# Patient Record
Sex: Male | Born: 1980 | Race: White | Hispanic: No | Marital: Married | Smoking: Never smoker
Health system: Southern US, Community
[De-identification: ages and names within clinical notes are randomized; demographics above are authoritative.]

## PROBLEM LIST (undated history)

## (undated) DIAGNOSIS — B019 Varicella without complication: Secondary | ICD-10-CM

## (undated) DIAGNOSIS — F419 Anxiety disorder, unspecified: Secondary | ICD-10-CM

## (undated) DIAGNOSIS — K760 Fatty (change of) liver, not elsewhere classified: Secondary | ICD-10-CM

## (undated) DIAGNOSIS — Z87442 Personal history of urinary calculi: Secondary | ICD-10-CM

## (undated) DIAGNOSIS — K5790 Diverticulosis of intestine, part unspecified, without perforation or abscess without bleeding: Secondary | ICD-10-CM

## (undated) HISTORY — PX: KNEE ARTHROSCOPY: SHX127

## (undated) HISTORY — DX: Varicella without complication: B01.9

## (undated) HISTORY — PX: CHOLECYSTECTOMY: SHX55

---
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
## 2007-06-21 ENCOUNTER — Emergency Department: Payer: Self-pay | Admitting: Emergency Medicine

## 2007-06-24 ENCOUNTER — Ambulatory Visit: Payer: Self-pay | Admitting: Unknown Physician Specialty

## 2009-02-06 ENCOUNTER — Ambulatory Visit: Payer: Self-pay | Admitting: Pain Medicine

## 2009-03-29 ENCOUNTER — Ambulatory Visit: Payer: Self-pay | Admitting: Unknown Physician Specialty

## 2011-10-09 ENCOUNTER — Emergency Department: Payer: Self-pay | Admitting: Emergency Medicine

## 2014-12-19 ENCOUNTER — Encounter (INDEPENDENT_AMBULATORY_CARE_PROVIDER_SITE_OTHER): Payer: Self-pay

## 2014-12-19 ENCOUNTER — Encounter: Payer: Self-pay | Admitting: Primary Care

## 2014-12-19 ENCOUNTER — Ambulatory Visit (INDEPENDENT_AMBULATORY_CARE_PROVIDER_SITE_OTHER): Payer: 59 | Admitting: Primary Care

## 2014-12-19 VITALS — BP 142/102 | HR 78 | Temp 98.2°F | Ht 67.5 in | Wt 167.1 lb

## 2014-12-19 DIAGNOSIS — J302 Other seasonal allergic rhinitis: Secondary | ICD-10-CM

## 2014-12-19 NOTE — Progress Notes (Signed)
Subjective:    Patient ID: Carl Moore, male    DOB: [birth year], 34 y.o.   MRN: [id number]  HPI  Carl Moore is a 34 year old male who presents today to establish care and discuss the problems mentioned below. Last physical was in August 2015 at a health fair. He has not received care from a PCP in several years and does not recall where current records are located.  1) Elevated blood pressure reading: Elevated blood pressure today in office. Denies prior history. He checks his blood pressure at [facility] sporadically and will get 120's-130's/80. He admits to feeling nervous in clinics and medical institutes.   2) Seasonal Allergies: History of seasonal allergies and works outdoors as a Development worker, international aid. He reports chest congestion that has been present for the past 2 weeks. He's been taking Zyrtec-D with some relief but without resolve of symptoms. Reports productive cough in the morning with green sputum which turns to clear sputum throughout the day. He is a non smoker.  He's tried Claritin and Allegra in the past without help. He's not tried taking Mucinex.  3) Depression: Has felt depressed since the passing of his father in March 2015. He's able to talk with his mom and wife about his feelings. Denies SI/HI. PHQ 2 score of 0. He declines therapy.   Review of Systems  Constitutional: Negative for fever and chills.  HENT: Positive for congestion and postnasal drip. Negative for ear pain, rhinorrhea, sinus pressure, sneezing and sore throat.   Eyes: Positive for itching.  Respiratory: Negative for shortness of breath.   Cardiovascular: Negative for chest pain.  Gastrointestinal: Negative for nausea and vomiting.  Allergic/Immunologic: Positive for environmental allergies.       Past Medical History  Diagnosis Date  . Chicken pox     History   Social History  . Marital Status: Married    Spouse Name: N/A  . Number of Children: N/A  . Years of Education: N/A    Occupational History  . Not on file.   Social History Main Topics  . Smoking status: Never Smoker   . Smokeless tobacco: Not on file  . Alcohol Use: No  . Drug Use: No  . Sexual Activity: Not on file   Other Topics Concern  . 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   No children.   Enjoys playing golf.       Past Surgical History  Procedure Laterality Date  . Knee arthroscopy Left   . Ulnar nerve repair Left     Family History  Problem Relation Age of Onset  . Hypertension Mother   . Stroke Father     Deceased  . Hypertension Father     Allergies not on file  No current outpatient prescriptions on file prior to visit.   No current facility-administered medications on file prior to visit.    BP 142/102 mmHg  Pulse 78  Temp(Src) 98.2 F (36.8 C) (Oral)  Ht 5' 7.5" (1.715 m)  Wt 167 lb 1.9 oz (75.805 kg)  BMI 25.77 kg/m2  SpO2 99%    Objective:   Physical Exam  Constitutional: He is oriented to person, place, and time. He appears well-nourished.  HENT:  Right Ear: Tympanic membrane and ear canal normal.  Left Ear: Tympanic membrane and ear canal normal.  Nose: Nose normal.  Mouth/Throat: Oropharynx is clear and moist.  Eyes: Conjunctivae and EOM are normal.  Pupils are equal, round, and reactive to light.  Neck: Neck supple.  Cardiovascular: Normal rate and regular rhythm.   Pulmonary/Chest: Effort normal and breath sounds normal.  Abdominal: Bowel sounds are normal.  Lymphadenopathy:    He has no cervical adenopathy.  Neurological: He is alert and oriented to person, place, and time. He has normal reflexes.  Skin: Skin is warm and dry.  Psychiatric: He has a normal mood and affect.          Assessment & Plan:  Chest congestion:  Suspect allergy related due to lack of other systemic symptoms. Stop Zyrtec D, switch to allergra, zyrtec, or claritin. Start Mucinex DM with water. Call if no  improvement or development of fevers, chills, cough.

## 2014-12-19 NOTE — Patient Instructions (Signed)
Your blood pressure was elevated in our clinic today at 142/90. Continue checking your blood pressure at [facility] and call me if you consistently (more than 2 times) get anything over 140/90. Try switching from Zyrtec-D to Zyrtec. Take Mucinex DM over the counter for chest congestion. Take with a full glass of water. Please schedule a physical with me in August this year. You will also schedule a lab only appointment one week prior. We will discuss your lab results during your physical. It was a pleasure to meet you today! Please don't hesitate to call me with any questions. Welcome to [facility]!

## 2014-12-19 NOTE — Assessment & Plan Note (Signed)
Present every spring/summer. 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 Chest congestion today. Start Mucinex DM with water. Stop Zyrtec D, start plain Zyrtec/Allegra/Claritin daily during seasonal changes.

## 2014-12-19 NOTE — Progress Notes (Signed)
Pre visit review using our clinic review tool, if applicable. No additional management support is needed unless otherwise documented below in the visit note. 

## 2014-12-28 ENCOUNTER — Ambulatory Visit (INDEPENDENT_AMBULATORY_CARE_PROVIDER_SITE_OTHER): Payer: 59 | Admitting: Primary Care

## 2014-12-28 ENCOUNTER — Encounter: Payer: Self-pay | Admitting: Primary Care

## 2014-12-28 VITALS — BP 152/72 | HR 78 | Temp 98.5°F | Ht 67.5 in | Wt 162.4 lb

## 2014-12-28 DIAGNOSIS — R5383 Other fatigue: Secondary | ICD-10-CM

## 2014-12-28 LAB — COMPREHENSIVE METABOLIC PANEL
ALBUMIN: 4.7 g/dL (ref 3.5–5.2)
ALT: 71 U/L — AB (ref 0–53)
AST: 43 U/L — AB (ref 0–37)
Alkaline Phosphatase: 98 U/L (ref 39–117)
BUN: 13 mg/dL (ref 6–23)
CALCIUM: 9.9 mg/dL (ref 8.4–10.5)
CO2: 32 mEq/L (ref 19–32)
CREATININE: 1.14 mg/dL (ref 0.40–1.50)
Chloride: 101 mEq/L (ref 96–112)
GFR: 77.44 mL/min (ref >60.00)
Glucose, Bld: 102 mg/dL — ABNORMAL HIGH (ref 70–99)
POTASSIUM: 4.1 meq/L (ref 3.5–5.1)
SODIUM: 138 meq/L (ref 135–145)
Total Bilirubin: 0.6 mg/dL (ref 0.2–1.2)
Total Protein: 7.7 g/dL (ref 6.0–8.3)

## 2014-12-28 LAB — CBC WITH DIFFERENTIAL/PLATELET
Basophils Absolute: 0 10*3/uL (ref 0.0–0.1)
Basophils Relative: 0.4 % (ref 0.0–3.0)
Eosinophils Absolute: 0.1 10*3/uL (ref 0.0–0.7)
Eosinophils Relative: 2 % (ref 0.0–5.0)
HCT: 48.8 % (ref 39.0–52.0)
Hemoglobin: 16.5 g/dL (ref 13.0–17.0)
LYMPHS ABS: 1.2 10*3/uL (ref 0.7–4.0)
Lymphocytes Relative: 18.9 % (ref 12.0–46.0)
MCHC: 33.8 g/dL (ref 30.0–36.0)
MCV: 86.9 fl (ref 78.0–100.0)
Monocytes Absolute: 0.4 10*3/uL (ref 0.1–1.0)
Monocytes Relative: 6.3 % (ref 3.0–12.0)
NEUTROS PCT: 72.4 % (ref 43.0–77.0)
Neutro Abs: 4.8 10*3/uL (ref 1.4–7.7)
Platelets: 368 10*3/uL (ref 150.0–400.0)
RBC: 5.62 Mil/uL (ref 4.22–5.81)
RDW: 12.6 % (ref 11.5–15.5)
WBC: 6.6 10*3/uL (ref 4.0–10.5)

## 2014-12-28 NOTE — Patient Instructions (Addendum)
Your influenza test was negative. You need to increase your daily consumption of water. You should be getting 3 liters daily.  You may take tylenol for fevers and body aches. Get some rest. Complete lab work prior to leaving today. I will notify you of your results.

## 2014-12-28 NOTE — Progress Notes (Signed)
   Subjective:    Patient ID: Carl Moore, male    DOB: 02-19-1981, 34 y.o.   MRN: [id number]  HPI  Carl Moore is a 34 year old male who presents today with a chief complaint of sudden onset of fatigue and body aches that occurred around 11:00 am at work yesterday. He reports body aches and chills have continued but has not checked his temerature. He received the influenza vaccine last season. 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 He's taken tylenol yesterday which improved his body aches. He denies cough, sore throat, ear pain, sinus pressure.   Review of Systems  Constitutional: Positive for chills and diaphoresis. Negative for fever.  HENT: Negative for congestion, ear pain, postnasal drip and sore throat.   Respiratory: Negative for cough and shortness of breath.   Cardiovascular: Negative for chest pain.  Musculoskeletal: Positive for myalgias.  Neurological: Positive for light-headedness. Negative for headaches.       Past Medical History  Diagnosis Date  . Chicken pox     History   Social History  . Marital Status: Married    Spouse Name: N/A  . Number of Children: N/A  . Years of Education: N/A   Occupational History  . Not on file.   Social History Main Topics  . Smoking status: Never Smoker   . Smokeless tobacco: Not on file  . Alcohol Use: No  . Drug Use: No  . Sexual Activity: Not on file   Other Topics Concern  . 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   No children.   Enjoys playing golf.       Past Surgical History  Procedure Laterality Date  . Knee arthroscopy Left   . Ulnar nerve repair Left     Family History  Problem Relation Age of Onset  . Hypertension Mother   . Stroke Father     Deceased  . Hypertension Father     No Known Allergies  No current outpatient prescriptions on file prior to visit.   No current facility-administered medications on file  prior to visit.    BP 152/72 mmHg  Pulse 78  Temp(Src) 98.5 F (36.9 C) (Oral)  Ht 5' 7.5" (1.715 m)  Wt 162 lb 6.4 oz (73.664 kg)  BMI 25.05 kg/m2  SpO2 98%    Objective:   Physical Exam  Constitutional: He appears well-nourished. He appears ill.  HENT:  Right Ear: Tympanic membrane and ear canal normal.  Left Ear: Tympanic membrane and ear canal normal.  Nose: Right sinus exhibits no maxillary sinus tenderness and no frontal sinus tenderness. Left sinus exhibits no maxillary sinus tenderness and no frontal sinus tenderness.  Mouth/Throat: Oropharynx is clear and moist.  Neck: Neck supple.  Cardiovascular: Normal rate and regular rhythm.   Pulmonary/Chest: Effort normal and breath sounds normal.  Lymphadenopathy:    He has no cervical adenopathy.  Skin: Skin is warm and dry.          Assessment & Plan:  Fatigue:  Suspect viral involvement. Influenza test: Negative Increase consumption of water  Tylenol for body aches and fevers. Rest. CBC and CMP today to rule out other disorders.

## 2014-12-28 NOTE — Progress Notes (Signed)
Pre visit review using our clinic review tool, if applicable. No additional management support is needed unless otherwise documented below in the visit note. 

## 2015-01-05 ENCOUNTER — Other Ambulatory Visit: Payer: Self-pay | Admitting: Occupational Medicine

## 2015-01-05 ENCOUNTER — Ambulatory Visit: Payer: Self-pay

## 2015-01-05 DIAGNOSIS — R208 Other disturbances of skin sensation: Secondary | ICD-10-CM

## 2015-03-10 ENCOUNTER — Other Ambulatory Visit: Payer: Self-pay | Admitting: Primary Care

## 2015-03-10 DIAGNOSIS — Z0000 Encounter for general adult medical examination without abnormal findings: Secondary | ICD-10-CM

## 2015-03-16 ENCOUNTER — Other Ambulatory Visit (INDEPENDENT_AMBULATORY_CARE_PROVIDER_SITE_OTHER): Payer: 59

## 2015-03-16 DIAGNOSIS — Z0000 Encounter for general adult medical examination without abnormal findings: Secondary | ICD-10-CM | POA: Diagnosis not present

## 2015-03-16 LAB — LIPID PANEL
CHOL/HDL RATIO: 4
CHOLESTEROL: 191 mg/dL (ref 0–200)
HDL: 43.1 mg/dL (ref >39.00)
LDL Cholesterol: 124 mg/dL — ABNORMAL HIGH (ref 0–99)
NonHDL: 148.1
TRIGLYCERIDES: 120 mg/dL (ref 0.0–149.0)
VLDL: 24 mg/dL (ref 0.0–40.0)

## 2015-03-16 LAB — COMPREHENSIVE METABOLIC PANEL
ALBUMIN: 4.5 g/dL (ref 3.5–5.2)
ALT: 38 U/L (ref 0–53)
AST: 23 U/L (ref 0–37)
Alkaline Phosphatase: 91 U/L (ref 39–117)
BUN: 16 mg/dL (ref 6–23)
CO2: 30 meq/L (ref 19–32)
Calcium: 9.5 mg/dL (ref 8.4–10.5)
Chloride: 104 mEq/L (ref 96–112)
Creatinine, Ser: 1.05 mg/dL (ref 0.40–1.50)
GFR: 85.04 mL/min (ref >60.00)
Glucose, Bld: 98 mg/dL (ref 70–99)
Potassium: 4.6 mEq/L (ref 3.5–5.1)
Sodium: 139 mEq/L (ref 135–145)
Total Bilirubin: 0.6 mg/dL (ref 0.2–1.2)
Total Protein: 7.3 g/dL (ref 6.0–8.3)

## 2015-03-16 LAB — CBC
HEMATOCRIT: 46.3 % (ref 39.0–52.0)
HEMOGLOBIN: 15.8 g/dL (ref 13.0–17.0)
MCHC: 34 g/dL (ref 30.0–36.0)
MCV: 87.4 fl (ref 78.0–100.0)
Platelets: 349 10*3/uL (ref 150.0–400.0)
RBC: 5.3 Mil/uL (ref 4.22–5.81)
RDW: 13 % (ref 11.5–15.5)
WBC: 6 10*3/uL (ref 4.0–10.5)

## 2015-03-16 LAB — HEMOGLOBIN A1C: Hgb A1c MFr Bld: 5.2 % (ref 4.6–6.5)

## 2015-03-23 ENCOUNTER — Encounter: Payer: 59 | Admitting: Primary Care

## 2015-03-24 ENCOUNTER — Ambulatory Visit (INDEPENDENT_AMBULATORY_CARE_PROVIDER_SITE_OTHER): Payer: 59 | Admitting: Primary Care

## 2015-03-24 ENCOUNTER — Encounter: Payer: Self-pay | Admitting: Primary Care

## 2015-03-24 VITALS — BP 126/76 | HR 61 | Temp 98.0°F | Ht 68.0 in | Wt 159.8 lb

## 2015-03-24 DIAGNOSIS — Z0000 Encounter for general adult medical examination without abnormal findings: Secondary | ICD-10-CM | POA: Diagnosis not present

## 2015-03-24 DIAGNOSIS — Z23 Encounter for immunization: Secondary | ICD-10-CM | POA: Diagnosis not present

## 2015-03-24 NOTE — Addendum Note (Signed)
Addended by: Jacqualin Combes on: 03/24/2015 08:06 AM   Modules accepted: Orders

## 2015-03-24 NOTE — Progress Notes (Signed)
Subjective:    Patient ID: Carl Moore, male    DOB: 15-Jan-1981, 34 y.o.   MRN: [id number]  HPI  Carl Moore is a 34 year old male who presents today for complete physical.  Immunizations: -Tetanus: Completed last year (2015) from the [place] -Influenza: Completed last season. Due today.   Diet: Endorses poor diet. Breakfast: Biscuit (bacon), fast food. Lunch: Grilled chicken, fast food (hamburger, chicken nuggets, fries) Dinner: Pizza, chicken (baked), steaks, limited vegetables. Snack: Ice cream, gummy bears, chips. Beverages: Drinks sweet tea and water. Limited soda  Exercise: He exercises in the gym daily and walks daily for his job. Eye exam: 2 years ago. Denies changes in vision. Dental exam: Completed 2 months ago.    Review of Systems  Constitutional: Negative for unexpected weight change.  HENT: Negative for rhinorrhea.   Respiratory: Negative for cough and shortness of breath.   Cardiovascular: Negative for chest pain.  Gastrointestinal: Negative for diarrhea and constipation.  Genitourinary: Negative for difficulty urinating.  Musculoskeletal: Negative for myalgias and arthralgias.  Skin: Negative for rash.  Neurological: Negative for dizziness, numbness and headaches.  Psychiatric/Behavioral:       Denies concerns for anxiety and depression       Past Medical History  Diagnosis Date  . Chicken pox     Social History   Social History  . Marital Status: Married    Spouse Name: N/A  . Number of Children: N/A  . Years of Education: N/A   Occupational History  . Not on file.   Social History Main Topics  . Smoking status: Never Smoker   . Smokeless tobacco: Not on file  . Alcohol Use: No  . Drug Use: No  . Sexual Activity: Not on file   Other Topics Concern  . 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   No children.   Enjoys playing golf.       Past Surgical  History  Procedure Laterality Date  . Knee arthroscopy Left   . Ulnar nerve repair Left     Family History  Problem Relation Age of Onset  . Hypertension Mother   . Stroke Father     Deceased  . Hypertension Father     No Known Allergies  No current outpatient prescriptions on file prior to visit.   No current facility-administered medications on file prior to visit.    BP 126/76 mmHg  Pulse 61  Temp(Src) 98 F (36.7 C) (Oral)  Ht 5\' 8"  (1.727 m)  Wt 159 lb 12.8 oz (72.485 kg)  BMI 24.30 kg/m2  SpO2 98%    Objective:   Physical Exam  Constitutional: He is oriented to person, place, and time. He appears well-nourished.  HENT:  Nose: Nose normal.  Mouth/Throat: Oropharynx is clear and moist.  Bilateral ears compacted with cerumen. Irrigation used to right canal, plastic curette used to left canal. TM's clear with visualization of BLM, positive light reflex post cerumen removal.  Eyes: Conjunctivae and EOM are normal. Pupils are equal, round, and reactive to light.  Neck: Neck supple.  Cardiovascular: Normal rate and regular rhythm.   Pulmonary/Chest: Effort normal and breath sounds normal.  Abdominal: Soft. Bowel sounds are normal. There is no tenderness.  Musculoskeletal: Normal range of motion.  Lymphadenopathy:    He has no cervical adenopathy.  Neurological: He is alert and oriented to person, place, and time. He has  normal reflexes. No cranial nerve deficit.  Skin: Skin is warm and dry.  Psychiatric: He has a normal mood and affect.          Assessment & Plan:

## 2015-03-24 NOTE — Patient Instructions (Signed)
You've been provided with a flu shot for this season.  Work to decrease fast food, fried foods, red meats.  Increase water consumption to 2 liters daily.  Follow up in 1 year or sooner if needed.  It was a pleasure to see you today!

## 2015-03-24 NOTE — Progress Notes (Signed)
Pre visit review using our clinic review tool, if applicable. No additional management support is needed unless otherwise documented below in the visit note. 

## 2015-03-24 NOTE — Assessment & Plan Note (Signed)
Tetanus UTD, flu provided today. Diet poor overall, discussed to reduce fast food/fried foods. Continue exercising. Labs mostly unremarkable, LDL borderline elevated. Exam unremarkable. Follow up in 1 year.

## 2015-04-13 ENCOUNTER — Encounter: Payer: Self-pay | Admitting: Primary Care

## 2015-04-13 ENCOUNTER — Ambulatory Visit (INDEPENDENT_AMBULATORY_CARE_PROVIDER_SITE_OTHER): Payer: 59 | Admitting: Primary Care

## 2015-04-13 ENCOUNTER — Telehealth: Payer: Self-pay | Admitting: Primary Care

## 2015-04-13 VITALS — BP 132/80 | HR 61 | Temp 97.8°F | Ht 68.0 in | Wt 164.0 lb

## 2015-04-13 DIAGNOSIS — R5383 Other fatigue: Secondary | ICD-10-CM

## 2015-04-13 LAB — CBC WITH DIFFERENTIAL/PLATELET
BASOS ABS: 0 10*3/uL (ref 0.0–0.1)
Basophils Relative: 0.5 % (ref 0.0–3.0)
EOS ABS: 0.2 10*3/uL (ref 0.0–0.7)
Eosinophils Relative: 2.8 % (ref 0.0–5.0)
HCT: 47.8 % (ref 39.0–52.0)
Hemoglobin: 16.3 g/dL (ref 13.0–17.0)
LYMPHS ABS: 1.3 10*3/uL (ref 0.7–4.0)
Lymphocytes Relative: 19.3 % (ref 12.0–46.0)
MCHC: 34.2 g/dL (ref 30.0–36.0)
MCV: 86.8 fl (ref 78.0–100.0)
Monocytes Absolute: 0.3 10*3/uL (ref 0.1–1.0)
Monocytes Relative: 4.8 % (ref 3.0–12.0)
NEUTROS ABS: 4.9 10*3/uL (ref 1.4–7.7)
NEUTROS PCT: 72.6 % (ref 43.0–77.0)
PLATELETS: 370 10*3/uL (ref 150.0–400.0)
RBC: 5.51 Mil/uL (ref 4.22–5.81)
RDW: 12.6 % (ref 11.5–15.5)
WBC: 6.8 10*3/uL (ref 4.0–10.5)

## 2015-04-13 LAB — MONONUCLEOSIS SCREEN: Mono Screen: NEGATIVE

## 2015-04-13 NOTE — Progress Notes (Signed)
Subjective:    Patient ID: Carl Moore, male    DOB: 23-Jan-1981, 34 y.o.   MRN: [id number]  HPI  Carl Moore is a 34 year old male who presents today with a chief complaint of fatigue. He's been feeling tired, sleeping, and has not felt physically able to work. His fatigue began on Tuesday and has remained throughout the week. He also reports a decrease in appetite. Denies cough, fevers, sore throat, weight loss. Overall his symptoms are about the same.   Review of Systems  Constitutional: Positive for appetite change. Negative for fever and chills.  HENT: Positive for postnasal drip. Negative for congestion, ear pain, sinus pressure and sore throat.   Respiratory: Negative for cough and shortness of breath.   Cardiovascular: Negative for chest pain.  Gastrointestinal: Negative for nausea, vomiting and abdominal pain.  Musculoskeletal: Negative for myalgias.  Neurological: Negative for dizziness and headaches.       Past Medical History  Diagnosis Date  . Chicken pox     Social History   Social History  . Marital Status: Married    Spouse Name: N/A  . Number of Children: N/A  . Years of Education: N/A   Occupational History  . Not on file.   Social History Main Topics  . Smoking status: Never Smoker   . Smokeless tobacco: Not on file  . Alcohol Use: No  . Drug Use: No  . Sexual Activity: Not on file   Other Topics Concern  . 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   No children.   Enjoys playing golf.       Past Surgical History  Procedure Laterality Date  . Knee arthroscopy Left   . Ulnar nerve repair Left     Family History  Problem Relation Age of Onset  . Hypertension Mother   . Stroke Father     Deceased  . Hypertension Father     No Known Allergies  No current outpatient prescriptions on file prior to visit.   No current facility-administered medications on file prior to visit.    BP  132/80 mmHg  Pulse 61  Temp(Src) 97.8 F (36.6 C) (Oral)  Ht 5\' 8"  (1.727 m)  Wt 164 lb (74.39 kg)  BMI 24.94 kg/m2  SpO2 98%    Objective:   Physical Exam  Constitutional: He is oriented to person, place, and time. He appears well-nourished. He does not appear ill.  HENT:  Right Ear: Tympanic membrane and ear canal normal.  Left Ear: Tympanic membrane and ear canal normal.  Nose: Right sinus exhibits no maxillary sinus tenderness and no frontal sinus tenderness. Left sinus exhibits no maxillary sinus tenderness and no frontal sinus tenderness.  Mouth/Throat: Oropharynx is clear and moist.  Eyes: Conjunctivae are normal. Pupils are equal, round, and reactive to light.  Neck: Neck supple.  Cardiovascular: Normal rate and regular rhythm.   Pulmonary/Chest: Effort normal and breath sounds normal.  Lymphadenopathy:    He has no cervical adenopathy.  Neurological: He is alert and oriented to person, place, and time.  Skin: Skin is warm and dry.          Assessment & Plan:  Fatigue:  Present since Tuesday this week, continues. No cough, fevers, body aches, sore throat, abdominal pain, rash. Exam unremarkable.  Will check CBC with diff and monospot to rule out other causes. Rest, fluids, tylenol if develop fevers or body  aches. Follow up if no improvement in 1 week.

## 2015-04-13 NOTE — Progress Notes (Signed)
Pre visit review using our clinic review tool, if applicable. No additional management support is needed unless otherwise documented below in the visit note. 

## 2015-04-13 NOTE — Patient Instructions (Signed)
Complete lab work prior to leaving today. I will notify you of your results.  Ensure that you are drinking plenty of water, about 2 liters daily. Rest.   You may take tylenol if you develop any body aches or fevers. Call me if no improvement in 1 week.   It was a pleasure to see you today!

## 2015-04-13 NOTE — Telephone Encounter (Signed)
Patient had lab work done this morning and he'd like to be called back with the results.

## 2015-04-13 NOTE — Telephone Encounter (Signed)
Please notify Carl Moore that he does not have Mono or signs of bacterial or viral infections. He is to rest and stay hydrated over the next several days. If he's had no improvement in one week, please have him call us. Thanks.

## 2015-04-14 NOTE — Telephone Encounter (Signed)
Called and notified patient of Kate's comments. Patient verbalized understanding.  

## 2015-05-14 ENCOUNTER — Encounter (HOSPITAL_COMMUNITY): Payer: Self-pay

## 2015-05-14 ENCOUNTER — Emergency Department (HOSPITAL_COMMUNITY)
Admission: EM | Admit: 2015-05-14 | Discharge: 2015-05-15 | Disposition: A | Discharge status: Home / Self Care | Payer: 59 | Attending: Emergency Medicine | Admitting: Emergency Medicine

## 2015-05-14 DIAGNOSIS — Z8619 Personal history of other infectious and parasitic diseases: Secondary | ICD-10-CM | POA: Diagnosis not present

## 2015-05-14 DIAGNOSIS — N201 Calculus of ureter: Secondary | ICD-10-CM | POA: Diagnosis not present

## 2015-05-14 DIAGNOSIS — R1031 Right lower quadrant pain: Secondary | ICD-10-CM | POA: Diagnosis present

## 2015-05-14 DIAGNOSIS — R109 Unspecified abdominal pain: Secondary | ICD-10-CM

## 2015-05-14 LAB — COMPREHENSIVE METABOLIC PANEL
ALBUMIN: 4.3 g/dL (ref 3.5–5.0)
ALT: 34 U/L (ref 17–63)
ANION GAP: 8 (ref 5–15)
AST: 25 U/L (ref 15–41)
Alkaline Phosphatase: 88 U/L (ref 38–126)
BUN: 12 mg/dL (ref 6–20)
CHLORIDE: 97 mmol/L — AB (ref 101–111)
CO2: 30 mmol/L (ref 22–32)
Calcium: 9.2 mg/dL (ref 8.9–10.3)
Creatinine, Ser: 0.94 mg/dL (ref 0.61–1.24)
GFR calc Af Amer: >60 mL/min (ref >60)
GLUCOSE: 112 mg/dL — AB (ref 65–99)
POTASSIUM: 3.7 mmol/L (ref 3.5–5.1)
Sodium: 135 mmol/L (ref 135–145)
TOTAL PROTEIN: 7.1 g/dL (ref 6.5–8.1)
Total Bilirubin: 0.4 mg/dL (ref 0.3–1.2)

## 2015-05-14 LAB — URINALYSIS, ROUTINE W REFLEX MICROSCOPIC
BILIRUBIN URINE: NEGATIVE
Glucose, UA: NEGATIVE mg/dL
Ketones, ur: NEGATIVE mg/dL
LEUKOCYTES UA: NEGATIVE
NITRITE: NEGATIVE
PH: 6 (ref 5.0–8.0)
Protein, ur: NEGATIVE mg/dL
SPECIFIC GRAVITY, URINE: 1.013 (ref 1.005–1.030)
UROBILINOGEN UA: 0.2 mg/dL (ref 0.0–1.0)

## 2015-05-14 LAB — CBC
HEMATOCRIT: 43.4 % (ref 39.0–52.0)
HEMOGLOBIN: 15.2 g/dL (ref 13.0–17.0)
MCH: 30.3 pg (ref 26.0–34.0)
MCHC: 35 g/dL (ref 30.0–36.0)
MCV: 86.6 fL (ref 78.0–100.0)
Platelets: 348 10*3/uL (ref 150–400)
RBC: 5.01 MIL/uL (ref 4.22–5.81)
RDW: 12.3 % (ref 11.5–15.5)
WBC: 7.9 10*3/uL (ref 4.0–10.5)

## 2015-05-14 LAB — URINE MICROSCOPIC-ADD ON

## 2015-05-14 LAB — LIPASE, BLOOD: LIPASE: 28 U/L (ref 22–51)

## 2015-05-14 MED ORDER — ONDANSETRON HCL 4 MG/2ML IJ SOLN
4.0000 mg | Freq: Once | INTRAMUSCULAR | Status: AC
  Administered 2015-05-15: 4 mg via INTRAVENOUS
  Filled 2015-05-14: qty 2

## 2015-05-14 MED ORDER — HYDROMORPHONE HCL 1 MG/ML IJ SOLN
1.0000 mg | Freq: Once | INTRAMUSCULAR | Status: AC
  Administered 2015-05-15: 1 mg via INTRAVENOUS
  Filled 2015-05-14: qty 1

## 2015-05-14 NOTE — ED Notes (Signed)
Onset 8:30p sudden RLQ pain.  No other s/s noted.

## 2015-05-14 NOTE — ED Provider Notes (Signed)
CSN: [id number]   Arrival date & time 05/14/15 2146  History  By signing my name below, I, Altamease Oiler, attest that this documentation has been prepared under the direction and in the presence of Ripley Fraise, MD. Electronically Signed: Altamease Oiler, ED Scribe. 05/15/2015. 12:12 AM.  Chief Complaint  Patient presents with  . Abdominal Pain    HPI Patient is a 34 y.o. male presenting with groin pain. The history is provided by the patient. No language interpreter was used.  Groin Pain This is a new problem. The current episode started 3 to 5 hours ago. The problem occurs constantly. The problem has not changed since onset.Associated symptoms include abdominal pain. Nothing aggravates the symptoms. Nothing relieves the symptoms. He has tried nothing for the symptoms.   BOE DEANS is a 34 y.o. male who presents to the Emergency Department complaining of constant right groin pain with sudden onset around 8:30 PM tonight. He notes that the pain starts in the right side of the groin and radiates to the RLQ. The pain is described as sharp and rated 8/10 in severity. Pt took nothing for pain PTA. He had similar pain in the past with kidney stones.  Associated symptoms include chills. Pt denies fever, nausea, vomiting, and difficulty with urination.   Past Medical History  Diagnosis Date  . Chicken pox     Past Surgical History  Procedure Laterality Date  . Knee arthroscopy Left   . Ulnar nerve repair Left     Family History  Problem Relation Age of Onset  . Hypertension Mother   . Stroke Father     Deceased  . Hypertension Father     Social History  Substance Use Topics  . Smoking status: Never Smoker   . Smokeless tobacco: None  . Alcohol Use: No     Review of Systems  Constitutional: Positive for chills. Negative for fever.  Gastrointestinal: Positive for abdominal pain. Negative for nausea and vomiting.  Genitourinary:       Right-sided groin pain  All other  systems reviewed and are negative.  Home Medications   Prior to Admission medications   Not on File    Allergies  Review of patient's allergies indicates no known allergies.  Triage Vitals: BP 143/84 mmHg  Pulse 82  Temp(Src) 97.9 F (36.6 C) (Oral)  Resp 16  Ht 5\' 6"  (1.676 m)  Wt 164 lb (74.39 kg)  BMI 26.48 kg/m2  SpO2 100%  Physical Exam CONSTITUTIONAL: Well developed/well nourished HEAD: Normocephalic/atraumatic EYES: EOMI/PERRL ENMT: Mucous membranes moist NECK: supple no meningeal signs SPINE/BACK:entire spine nontender CV: S1/S2 noted, no murmurs/rubs/gallops noted LUNGS: Lungs are clear to auscultation bilaterally, no apparent distress ABDOMEN: soft, nontender, no rebound or guarding, bowel sounds noted throughout abdomen GU:no cva tenderness, mild TTP to RLQ, no scrotal tenderness or edema, no Testicular tenderness, no hernia noted, chaperone present NEURO: Pt is awake/alert/appropriate, moves all extremitiesx4.  No facial droop.   EXTREMITIES: pulses normal/equal, full ROM SKIN: warm, color normal PSYCH: no abnormalities of mood noted, alert and oriented to situation  ED Course  Procedures Medications  ondansetron (ZOFRAN-ODT) disintegrating tablet 8 mg (8 mg Oral Not Given 05/15/15 0135)  HYDROmorphone (DILAUDID) injection 1 mg (not administered)  HYDROmorphone (DILAUDID) injection 1 mg (1 mg Intravenous Given 05/15/15 0057)  ondansetron (ZOFRAN) injection 4 mg (4 mg Intravenous Given 05/15/15 0057)  oxyCODONE-acetaminophen (PERCOCET/ROXICET) 5-325 MG per tablet 2 tablet (2 tablets Oral Given 05/15/15 0134)  ketorolac (TORADOL) 30 MG/ML injection  30 mg (30 mg Intravenous Given 05/15/15 0134)     DIAGNOSTIC STUDIES: Oxygen Saturation is 100% on RA, normal by my interpretation.    COORDINATION OF CARE: 11:48 PM Discussed treatment plan which includes lab work and pain management with pt at bedside and pt agreed to plan. 2:01 AM Pt with sharp low abd  pain that radiates to scrotum, reports similar to prior kidney stones After multiple meds, he is still in pain CT imaging ordered  Pt improved He would like to go home Will give pain meds and flomax Given urology info for f/u We discussed strict return precautions  Labs Reviewed  COMPREHENSIVE METABOLIC PANEL - Abnormal; Notable for the following:    Chloride 97 (*)    Glucose, Bld 112 (*)    All other components within normal limits  URINALYSIS, ROUTINE W REFLEX MICROSCOPIC (NOT AT [facility]) - Abnormal; Notable for the following:    Hgb urine dipstick LARGE (*)    All other components within normal limits  URINE MICROSCOPIC-ADD ON - Abnormal; Notable for the following:    Crystals CA OXALATE CRYSTALS (*)    All other components within normal limits  LIPASE, BLOOD  CBC    Imaging Review Ct Renal Stone Study  05/15/2015  CLINICAL DATA:  Abdominal and right groin pain for 7 hr. Hematuria. History of kidney stones. EXAM: CT ABDOMEN AND PELVIS WITHOUT CONTRAST TECHNIQUE: Multidetector CT imaging of the abdomen and pelvis was performed following the standard protocol without IV contrast. COMPARISON:  None. FINDINGS: Lower chest:  The included lung bases are clear. Liver: No evidence of focal lesion allowing for lack contrast. Hepatobiliary: Gallbladder is physiologically distended. No biliary dilatation. Pancreas: Normal. Spleen: Normal. Adrenal glands: No nodule. Kidneys: There is a 3 x 7 mm stone in the distal right ureter just proximal to the ureterovesicular junction. No associated hydroureteronephrosis of the right kidney. No additional nonobstructing stones in either kidney. The unenhanced left kidney appears normal. Stomach/Bowel: Stomach is distended with ingested contents. There are no dilated or thickened small bowel loops. Small volume of stool throughout the colon without colonic wall thickening. Diverticulosis of the sigmoid colon without diverticulitis. The appendix is normal.  Vascular/Lymphatic: No retroperitoneal adenopathy. Abdominal aorta is normal in caliber. Reproductive: Prostate gland is normal in size. Bladder: Physiologically distended without wall thickening or stone. Other: No free air, free fluid, or intra-abdominal fluid collection. No inguinal hernia. Musculoskeletal: Bilateral L5 pars interarticularis defects without listhesis. Vacuum phenomenon involving both sacroiliac joints. There are no acute or suspicious osseous abnormalities. IMPRESSION: 1. Right distal ureter 3 x 7 mm stone, however no back pressure changes or hydronephrosis. This may be partially or intermittently obstructing stone. 2. 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 Electronically Signed   By: Jeb Levering M.D.   On: 05/15/2015 02:37    I personally reviewed and evaluated these lab results as a part of my medical decision-making.    MDM   Final diagnoses:  Abdominal pain  Right ureteral stone     Nursing notes including past medical history and social history reviewed and considered in documentation Labs/vital reviewed myself and considered during evaluation   I, Sharyon Cable, personally performed the services described in this documentation. All medical record entries made by the scribe were at my direction and in my presence.  I have reviewed the chart and discharge instructions and agree that the record reflects my personal performance and is accurate and complete. Sharyon Cable.  05/15/2015.  4:54 AM.      Ripley Fraise, MD 05/15/15 [phone]

## 2015-05-15 ENCOUNTER — Emergency Department (HOSPITAL_COMMUNITY): Payer: 59

## 2015-05-15 MED ORDER — ONDANSETRON 4 MG PO TBDP: 8.0000 mg | ORAL_TABLET | Freq: Once | ORAL | Status: DC

## 2015-05-15 MED ORDER — TAMSULOSIN HCL 0.4 MG PO CAPS: 0.4000 mg | ORAL_CAPSULE | Freq: Every day | ORAL | Status: DC

## 2015-05-15 MED ORDER — ONDANSETRON 8 MG PO TBDP: ORAL_TABLET | ORAL | Status: DC

## 2015-05-15 MED ORDER — OXYCODONE-ACETAMINOPHEN 5-325 MG PO TABS
2.0000 | ORAL_TABLET | Freq: Once | ORAL | Status: AC
  Administered 2015-05-15: 2 via ORAL
  Filled 2015-05-15: qty 2

## 2015-05-15 MED ORDER — OXYCODONE-ACETAMINOPHEN 5-325 MG PO TABS: 2.0000 | ORAL_TABLET | ORAL | Status: DC | PRN

## 2015-05-15 MED ORDER — ONDANSETRON HCL 4 MG/2ML IJ SOLN
4.0000 mg | Freq: Once | INTRAMUSCULAR | Status: AC
  Administered 2015-05-15: 4 mg via INTRAVENOUS
  Filled 2015-05-15: qty 2

## 2015-05-15 MED ORDER — KETOROLAC TROMETHAMINE 30 MG/ML IJ SOLN
30.0000 mg | Freq: Once | INTRAMUSCULAR | Status: AC
  Administered 2015-05-15: 30 mg via INTRAVENOUS
  Filled 2015-05-15: qty 1

## 2015-05-15 MED ORDER — OXYCODONE-ACETAMINOPHEN 5-325 MG PO TABS: 1.0000 | ORAL_TABLET | ORAL | Status: DC | PRN

## 2015-05-15 MED ORDER — HYDROMORPHONE HCL 1 MG/ML IJ SOLN
1.0000 mg | Freq: Once | INTRAMUSCULAR | Status: AC
  Administered 2015-05-15: 1 mg via INTRAVENOUS
  Filled 2015-05-15: qty 1

## 2015-05-15 NOTE — ED Notes (Signed)
Pt. Left with all belongings and refused wheelchair. Discharge instructions were reviewed and all questions were answered.  

## 2015-05-15 NOTE — ED Notes (Signed)
Patient transported to CT 

## 2015-09-27 ENCOUNTER — Encounter: Payer: Self-pay | Admitting: Primary Care

## 2015-09-27 ENCOUNTER — Ambulatory Visit (INDEPENDENT_AMBULATORY_CARE_PROVIDER_SITE_OTHER): Payer: 59 | Admitting: Primary Care

## 2015-09-27 VITALS — BP 146/80 | HR 98 | Temp 97.9°F | Ht 68.0 in | Wt 170.8 lb

## 2015-09-27 DIAGNOSIS — R5383 Other fatigue: Secondary | ICD-10-CM

## 2015-09-27 LAB — POCT INFLUENZA A/B
INFLUENZA A, POC: NEGATIVE
Influenza B, POC: NEGATIVE

## 2015-09-27 NOTE — Patient Instructions (Signed)
Your flu test was negative, however, your symptoms do represent viral activity.  Nasal Congestion: Try using Flonase (fluticasone) nasal spray. Instill 2 sprays in each nostril once daily.   Cough/Congestion: Try taking Mucinex DM. This will help loosen up the mucous in your chest. Ensure you take this medication with a full glass of water.  Continue tylenol for body aches and headaches.  Ensure you are staying hydrated with water.  Please notify me if you develop persistent fevers of 101 or feel worse after 1 week of onset of symptoms.   It was a pleasure to see you today!  Upper Respiratory Infection, Adult Most upper respiratory infections (URIs) are a viral infection of the air passages leading to the lungs. A URI affects the nose, throat, and upper air passages. The most common type of URI is nasopharyngitis and is typically referred to as "the common cold." URIs run their course and usually go away on their own. Most of the time, a URI does not require medical attention, but sometimes a bacterial infection in the upper airways can follow a viral infection. This is called a secondary infection. Sinus and middle ear infections are common types of secondary upper respiratory infections. Bacterial pneumonia can also complicate a URI. A URI can worsen asthma and chronic obstructive pulmonary disease (COPD). Sometimes, these complications can require emergency medical care and may be life threatening.  CAUSES Almost all URIs are caused by viruses. A virus is a type of germ and can spread from one person to another.  RISKS FACTORS You may be at risk for a URI if:   You smoke.   You have chronic heart or lung disease.  You have a weakened defense (immune) system.   You are very young or very old.   You have nasal allergies or asthma.  You work in crowded or poorly ventilated areas.  You work in health care facilities or schools. SIGNS AND SYMPTOMS  Symptoms typically develop 2-3  days after you come in contact with a cold virus. Most viral URIs last 7-10 days. However, viral URIs from the influenza virus (flu virus) can last 14-18 days and are typically more severe. Symptoms may include:   Runny or stuffy (congested) nose.   Sneezing.   Cough.   Sore throat.   Headache.   Fatigue.   Fever.   Loss of appetite.   Pain in your forehead, behind your eyes, and over your cheekbones (sinus pain).  Muscle aches.  DIAGNOSIS  Your health care provider may diagnose a URI by:  Physical exam.  Tests to check that your symptoms are not due to another condition such as:  Strep throat.  Sinusitis.  Pneumonia.  Asthma. TREATMENT  A URI goes away on its own with time. It cannot be cured with medicines, but medicines may be prescribed or recommended to relieve symptoms. Medicines may help:  Reduce your fever.  Reduce your cough.  Relieve nasal congestion. HOME CARE INSTRUCTIONS   Take medicines only as directed by your health care provider.   Gargle warm saltwater or take cough drops to comfort your throat as directed by your health care provider.  Use a warm mist humidifier or inhale steam from a shower to increase air moisture. This may make it easier to breathe.  Drink enough fluid to keep your urine clear or pale yellow.   Eat soups and other clear broths and maintain good nutrition.   Rest as needed.   Return to work when  your temperature has returned to normal or as your health care provider advises. You may need to stay home longer to avoid infecting others. You can also use a face mask and careful hand washing to prevent spread of the virus.  Increase the usage of your inhaler if you have asthma.   Do not use any tobacco products, including cigarettes, chewing tobacco, or electronic cigarettes. If you need help quitting, ask your health care provider. PREVENTION  The best way to protect yourself from getting a cold is to  practice good hygiene.   Avoid oral or hand contact with people with cold symptoms.   Wash your hands often if contact occurs.  There is no clear evidence that vitamin C, vitamin E, echinacea, or exercise reduces the chance of developing a cold. However, it is always recommended to get plenty of rest, exercise, and practice good nutrition.  SEEK MEDICAL CARE IF:   You are getting worse rather than better.   Your symptoms are not controlled by medicine.   You have chills.  You have worsening shortness of breath.  You have brown or red mucus.  You have yellow or brown nasal discharge.  You have pain in your face, especially when you bend forward.  You have a fever.  You have swollen neck glands.  You have pain while swallowing.  You have white areas in the back of your throat. SEEK IMMEDIATE MEDICAL CARE IF:   You have severe or persistent:  Headache.  Ear pain.  Sinus pain.  Chest pain.  You have chronic lung disease and any of the following:  Wheezing.  Prolonged cough.  Coughing up blood.  A change in your usual mucus.  You have a stiff neck.  You have changes in your:  Vision.  Hearing.  Thinking.  Mood. MAKE SURE YOU:   Understand these instructions.  Will watch your condition.  Will get help right away if you are not doing well or get worse.   This information is not intended to replace advice given to you by your health care provider. Make sure you discuss any questions you have with your health care provider.   Document Released: 01/08/2001 Document Revised: 11/29/2014 Document Reviewed: 10/20/2013 Elsevier Interactive Patient Education [employer].

## 2015-09-27 NOTE — Progress Notes (Signed)
Subjective:    Patient ID: Carl Moore, male    DOB: 1981/01/20, 35 y.o.   MRN: [id number]  HPI  Mr. Khan is a 35 year old male who presents today with a chief complaint of fatigue. He also reports cough, chills, body aches, nasal congestion. His symptoms began yesterday. He was with a co-worker Monday who returned from being out of work for 1 week due to diagnosis of the flu. His co-worker was coughing in his truck as they were riding together.  Denies fevers, but states "i feel like I'm burning up". He's taken tylenol at home for his headache with improvement. His cough is non productive. His most bothersome symptom is fatigue.   Review of Systems  Constitutional: Positive for chills and fatigue. Negative for fever.  HENT: Positive for congestion, sinus pressure and sore throat.   Respiratory: Positive for cough. Negative for shortness of breath.   Cardiovascular: Negative for chest pain.  Musculoskeletal: Positive for myalgias.       Past Medical History  Diagnosis Date  . Chicken pox     Social History   Social History  . Marital Status: Married    Spouse Name: N/A  . Number of Children: N/A  . Years of Education: N/A   Occupational History  . Not on file.   Social History Main Topics  . Smoking status: Never Smoker   . Smokeless tobacco: Not on file  . Alcohol Use: No  . Drug Use: No  . Sexual Activity: Not on file   Other Topics Concern  . 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   No children.   Enjoys playing golf.       Past Surgical History  Procedure Laterality Date  . Knee arthroscopy Left   . Ulnar nerve repair Left     Family History  Problem Relation Age of Onset  . Hypertension Mother   . Stroke Father     Deceased  . Hypertension Father     No Known Allergies  Current Outpatient Prescriptions on File Prior to Visit  Medication Sig Dispense Refill  . cetirizine-pseudoephedrine  (ZYRTEC-D) 5-120 MG tablet Take 1 tablet by mouth 2 (two) times daily as needed for allergies.    Marland Kitchen ibuprofen (ADVIL,MOTRIN) 200 MG tablet Take 400 mg by mouth every 6 (six) hours as needed for moderate pain.     No current facility-administered medications on file prior to visit.    BP 146/80 mmHg  Pulse 98  Temp(Src) 97.9 F (36.6 C) (Oral)  Ht 5\' 8"  (1.727 m)  Wt 170 lb 12.8 oz (77.474 kg)  BMI 25.98 kg/m2  SpO2 99%    Objective:   Physical Exam  Constitutional: He appears well-nourished.  HENT:  Right Ear: Tympanic membrane and ear canal normal.  Left Ear: Tympanic membrane and ear canal normal.  Nose: Right sinus exhibits no maxillary sinus tenderness and no frontal sinus tenderness. Left sinus exhibits no maxillary sinus tenderness and no frontal sinus tenderness.  Mouth/Throat: Oropharynx is clear and moist.  Eyes: Conjunctivae are normal.  Neck: Neck supple.  Cardiovascular: Normal rate and regular rhythm.   Pulmonary/Chest: Effort normal and breath sounds normal. He has no wheezes. He has no rales.  Lymphadenopathy:    He has no cervical adenopathy.  Skin: Skin is warm and dry.          Assessment & Plan:  Fatigue:  Fatigue,  cough, nasal congestion, chills x 24 hours. Exposed to the flu from a co-worker he was in close contact with all day Monday who had symptoms. The co-worker was diagnosed 1 week ago. Exam mostly unremarkable. Lungs clear, HENT exam mostly normal. Rapid Flu: Negative Continue to suspect viral involvement and will treat with supportive measures.  Tylenol, flonase, mucinex, rest. Work note provided. Return precautions provided.

## 2015-09-27 NOTE — Progress Notes (Signed)
Pre visit review using our clinic review tool, if applicable. No additional management support is needed unless otherwise documented below in the visit note. 

## 2015-09-27 NOTE — Addendum Note (Signed)
Addended by: Jacqualin Combes on: 09/27/2015 10:28 AM   Modules accepted: Orders

## 2015-09-28 ENCOUNTER — Telehealth: Payer: Self-pay

## 2015-09-28 DIAGNOSIS — Z20828 Contact with and (suspected) exposure to other viral communicable diseases: Secondary | ICD-10-CM

## 2015-09-28 MED ORDER — OSELTAMIVIR PHOSPHATE 75 MG PO CAPS: 75.0000 mg | ORAL_CAPSULE | Freq: Every day | ORAL | Status: DC

## 2015-09-28 NOTE — Telephone Encounter (Signed)
Okay to send Tamiflu since he's in the 48 hour window. RX sent to [facility] as requested. Vallarie Mare, will you notify patient RX sent and update his work note? Thanks.

## 2015-09-28 NOTE — Telephone Encounter (Signed)
Called and notified patient of Kate's comments. Patient verbalized understanding. Left letter in front office for patient to pick up.

## 2015-09-28 NOTE — Telephone Encounter (Signed)
Pt left v/m; pt was seen 09/27/15; pt does not feel any better than he did on 09/27/15 and pt request another work note to be out of work 09/28/15. Pt also wants to know if [name] NP would prescribe tamiflu to [facility] [address]. Pt request cb.

## 2015-10-23 ENCOUNTER — Encounter: Payer: Self-pay | Admitting: Internal Medicine

## 2015-10-23 ENCOUNTER — Ambulatory Visit (INDEPENDENT_AMBULATORY_CARE_PROVIDER_SITE_OTHER): Payer: 59 | Admitting: Internal Medicine

## 2015-10-23 VITALS — BP 140/90 | HR 93 | Temp 98.6°F | Resp 12 | Wt 165.0 lb

## 2015-10-23 DIAGNOSIS — J069 Acute upper respiratory infection, unspecified: Secondary | ICD-10-CM | POA: Insufficient documentation

## 2015-10-23 MED ORDER — HYDROCODONE-HOMATROPINE 5-1.5 MG/5ML PO SYRP: 5.0000 mL | ORAL_SOLUTION | Freq: Every evening | ORAL | Status: DC | PRN

## 2015-10-23 NOTE — Progress Notes (Signed)
   Subjective:    Patient ID: Carl Moore, male    DOB: 1980-10-24, 35 y.o.   MRN: [id number]  HPI Here due to respiratory illness  Coughing for past week--but worse in past 2-3 days Fever to 100.7 two days ago. Better by last night Cough is in chest --- "unbearable". Persistent coughing at night---keeping him from sleeping Cough in day--but not as bad in day  Clear or light green mucus Chills and sweats 2 days ago No SOB No headache No body aches Some sore throat No ear pain  Tried robitussin cold and congestion-- no help Tried left over amoxil from wife yesterday  Current Outpatient Prescriptions on File Prior to Visit  Medication Sig Dispense Refill  . cetirizine-pseudoephedrine (ZYRTEC-D) 5-120 MG tablet Take 1 tablet by mouth 2 (two) times daily as needed for allergies.    Marland Kitchen ibuprofen (ADVIL,MOTRIN) 200 MG tablet Take 400 mg by mouth every 6 (six) hours as needed for moderate pain.     No current facility-administered medications on file prior to visit.    No Known Allergies  Past Medical History  Diagnosis Date  . Chicken pox     Past Surgical History  Procedure Laterality Date  . Knee arthroscopy Left   . Ulnar nerve repair Left     Family History  Problem Relation Age of Onset  . Hypertension Mother   . Stroke Father     Deceased  . Hypertension Father     Social History   Social History  . Marital Status: Married    Spouse Name: N/A  . Number of Children: N/A  . Years of Education: N/A   Occupational History  . Not on file.   Social History Main Topics  . Smoking status: Never Smoker   . Smokeless tobacco: Not on file  . Alcohol Use: No  . Drug Use: No  . Sexual Activity: Not on file   Other Topics Concern  . 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   No children.   Enjoys playing golf.      Review of Systems Did get over illness from 3 weeks ago--but never got over the cough No  rash No vomiting or diarrhea Appetite is off in past 2 days    Objective:   Physical Exam  Constitutional: He appears well-developed and well-nourished. No distress.  HENT:  Mouth/Throat: Oropharynx is clear and moist. No oropharyngeal exudate.  No sinus tenderness TMs okay--lots of cerumen Mild nasal inflammation   Neck: Normal range of motion. Neck supple.  Non tender bilateral anterior cervical nodes  Pulmonary/Chest: Effort normal and breath sounds normal. No respiratory distress. He has no wheezes. He has no rales.          Assessment & Plan:

## 2015-10-23 NOTE — Progress Notes (Signed)
Pre visit review using our clinic review tool, if applicable. No additional management support is needed unless otherwise documented below in the visit note. 

## 2015-10-23 NOTE — Assessment & Plan Note (Signed)
Still seems viral Had residual cough from earlier infection--now sick for a week No signs of pneumonia ?sinusitis  If not improving with supportive Rx--would send Rx for antibiotic later this week

## 2015-10-23 NOTE — Patient Instructions (Signed)
Send me a message if you aren't improving later in the week, I will send an antibiotic prescription for you.

## 2015-10-26 ENCOUNTER — Telehealth: Payer: Self-pay | Admitting: *Deleted

## 2015-10-26 MED ORDER — AMOXICILLIN 500 MG PO TABS: 1000.0000 mg | ORAL_TABLET | Freq: Two times a day (BID) | ORAL | Status: DC

## 2015-10-26 NOTE — Telephone Encounter (Signed)
Spoke to patient

## 2015-10-26 NOTE — Telephone Encounter (Signed)
Pt left vm and states that he recently saw Dr Silvio Pate. He was advised to contact office back should his cough not improve, and an abx would be sent. Pt states that he has had no improvement and is wanting abx sent to [facility]

## 2015-10-26 NOTE — Telephone Encounter (Signed)
Let him know I sent the prescription

## 2015-10-30 NOTE — Telephone Encounter (Signed)
It sounds like he should be seen again to reevaluate

## 2015-10-30 NOTE — Telephone Encounter (Signed)
Pt called back and pt is taking abx but cannot see any improvement with cough; prod cough with green phlegm; cough med at night is helping but cough during the day is rough;pt cannot take nighttime cough med during the day because makes pt sleepy. No fever, wheezing or SOB. pt is taking zyrtec during the day; no OTC med for cough being taken. [facility] [address] Please advise.

## 2015-10-30 NOTE — Telephone Encounter (Addendum)
Pt left v/m; pt is no better than when seen on 10/23/15 and pt wants to know what else can be done. Unable to reach pt or his wife for more info. [facility] [address].

## 2015-10-30 NOTE — Telephone Encounter (Signed)
Spoke to patient. Made appt for tomorrow at 1215.

## 2015-10-31 ENCOUNTER — Encounter: Payer: Self-pay | Admitting: Internal Medicine

## 2015-10-31 ENCOUNTER — Ambulatory Visit (INDEPENDENT_AMBULATORY_CARE_PROVIDER_SITE_OTHER)
Admission: RE | Admit: 2015-10-31 | Discharge: 2015-10-31 | Disposition: A | Discharge status: Home / Self Care | Payer: 59 | Source: Ambulatory Visit | Attending: Internal Medicine | Admitting: Internal Medicine

## 2015-10-31 ENCOUNTER — Ambulatory Visit (INDEPENDENT_AMBULATORY_CARE_PROVIDER_SITE_OTHER): Payer: 59 | Admitting: Internal Medicine

## 2015-10-31 VITALS — BP 136/80 | HR 90 | Temp 98.6°F | Resp 18 | Wt 166.0 lb

## 2015-10-31 DIAGNOSIS — R059 Cough, unspecified: Secondary | ICD-10-CM | POA: Insufficient documentation

## 2015-10-31 DIAGNOSIS — R05 Cough: Secondary | ICD-10-CM | POA: Diagnosis not present

## 2015-10-31 DIAGNOSIS — R051 Acute cough: Secondary | ICD-10-CM | POA: Insufficient documentation

## 2015-10-31 MED ORDER — LEVOFLOXACIN 500 MG PO TABS: 500.0000 mg | ORAL_TABLET | Freq: Every day | ORAL | Status: DC

## 2015-10-31 NOTE — Progress Notes (Signed)
Pre visit review using our clinic review tool, if applicable. No additional management support is needed unless otherwise documented below in the visit note. 

## 2015-10-31 NOTE — Assessment & Plan Note (Addendum)
Goes back a month No response with amoxil ?atypical infection CXR done---looks okay (will await the overread) Due to chronicity, will try levaquin

## 2015-10-31 NOTE — Progress Notes (Signed)
   Subjective:    Patient ID: Carl Moore, male    DOB: Nov 28, 1980, 35 y.o.   MRN: [id number]  HPI Here due to ongoing respiratory symptoms  Ongoing cough Lots in day but not as bad at night with cough syrup Started the antibiotic last week--no help No fever but still doesn't feel right Occasionally DOE--moving around No sweats or chills  Cough occasionally productive of green sputum. Clear at times No ear pain or sore throat No headache or bad head congestion Cough feels like in the chest  Tylenol also  Current Outpatient Prescriptions on File Prior to Visit  Medication Sig Dispense Refill  . amoxicillin (AMOXIL) 500 MG tablet Take 2 tablets (1,000 mg total) by mouth 2 (two) times daily. 40 tablet 0  . cetirizine-pseudoephedrine (ZYRTEC-D) 5-120 MG tablet Take 1 tablet by mouth 2 (two) times daily as needed for allergies.    Marland Kitchen HYDROcodone-homatropine (HYCODAN) 5-1.5 MG/5ML syrup Take 5 mLs by mouth at bedtime as needed for cough. 120 mL 0  . ibuprofen (ADVIL,MOTRIN) 200 MG tablet Take 400 mg by mouth every 6 (six) hours as needed for moderate pain.     No current facility-administered medications on file prior to visit.    No Known Allergies  Past Medical History  Diagnosis Date  . Chicken pox     Past Surgical History  Procedure Laterality Date  . Knee arthroscopy Left   . Ulnar nerve repair Left     Family History  Problem Relation Age of Onset  . Hypertension Mother   . Stroke Father     Deceased  . Hypertension Father     Social History   Social History  . Marital Status: Married    Spouse Name: N/A  . Number of Children: N/A  . Years of Education: N/A   Occupational History  . Not on file.   Social History Main Topics  . Smoking status: Never Smoker   . Smokeless tobacco: Not on file  . Alcohol Use: No  . Drug Use: No  . Sexual Activity: Not on file   Other Topics Concern  . Not on file   Social History Narrative   Works as  Development worker, international aid.   Married. Newly wed.   No children.   Enjoys playing golf.      Review of Systems  No rash No recent travel No vomiting or diarrhea Eating okay     Objective:   Physical Exam  Constitutional: He appears well-developed and well-nourished. No distress.  HENT:  No sinus tenderness TMs normal Slight pharyngeal injection Moderate nasal inflammation  Neck: Normal range of motion. Neck supple. No thyromegaly present.  Pulmonary/Chest: Effort normal. No respiratory distress. He has no wheezes. He has no rales.  ?slight decreased breath sounds at left base Otherwise normal No dullness          Assessment & Plan:

## 2016-04-26 ENCOUNTER — Ambulatory Visit (INDEPENDENT_AMBULATORY_CARE_PROVIDER_SITE_OTHER): Payer: 59 | Admitting: Primary Care

## 2016-04-26 VITALS — BP 136/84 | HR 84 | Temp 98.0°F | Ht 68.0 in | Wt 164.1 lb

## 2016-04-26 DIAGNOSIS — H9203 Otalgia, bilateral: Secondary | ICD-10-CM

## 2016-04-26 DIAGNOSIS — H6123 Impacted cerumen, bilateral: Secondary | ICD-10-CM | POA: Diagnosis not present

## 2016-04-26 NOTE — Progress Notes (Signed)
Pre visit review using our clinic review tool, if applicable. No additional management support is needed unless otherwise documented below in the visit note. 

## 2016-04-26 NOTE — Progress Notes (Signed)
   Subjective:    Patient ID: Carl Moore, male    DOB: [birth year], 35 y.o.   MRN: [id number]  HPI  Mr. Hertenstein is a 35 year old male who presents today with a chief complaint of ear pain. He also reports ear fullness. His pain is located to the right ear and has been present for the past several days. He's taken Zyrtec with some improvement. He does have a history of cerumen impaction. Denies fevers, cough, sore throat, sinus pressure.   Review of Systems  Constitutional: Negative for chills, fatigue and fever.  HENT: Positive for ear pain. Negative for congestion, sinus pressure and sore throat.   Respiratory: Negative for shortness of breath.   Cardiovascular: Negative for chest pain.  Neurological: Negative for headaches.       Objective:   Physical Exam  Constitutional: He appears well-nourished.  HENT:  Right Ear: Tympanic membrane and ear canal normal.  Left Ear: Tympanic membrane and ear canal normal.  Nose: No mucosal edema. Right sinus exhibits no maxillary sinus tenderness and no frontal sinus tenderness. Left sinus exhibits no maxillary sinus tenderness and no frontal sinus tenderness.  Mouth/Throat: Oropharynx is clear and moist.  Cerumen impaction to bilateral canals. Post irrigation TM's stable. Canals with mild irritation given adhesion of cerumen to skin. No acute infection.  Eyes: Conjunctivae are normal.  Neck: Neck supple.  Cardiovascular: Normal rate and regular rhythm.   Pulmonary/Chest: Effort normal and breath sounds normal. He has no wheezes. He has no rales.  Skin: Skin is warm and dry.          Assessment & Plan:  Otalgia:  Located to right ear x 3 days. Absence of other respiratory symptoms. Exam with bilateral cerumen impaction to canals. Post irrigation TM's stable. Canals with mild irritation given adhesion of cerumen to skin. No acute infection. Discussed use of Debrox drops in the future. Follow up as needed.  Sheral Flow, NP

## 2016-04-26 NOTE — Patient Instructions (Signed)
Your ears were impacted with ear wax.   Try using Debrox drops to soften any future wax.  It was a pleasure to see you today!  Cerumen Impaction The structures of the external ear canal secrete a waxy substance known as cerumen. Excess cerumen can build up in the ear canal, causing a condition known as cerumen impaction. Cerumen impaction can cause ear pain and disrupt the function of the ear. The rate of cerumen production differs for each individual. In certain individuals, the configuration of the ear canal may decrease his or her ability to naturally remove cerumen. CAUSES Cerumen impaction is caused by excessive cerumen production or buildup. RISK FACTORS  Frequent use of swabs to clean ears.  Having narrow ear canals.  Having eczema.  Being dehydrated. SIGNS AND SYMPTOMS  Diminished hearing.  Ear drainage.  Ear pain.  Ear itch. TREATMENT Treatment may involve:  Over-the-counter or prescription ear drops to soften the cerumen.  Removal of cerumen by a health care provider. This may be done with:  Irrigation with warm water. This is the most common method of removal.  Ear curettes and other instruments.  Surgery. This may be done in severe cases. HOME CARE INSTRUCTIONS  Take medicines only as directed by your health care provider.  Do not insert objects into the ear with the intent of cleaning the ear. PREVENTION  Do not insert objects into the ear, even with the intent of cleaning the ear. Removing cerumen as a part of normal hygiene is not necessary, and the use of swabs in the ear canal is not recommended.  Drink enough water to keep your urine clear or pale yellow.  Control your eczema if you have it. SEEK MEDICAL CARE IF:  You develop ear pain.  You develop bleeding from the ear.  The cerumen does not clear after you use ear drops as directed.   This information is not intended to replace advice given to you by your health care provider. Make sure  you discuss any questions you have with your health care provider.   Document Released: 08/22/2004 Document Revised: 08/05/2014 Document Reviewed: 03/01/2015 Elsevier Interactive Patient Education [employer].

## 2016-04-28 ENCOUNTER — Other Ambulatory Visit: Payer: Self-pay | Admitting: Primary Care

## 2016-04-28 DIAGNOSIS — Z0000 Encounter for general adult medical examination without abnormal findings: Secondary | ICD-10-CM

## 2016-05-03 ENCOUNTER — Other Ambulatory Visit: Payer: Self-pay

## 2016-05-03 ENCOUNTER — Other Ambulatory Visit (INDEPENDENT_AMBULATORY_CARE_PROVIDER_SITE_OTHER): Payer: 59

## 2016-05-03 DIAGNOSIS — Z0000 Encounter for general adult medical examination without abnormal findings: Secondary | ICD-10-CM | POA: Diagnosis not present

## 2016-05-03 LAB — LIPID PANEL
CHOL/HDL RATIO: 4
Cholesterol: 192 mg/dL (ref 0–200)
HDL: 43.6 mg/dL (ref >39.00)
NONHDL: 148.45
Triglycerides: 226 mg/dL — ABNORMAL HIGH (ref 0.0–149.0)
VLDL: 45.2 mg/dL — AB (ref 0.0–40.0)

## 2016-05-03 LAB — COMPREHENSIVE METABOLIC PANEL
ALT: 40 U/L (ref 0–53)
AST: 27 U/L (ref 0–37)
Albumin: 4.3 g/dL (ref 3.5–5.2)
Alkaline Phosphatase: 81 U/L (ref 39–117)
BILIRUBIN TOTAL: 0.6 mg/dL (ref 0.2–1.2)
BUN: 18 mg/dL (ref 6–23)
CHLORIDE: 103 meq/L (ref 96–112)
CO2: 30 meq/L (ref 19–32)
Calcium: 9.6 mg/dL (ref 8.4–10.5)
Creatinine, Ser: 1.09 mg/dL (ref 0.40–1.50)
GFR: 80.94 mL/min (ref >60.00)
GLUCOSE: 89 mg/dL (ref 70–99)
Potassium: 4.4 mEq/L (ref 3.5–5.1)
Sodium: 140 mEq/L (ref 135–145)
Total Protein: 7.3 g/dL (ref 6.0–8.3)

## 2016-05-03 LAB — LDL CHOLESTEROL, DIRECT: LDL DIRECT: 109 mg/dL

## 2016-05-10 ENCOUNTER — Ambulatory Visit (INDEPENDENT_AMBULATORY_CARE_PROVIDER_SITE_OTHER): Payer: 59 | Admitting: Primary Care

## 2016-05-10 ENCOUNTER — Encounter: Payer: Self-pay | Admitting: Primary Care

## 2016-05-10 VITALS — BP 132/80 | HR 66 | Temp 98.0°F | Ht 68.0 in | Wt 162.1 lb

## 2016-05-10 DIAGNOSIS — E781 Pure hyperglyceridemia: Secondary | ICD-10-CM | POA: Diagnosis not present

## 2016-05-10 DIAGNOSIS — Z0000 Encounter for general adult medical examination without abnormal findings: Secondary | ICD-10-CM

## 2016-05-10 NOTE — Assessment & Plan Note (Signed)
Immunizations UTD. Poor diet, discussed to reduce fast food and processed carbohydrates. Continue exercise. Exam unremarkable. Labs with hypertriglyceridemia.  Follow up in 1 year for repeat physical.

## 2016-05-10 NOTE — Progress Notes (Signed)
Pre visit review using our clinic review tool, if applicable. No additional management support is needed unless otherwise documented below in the visit note. 

## 2016-05-10 NOTE — Patient Instructions (Signed)
Your triglycerides are too high and your cholesterol is borderline hight.  It's importance to improve your diet by reducing consumption of fast food, fried food, processed snack foods, sugary drinks. Increase consumption of fresh vegetables and fruits, whole grains, water.  Ensure you are drinking 64 ounces of water daily.  Continue exercising. You should be getting 150 minutes of moderate intensity exercise weekly.  I recommend you start taking Fish Oil 1000 mg once daily with meals.  Schedule a lab only appointment in 6 months to recheck your cholesterol.  Follow up in 1 year for repeat physical or sooner if needed.  It was a pleasure to see you today!  Food Choices to Lower Your Triglycerides Triglycerides are a type of fat in your blood. High levels of triglycerides can increase the risk of heart disease and stroke. If your triglyceride levels are high, the foods you eat and your eating habits are very important. Choosing the right foods can help lower your triglycerides.  WHAT GENERAL GUIDELINES DO I NEED TO FOLLOW?  Lose weight if you are overweight.   Limit or avoid alcohol.   Fill one half of your plate with vegetables and green salads.   Limit fruit to two servings a day. Choose fruit instead of juice.   Make one fourth of your plate whole grains. Look for the word "whole" as the first word in the ingredient list.  Fill one fourth of your plate with lean protein foods.  Enjoy fatty fish (such as salmon, mackerel, sardines, and tuna) three times a week.   Choose healthy fats.   Limit foods high in starch and sugar.  Eat more home-cooked food and less restaurant, buffet, and fast food.  Limit fried foods.  Cook foods using methods other than frying.  Limit saturated fats.  Check ingredient lists to avoid foods with partially hydrogenated oils (trans fats) in them. WHAT FOODS CAN I EAT?  Grains Whole grains, such as whole wheat or whole grain breads,  crackers, cereals, and pasta. Unsweetened oatmeal, bulgur, barley, quinoa, or brown rice. Corn or whole wheat flour tortillas.  Vegetables Fresh or frozen vegetables (raw, steamed, roasted, or grilled). Green salads. Fruits All fresh, canned (in natural juice), or frozen fruits. Meat and Other Protein Products Ground beef (85% or leaner), grass-fed beef, or beef trimmed of fat. Skinless chicken or [place]. Ground chicken or [place]. Pork trimmed of fat. All fish and seafood. Eggs. Dried beans, peas, or lentils. Unsalted nuts or seeds. Unsalted canned or dry beans. Dairy Low-fat dairy products, such as skim or 1% milk, 2% or reduced-fat cheeses, low-fat ricotta or cottage cheese, or plain low-fat yogurt. Fats and Oils Tub margarines without trans fats. Light or reduced-fat mayonnaise and salad dressings. Avocado. Safflower, olive, or canola oils. Natural peanut or almond butter. The items listed above may not be a complete list of recommended foods or beverages. Contact your dietitian for more options. WHAT FOODS ARE NOT RECOMMENDED?  Grains White bread. White pasta. White rice. Cornbread. Bagels, pastries, and croissants. Crackers that contain trans fat. Vegetables White potatoes. Corn. Creamed or fried vegetables. Vegetables in a cheese sauce. Fruits Dried fruits. Canned fruit in light or heavy syrup. Fruit juice. Meat and Other Protein Products Fatty cuts of meat. Ribs, chicken wings, bacon, sausage, bologna, salami, chitterlings, fatback, hot dogs, bratwurst, and packaged luncheon meats. Dairy Whole or 2% milk, cream, half-and-half, and cream cheese. Whole-fat or sweetened yogurt. Full-fat cheeses. Nondairy creamers and whipped toppings. Processed cheese, cheese spreads, or  cheese curds. Sweets and Desserts Corn syrup, sugars, honey, and molasses. Candy. Jam and jelly. Syrup. Sweetened cereals. Cookies, pies, cakes, donuts, muffins, and ice cream. Fats and Oils Butter, stick margarine,  lard, shortening, ghee, or bacon fat. Coconut, palm kernel, or palm oils. Beverages Alcohol. Sweetened drinks (such as sodas, lemonade, and fruit drinks or punches). The items listed above may not be a complete list of foods and beverages to avoid. Contact your dietitian for more information.   This information is not intended to replace advice given to you by your health care provider. Make sure you discuss any questions you have with your health care provider.   Document Released: 05/02/2004 Document Revised: 08/05/2014 Document Reviewed: 05/19/2013 Elsevier Interactive Patient Education [employer].

## 2016-05-10 NOTE — Progress Notes (Signed)
Subjective:    Patient ID: Carl Moore, male    DOB: [birth year], 35 y.o.   MRN: [id number]  HPI  Carl Moore is a 35 year old male who presents today for complete physical.  Immunizations: -Tetanus: Completed in 2015 -Influenza: Completed in 2017  Diet: He endorses a poor diet. Breakfast: Fast food, fruit Lunch: Restaurants, fast food Dinner: Grilled meat, potatoes, no vegetables.  Snacks: Occasionally, fruit snacks Desserts: 1-2 times weekly Beverages: Soda, sweet tea, water  Exercise: He works out at the [employer] 3 days weekly. Eye exam: Completed several years ago. Dental exam: Completes semi-annually   Review of Systems  Constitutional: Negative for unexpected weight change.  HENT: Negative for rhinorrhea.   Respiratory: Negative for cough and shortness of breath.   Cardiovascular: Negative for chest pain.  Gastrointestinal: Negative for constipation and diarrhea.  Genitourinary: Negative for difficulty urinating.  Musculoskeletal: Negative for arthralgias and myalgias.  Skin: Negative for rash.  Allergic/Immunologic: Negative for environmental allergies.  Neurological: Negative for dizziness, numbness and headaches.  Psychiatric/Behavioral:       Denies concerns for anxiety or depression       Past Medical History:  Diagnosis Date  . Chicken pox      Social History   Social History  . Marital status: Married    Spouse name: N/A  . Number of children: N/A  . Years of education: N/A   Occupational History  . Not on file.   Social History Main Topics  . Smoking status: Never Smoker  . Smokeless tobacco: Not on file  . Alcohol use No  . Drug use: No  . Sexual activity: Not on file   Other Topics Concern  . 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   No children.   Enjoys playing golf.       Past Surgical History:  Procedure Laterality Date  . KNEE ARTHROSCOPY Left   . ULNAR NERVE REPAIR Left      Family History  Problem Relation Age of Onset  . Hypertension Mother   . Stroke Father     Deceased  . Hypertension Father     No Known Allergies  No current outpatient prescriptions on file prior to visit.   No current facility-administered medications on file prior to visit.     BP 132/80   Pulse 66   Temp 98 F (36.7 C) (Oral)   Ht 5\' 8"  (1.727 m)   Wt 162 lb 1.9 oz (73.5 kg)   SpO2 98%   BMI 24.65 kg/m    Objective:   Physical Exam  Constitutional: He is oriented to person, place, and time. He appears well-nourished.  HENT:  Right Ear: Tympanic membrane and ear canal normal.  Left Ear: Tympanic membrane and ear canal normal.  Nose: Nose normal. Right sinus exhibits no maxillary sinus tenderness and no frontal sinus tenderness. Left sinus exhibits no maxillary sinus tenderness and no frontal sinus tenderness.  Mouth/Throat: Oropharynx is clear and moist.  Eyes: Conjunctivae and EOM are normal. Pupils are equal, round, and reactive to light.  Neck: Neck supple. Carotid bruit is not present. No thyromegaly present.  Cardiovascular: Normal rate, regular rhythm and normal heart sounds.   Pulmonary/Chest: Effort normal and breath sounds normal. He has no wheezes. He has no rales.  Abdominal: Soft. Bowel sounds are normal. There is no tenderness.  Musculoskeletal: Normal range of motion.  Neurological: He is alert  and oriented to person, place, and time. He has normal reflexes. No cranial nerve deficit.  Skin: Skin is warm and dry.  Psychiatric: He has a normal mood and affect.          Assessment & Plan:

## 2016-05-10 NOTE — Assessment & Plan Note (Signed)
Level of 226 on recent labs. Poor diet with frequent fast food consumption. Handout provided regarding healthy diet to lower trigs. Recheck cholesterol in 6 months.

## 2016-07-17 ENCOUNTER — Telehealth: Payer: Self-pay | Admitting: Primary Care

## 2016-07-17 NOTE — Telephone Encounter (Signed)
[facility] Patient Name: Carl Moore DOB: January 07, 1981 Initial Comment Caller states last night had fever of 101 and chills, achy all over body. Currently still having chills, no energy, and fever of 98.9 Nurse Assessment Nurse: Angeline Slim, RN, [place] Date/Time (Eastern Time): 07/17/2016 12:41:59 PM Confirm and document reason for call. If symptomatic, describe symptoms. ---Caller states he had fever last night adn chills but no fever now it is 98.9 and no energy. No coughing or congestion just chills and weak Does the patient have any new or worsening symptoms? ---Yes Will a triage be completed? ---Yes Related visit to physician within the last 2 weeks? ---No Does the PT have any chronic conditions? (i.e. diabetes, asthma, etc.) ---No Is this a behavioral health or substance abuse call? ---No Guidelines Guideline Title Affirmed Question Affirmed Notes Weakness (Generalized) and Fatigue Mild weakness or fatigue with acute minor illness (e.g., colds) (all triage questions negative) Final Disposition User Home Care Zellers, RN, A[place]on Disagree/Comply: Comply

## 2016-07-17 NOTE — Telephone Encounter (Signed)
PLEASE NOTE: All timestamps contained within this report are represented as [place] Standard Time. CONFIDENTIALTY NOTICE: This fax transmission is intended only for the addressee. It contains information that is legally privileged, confidential or otherwise protected from use or disclosure. If you are not the intended recipient, you are strictly prohibited from reviewing, disclosing, copying using or disseminating any of this information or taking any action in reliance on or regarding this information. If you have received this fax in error, please notify us immediately by telephone so that we can arrange for its return to [place]. Phone: [phone], Toll-Free: [phone], Fax: [phone] Page: 1 of 2 Call Id: [id number] [place] Patient Name: Carl Moore Gender: Male DOB: [birth year] Age: 35 Y 1 M 17 D Return Phone Number: [phone] (Primary) [place] [address] Client [facility] - Client Client Site [place] - Day Physician Alma Friendly - NP Contact Type Call Who Is Calling Patient / Member / Family / Caregiver Call Type Triage / Clinical Relationship To Patient Self Return Phone Number [phone] (Primary) Chief Complaint Fatigue (>THREE MONTHS) Reason for Call Symptomatic / Request for [employer] states last night had fever of 101 and chills, achy all over body. Currently still having chills, no energy, and fever of 98.9 Appointment Disposition EMR Appointment Not Necessary Info pasted into Epic No PreDisposition Did not know what to do Translation No Nurse Assessment Nurse: Angeline Slim, RN, [place] Date/Time (Eastern Time): 07/17/2016 12:41:59 PM Confirm and document reason for call. If symptomatic, describe symptoms. ---Caller states he had fever last night adn chills  but no fever now it is 98.9 and no energy. No coughing or congestion just chills and weak Does the patient have any new or worsening symptoms? ---Yes Will a triage be completed? ---Yes Related visit to physician within the last 2 weeks? ---No Does the PT have any chronic conditions? (i.e. diabetes, asthma, etc.) ---No Is this a behavioral health or substance abuse call? ---No Guidelines Guideline Title Affirmed Question Affirmed Notes Nurse Date/Time Eilene Ghazi Time) Weakness (Generalized) and Fatigue Mild weakness or fatigue with acute minor illness (e.g., colds) (all triage questions negative) Zellers, RN, A[place] 07/17/2016 12:44:01 PM Disp. Time Eilene Ghazi Time) Disposition Final User 07/17/2016 12:46:15 PM Home Care Yes Zellers, RN, [place]on PLEASE NOTE: All timestamps contained within this report are represented as [place] Standard Time. CONFIDENTIALTY NOTICE: This fax transmission is intended only for the addressee. It contains information that is legally privileged, confidential or otherwise protected from use or disclosure. If you are not the intended recipient, you are strictly prohibited from reviewing, disclosing, copying using or disseminating any of this information or taking any action in reliance on or regarding this information. If you have received this fax in error, please notify us immediately by telephone so that we can arrange for its return to [place]. Phone: [phone], Toll-Free: [phone], Fax: [phone] Page: 2 of 2 Call Id: [id number] Caller Understands: Yes Disagree/Comply: Comply Care Advice Given Per Guideline HOME CARE: You should be able to treat this at home. * Weakness often accompanies viral illnesses (e.g., colds and flu) * The weakness is usually worse the first three days of the illness, then gets better. * A fever can make you feel weak. FLUIDS: Drink several glasses of fruit juice, other clear fluids or water. This will improve hydration and blood  glucose. * Drink cold fluids to  prevent dehydration. FOR ALL FEVERS: * For fever relief, take acetaminophen or ibuprofen. * Breathing difficulty occurs * Unable to stand or walk CALL BACK IF: * You become worse.

## 2016-07-17 NOTE — Telephone Encounter (Signed)
Noted and agree with triage plan.

## 2016-07-18 ENCOUNTER — Ambulatory Visit: Payer: 59 | Admitting: Internal Medicine

## 2016-08-30 ENCOUNTER — Encounter: Payer: Self-pay | Admitting: Family Medicine

## 2016-08-30 ENCOUNTER — Ambulatory Visit (INDEPENDENT_AMBULATORY_CARE_PROVIDER_SITE_OTHER): Payer: 59 | Admitting: Family Medicine

## 2016-08-30 VITALS — BP 134/78 | HR 78 | Temp 98.8°F | Resp 18 | Wt 168.7 lb

## 2016-08-30 DIAGNOSIS — J111 Influenza due to unidentified influenza virus with other respiratory manifestations: Secondary | ICD-10-CM

## 2016-08-30 MED ORDER — OSELTAMIVIR PHOSPHATE 75 MG PO CAPS: 75.0000 mg | ORAL_CAPSULE | Freq: Two times a day (BID) | ORAL | 0 refills | Status: DC

## 2016-08-30 NOTE — Progress Notes (Addendum)
   Subjective:    Patient ID: Carl Moore, male    DOB: [birth year], 36 y.o.   MRN: [id number]  HPI This is a 36 yo male who presents today with one day of chills, feeling bad- lost energy. Stomach upset yesterday afternoon, had about 5 episodes of diarrhea, used some essential oils. No further episodes of diarrhea today. No vomiting. Aches and chills started last night. Several coworkers sick. Some sore throat, chest is sore and tight. Small amount light green mucus this morning. Headache yesterday evening, no ear aches. No SOB or wheeze. Took ibuprofen 600 mg last night and this morning.  He has an 48 month old daughter at home and is concerned about her getting sick.    Past Medical History:  Diagnosis Date  . Chicken pox    Past Surgical History:  Procedure Laterality Date  . KNEE ARTHROSCOPY Left   . ULNAR NERVE REPAIR Left    Family History  Problem Relation Age of Onset  . Hypertension Mother   . Stroke Father     Deceased  . Hypertension Father    Social History  Substance Use Topics  . Smoking status: Never Smoker  . Smokeless tobacco: Never Used  . Alcohol use No      Review of Systems Per HPI    Objective:   Physical Exam  Constitutional: He is oriented to person, place, and time. He appears well-developed and well-nourished. He appears ill. No distress.  HENT:  Head: Normocephalic and atraumatic.  Right Ear: External ear normal.  Left Ear: External ear normal.  Nose: Nose normal.  Mouth/Throat: Oropharynx is clear and moist. No oropharyngeal exudate.  Eyes: Conjunctivae are normal.  Neck: Normal range of motion. Neck supple.  Cardiovascular: Normal rate, regular rhythm and normal heart sounds.   Pulmonary/Chest: Effort normal and breath sounds normal.  Lymphadenopathy:    He has no cervical adenopathy.  Neurological: He is alert and oriented to person, place, and time.  Skin: Skin is warm and dry. He is not diaphoretic.  Psychiatric: He has a  normal mood and affect. His behavior is normal. Judgment and thought content normal.  Vitals reviewed.     BP 134/78 (BP Location: Left Arm, Patient Position: Sitting, Cuff Size: Normal)   Pulse 78   Temp 98.8 F (37.1 C) (Oral)   Resp 18   Wt 168 lb 11.2 oz (76.5 kg)   SpO2 98%   BMI 25.65 kg/m  Wt Readings from Last 3 Encounters:  08/30/16 168 lb 11.2 oz (76.5 kg)  05/10/16 162 lb 1.9 oz (73.5 kg)  04/26/16 164 lb 1.9 oz (74.4 kg)   BP Readings from Last 3 Encounters:  08/30/16 134/78  05/10/16 132/80  04/26/16 136/84       Assessment & Plan:  1. Influenza - Provided written and verbal information regarding diagnosis and treatment. - discussed risks and benefits of oseltamivir and patient would like to have treatment, discussed symptomatic treatment as well as RTC/ER precautions - oseltamivir (TAMIFLU) 75 MG capsule; Take 1 capsule (75 mg total) by mouth 2 (two) times daily.  Dispense: 10 capsule; Refill: 0   Clarene Reamer, FNP-BC  [facility]  08/30/2016 11:29 AM

## 2016-08-30 NOTE — Progress Notes (Signed)
Pre visit review using our clinic review tool, if applicable. No additional management support is needed unless otherwise documented below in the visit note. 

## 2016-08-30 NOTE — Patient Instructions (Signed)
For nasal congestion you can use Afrin nasal spray for 3 days max, Sudafed, saline nasal spray (generic is fine for all). For cough you can try Delsym. Drink enough fluids to make your urine light yellow. For fever/chill/muscle aches you can take over the counter acetaminophen or ibuprofen.  Please come back in if you are not better in 5-7 days or if you develop wheezing, shortness of breath or persistent vomiting.   Influenza, Adult Influenza, more commonly known as "the flu," is a viral infection that primarily affects the respiratory tract. The respiratory tract includes organs that help you breathe, such as the lungs, nose, and throat. The flu causes many common cold symptoms, as well as a high fever and body aches. The flu spreads easily from person to person (is contagious). Getting a flu shot (influenza vaccination) every year is the best way to prevent influenza. What are the causes? Influenza is caused by a virus. You can catch the virus by:  Breathing in droplets from an infected person's cough or sneeze.  Touching something that was recently contaminated with the virus and then touching your mouth, nose, or eyes. What increases the risk? The following factors may make you more likely to get the flu:  Not cleaning your hands frequently with soap and water or alcohol-based hand sanitizer.  Having close contact with many people during cold and flu season.  Touching your mouth, eyes, or nose without washing or sanitizing your hands first.  Not drinking enough fluids or not eating a healthy diet.  Not getting enough sleep or exercise.  Being under a high amount of stress.  Not getting a yearly (annual) flu shot. You may be at a higher risk of complications from the flu, such as a severe lung infection (pneumonia), if you:  Are over the age of 10.  Are pregnant.  Have a weakened disease-fighting system (immune system). You may have a weakened immune system if you:  Have  HIV or AIDS.  Are undergoing chemotherapy.  Aretaking medicines that reduce the activity of (suppress) the immune system.  Have a long-term (chronic) illness, such as heart disease, kidney disease, diabetes, or lung disease.  Have a liver disorder.  Are obese.  Have anemia. What are the signs or symptoms? Symptoms of this condition typically last 4-10 days and may include:  Fever.  Chills.  Headache, body aches, or muscle aches.  Sore throat.  Cough.  Runny or congested nose.  Chest discomfort and cough.  Poor appetite.  Weakness or tiredness (fatigue).  Dizziness.  Nausea or vomiting. How is this diagnosed? This condition may be diagnosed based on your medical history and a physical exam. Your health care provider may do a nose or throat swab test to confirm the diagnosis. How is this treated? If influenza is detected early, you can be treated with antiviral medicine that can reduce the length of your illness and the severity of your symptoms. This medicine may be given by mouth (orally) or through an IV tube that is inserted in one of your veins. The goal of treatment is to relieve symptoms by taking care of yourself at home. This may include taking over-the-counter medicines, drinking plenty of fluids, and adding humidity to the air in your home. In some cases, influenza goes away on its own. Severe influenza or complications from influenza may be treated in a hospital. Follow these instructions at home:  Take over-the-counter and prescription medicines only as told by your health care provider.  Use a cool mist humidifier to add humidity to the air in your home. This can make breathing easier.  Rest as needed.  Drink enough fluid to keep your urine clear or pale yellow.  Cover your mouth and nose when you cough or sneeze.  Wash your hands with soap and water often, especially after you cough or sneeze. If soap and water are not available, use hand  sanitizer.  Stay home from work or school as told by your health care provider. Unless you are visiting your health care provider, try to avoid leaving home until your fever has been gone for 24 hours without the use of medicine.  Keep all follow-up visits as told by your health care provider. This is important. How is this prevented?  Getting an annual flu shot is the best way to avoid getting the flu. You may get the flu shot in late summer, fall, or winter. Ask your health care provider when you should get your flu shot.  Wash your hands often or use hand sanitizer often.  Avoid contact with people who are sick during cold and flu season.  Eat a healthy diet, drink plenty of fluids, get enough sleep, and exercise regularly. Contact a health care provider if:  You develop new symptoms.  You have:  Chest pain.  Diarrhea.  A fever.  Your cough gets worse.  You produce more mucus.  You feel nauseous or you vomit. Get help right away if:  You develop shortness of breath or difficulty breathing.  Your skin or nails turn a bluish color.  You have severe pain or stiffness in your neck.  You develop a sudden headache or sudden pain in your face or ear.  You cannot stop vomiting. This information is not intended to replace advice given to you by your health care provider. Make sure you discuss any questions you have with your health care provider. Document Released: 07/12/2000 Document Revised: 12/21/2015 Document Reviewed: 05/09/2015 Elsevier Interactive Patient Education  2017 [employer].

## 2016-09-03 NOTE — Progress Notes (Signed)
Medical screening examination/treatment/procedure(s) were performed by non-physician practitioner and as supervising physician I was immediately available for consultation/collaboration. I agree with above. Jeremi Losito, DO   

## 2016-11-07 DIAGNOSIS — M7541 Impingement syndrome of right shoulder: Secondary | ICD-10-CM | POA: Diagnosis not present

## 2016-11-08 ENCOUNTER — Other Ambulatory Visit: Payer: 59

## 2016-11-19 ENCOUNTER — Other Ambulatory Visit: Payer: Self-pay | Admitting: Occupational Medicine

## 2016-11-19 ENCOUNTER — Ambulatory Visit: Payer: Self-pay

## 2016-11-19 DIAGNOSIS — M79641 Pain in right hand: Secondary | ICD-10-CM

## 2016-12-28 DIAGNOSIS — J22 Unspecified acute lower respiratory infection: Secondary | ICD-10-CM | POA: Diagnosis not present

## 2017-02-18 DIAGNOSIS — M7541 Impingement syndrome of right shoulder: Secondary | ICD-10-CM | POA: Diagnosis not present

## 2017-03-22 DIAGNOSIS — M549 Dorsalgia, unspecified: Secondary | ICD-10-CM | POA: Diagnosis not present

## 2017-05-27 ENCOUNTER — Telehealth: Payer: Self-pay | Admitting: Primary Care

## 2017-05-27 NOTE — Telephone Encounter (Signed)
Dr. Danise Mina,  Isidoro Donning' patient brought in a Physician Statement Form from the [facility] to be signed, he needs to have by Friday, 05/30/17. I will put in RX tower.

## 2017-05-28 NOTE — Telephone Encounter (Signed)
Spoke with pt notifying him the form is ready to pick up. Expresses his thanks. [Placed form at front office.]

## 2017-05-28 NOTE — Telephone Encounter (Signed)
Filled and in Lisa's box 

## 2017-05-28 NOTE — Telephone Encounter (Signed)
I haven't received this yet. 

## 2017-05-28 NOTE — Telephone Encounter (Signed)
Placed in Dr. G's box.  

## 2017-06-16 ENCOUNTER — Ambulatory Visit (INDEPENDENT_AMBULATORY_CARE_PROVIDER_SITE_OTHER): Payer: 59 | Admitting: Primary Care

## 2017-06-16 ENCOUNTER — Encounter: Payer: Self-pay | Admitting: Primary Care

## 2017-06-16 VITALS — BP 124/80 | HR 71 | Temp 98.4°F | Ht 68.0 in | Wt 159.4 lb

## 2017-06-16 DIAGNOSIS — E781 Pure hyperglyceridemia: Secondary | ICD-10-CM

## 2017-06-16 DIAGNOSIS — J302 Other seasonal allergic rhinitis: Secondary | ICD-10-CM | POA: Diagnosis not present

## 2017-06-16 DIAGNOSIS — Z0000 Encounter for general adult medical examination without abnormal findings: Secondary | ICD-10-CM | POA: Diagnosis not present

## 2017-06-16 LAB — LIPID PANEL
CHOLESTEROL: 201 mg/dL — AB (ref 0–200)
HDL: 39.5 mg/dL (ref >39.00)
Total CHOL/HDL Ratio: 5

## 2017-06-16 LAB — COMPREHENSIVE METABOLIC PANEL
ALBUMIN: 4.2 g/dL (ref 3.5–5.2)
ALK PHOS: 72 U/L (ref 39–117)
ALT: 26 U/L (ref 0–53)
AST: 20 U/L (ref 0–37)
BILIRUBIN TOTAL: 0.5 mg/dL (ref 0.2–1.2)
BUN: 16 mg/dL (ref 6–23)
CALCIUM: 9.2 mg/dL (ref 8.4–10.5)
CO2: 30 mEq/L (ref 19–32)
CREATININE: 1.16 mg/dL (ref 0.40–1.50)
Chloride: 102 mEq/L (ref 96–112)
GFR: 74.87 mL/min (ref >60.00)
Glucose, Bld: 82 mg/dL (ref 70–99)
Potassium: 4.1 mEq/L (ref 3.5–5.1)
Sodium: 138 mEq/L (ref 135–145)
Total Protein: 6.6 g/dL (ref 6.0–8.3)

## 2017-06-16 LAB — LDL CHOLESTEROL, DIRECT: LDL DIRECT: 114 mg/dL

## 2017-06-16 NOTE — Patient Instructions (Signed)
Complete lab work prior to leaving today. I will notify you of your results once received.   Continue exercising. You should be getting 150 minutes of moderate intensity exercise weekly.  It's important to improve your diet by reducing consumption of fast food, fried food, processed snack foods, sugary drinks. Increase consumption of fresh vegetables and fruits, whole grains, water.  Ensure you are drinking 64 ounces of water daily.  Schedule an eye exam at your convenience.  Follow up with me in one year or sooner if needed.  It was a pleasure to see you today!   Preventive Care 18-39 Years, Male Preventive care refers to lifestyle choices and visits with your health care provider that can promote health and wellness. What does preventive care include?  A yearly physical exam. This is also called an annual well check.  Dental exams once or twice a year.  Routine eye exams. Ask your health care provider how often you should have your eyes checked.  Personal lifestyle choices, including: ? Daily care of your teeth and gums. ? Regular physical activity. ? Eating a healthy diet. ? Avoiding tobacco and drug use. ? Limiting alcohol use. ? Practicing safe sex. What happens during an annual well check? The services and screenings done by your health care provider during your annual well check will depend on your age, overall health, lifestyle risk factors, and family history of disease. Counseling Your health care provider may ask you questions about your:  Alcohol use.  Tobacco use.  Drug use.  Emotional well-being.  Home and relationship well-being.  Sexual activity.  Eating habits.  Work and work Statistician.  Screening You may have the following tests or measurements:  Height, weight, and BMI.  Blood pressure.  Lipid and cholesterol levels. These may be checked every 5 years starting at age 102.  Diabetes screening. This is done by checking your blood sugar  (glucose) after you have not eaten for a while (fasting).  Skin check.  Hepatitis C blood test.  Hepatitis B blood test.  Sexually transmitted disease (STD) testing.  Discuss your test results, treatment options, and if necessary, the need for more tests with your health care provider. Vaccines Your health care provider may recommend certain vaccines, such as:  Influenza vaccine. This is recommended every year.  Tetanus, diphtheria, and acellular pertussis (Tdap, Td) vaccine. You may need a Td booster every 10 years.  Varicella vaccine. You may need this if you have not been vaccinated.  HPV vaccine. If you are 47 or younger, you may need three doses over 6 months.  Measles, mumps, and rubella (MMR) vaccine. You may need at least one dose of MMR.You may also need a second dose.  Pneumococcal 13-valent conjugate (PCV13) vaccine. You may need this if you have certain conditions and have not been vaccinated.  Pneumococcal polysaccharide (PPSV23) vaccine. You may need one or two doses if you smoke cigarettes or if you have certain conditions.  Meningococcal vaccine. One dose is recommended if you are age 51-21 years and a first-year college student living in a residence hall, or if you have one of several medical conditions. You may also need additional booster doses.  Hepatitis A vaccine. You may need this if you have certain conditions or if you travel or work in places where you may be exposed to hepatitis A.  Hepatitis B vaccine. You may need this if you have certain conditions or if you travel or work in places where you may be  exposed to hepatitis B.  Haemophilus influenzae type b (Hib) vaccine. You may need this if you have certain risk factors.  Talk to your health care provider about which screenings and vaccines you need and how often you need them. This information is not intended to replace advice given to you by your health care provider. Make sure you discuss any  questions you have with your health care provider. Document Released: 09/10/2001 Document Revised: 04/03/2016 Document Reviewed: 05/16/2015 Elsevier Interactive Patient Education  2017 [employer].

## 2017-06-16 NOTE — Assessment & Plan Note (Signed)
Overall stable, intermittent use of Zyrtec.

## 2017-06-16 NOTE — Progress Notes (Signed)
Subjective:    Patient ID: Carl Moore, male    DOB: [birth year], 36 y.o.   MRN: [id number]  HPI  Carl Moore is a 36 year old male who presents today for complete physical.  Immunizations: -Tetanus: Completed in 2015 -Influenza: Completed in November 2018   Diet: He endorses a poor diet. Breakfast: Fast food Lunch: Fast food, salad Dinner: Meat, pizza, no vegetables Snacks: Cookies Desserts: Twice weekly Beverages: Sweet tea, water, Powerade, orange juice  Exercise: He exercises at the gym four days weekly, one hour at a time. Eye exam: Completed over one year ago. Dental exam: Completes twice annually    Review of Systems  Constitutional: Negative for unexpected weight change.  HENT: Negative for rhinorrhea.   Respiratory: Negative for cough and shortness of breath.   Cardiovascular: Negative for chest pain.  Gastrointestinal: Negative for constipation and diarrhea.  Genitourinary: Negative for difficulty urinating.  Musculoskeletal: Negative for arthralgias and myalgias.  Skin: Negative for rash.  Allergic/Immunologic: Negative for environmental allergies.  Neurological: Negative for dizziness, numbness and headaches.  Psychiatric/Behavioral:       Denies concerns for anxiety and depression       Past Medical History:  Diagnosis Date  . Chicken pox      Social History   Socioeconomic History  . Marital status: Married    Spouse name: Not on file  . Number of children: Not on file  . Years of education: Not on file  . Highest education level: Not on file  Social Needs  . Financial resource strain: Not on file  . Food insecurity - worry: Not on file  . Food insecurity - inability: Not on file  . Transportation needs - medical: Not on file  . Transportation needs - non-medical: Not on file  Occupational History  . Not on file  Tobacco Use  . Smoking status: Never Smoker  . Smokeless tobacco: Never Used  Substance and Sexual Activity  .  Alcohol use: No    Alcohol/week: 0.0 oz  . Drug use: No  . Sexual activity: Not on file  Other Topics Concern  . 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   No children.   Enjoys playing golf.    Past Surgical History:  Procedure Laterality Date  . KNEE ARTHROSCOPY Left   . ULNAR NERVE REPAIR Left     Family History  Problem Relation Age of Onset  . Hypertension Mother   . Stroke Father        Deceased  . Hypertension Father     No Known Allergies  Current Outpatient Medications on File Prior to Visit  Medication Sig Dispense Refill  . cetirizine (ZYRTEC) 10 MG tablet Take 10 mg by mouth daily.     No current facility-administered medications on file prior to visit.     BP 124/80   Pulse 71   Temp 98.4 F (36.9 C) (Oral)   Ht 5\' 8"  (1.727 m)   Wt 159 lb 6.4 oz (72.3 kg)   SpO2 99%   BMI 24.24 kg/m    Objective:   Physical Exam  Constitutional: He is oriented to person, place, and time. He appears well-nourished.  HENT:  Right Ear: Tympanic membrane and ear canal normal.  Left Ear: Tympanic membrane and ear canal normal.  Nose: Nose normal. Right sinus exhibits no maxillary sinus tenderness and no frontal sinus tenderness. Left sinus exhibits no maxillary sinus  tenderness and no frontal sinus tenderness.  Mouth/Throat: Oropharynx is clear and moist.  Eyes: Conjunctivae and EOM are normal. Pupils are equal, round, and reactive to light.  Neck: Neck supple. Carotid bruit is not present. No thyromegaly present.  Cardiovascular: Normal rate, regular rhythm and normal heart sounds.  Pulmonary/Chest: Effort normal and breath sounds normal. He has no wheezes. He has no rales.  Abdominal: Soft. Bowel sounds are normal. There is no tenderness.  Musculoskeletal: Normal range of motion.  Neurological: He is alert and oriented to person, place, and time. He has normal reflexes. No cranial nerve deficit.  Skin: Skin is warm and  dry.  Psychiatric: He has a normal mood and affect.          Assessment & Plan:

## 2017-06-16 NOTE — Assessment & Plan Note (Signed)
Labs pending. Recommended a low fat diet without fried/greasy foods.

## 2017-06-16 NOTE — Assessment & Plan Note (Signed)
Immunizations UTD. Recommended to reduce fast food, increase vegetables, fruit, whole grains. Exam unremarkable. Labs pending. Follow up in 1 year.

## 2017-06-17 ENCOUNTER — Other Ambulatory Visit: Payer: Self-pay | Admitting: Primary Care

## 2017-06-17 DIAGNOSIS — E781 Pure hyperglyceridemia: Secondary | ICD-10-CM

## 2017-07-21 DIAGNOSIS — M545 Low back pain: Secondary | ICD-10-CM | POA: Diagnosis not present

## 2017-07-21 DIAGNOSIS — M546 Pain in thoracic spine: Secondary | ICD-10-CM | POA: Diagnosis not present

## 2017-08-22 ENCOUNTER — Other Ambulatory Visit: Payer: Self-pay | Admitting: Internal Medicine

## 2017-08-22 ENCOUNTER — Ambulatory Visit
Admission: RE | Admit: 2017-08-22 | Discharge: 2017-08-22 | Disposition: A | Discharge status: Home / Self Care | Payer: Worker's Compensation | Source: Ambulatory Visit | Attending: Internal Medicine | Admitting: Internal Medicine

## 2017-08-22 DIAGNOSIS — M25512 Pain in left shoulder: Secondary | ICD-10-CM

## 2017-08-26 ENCOUNTER — Telehealth: Payer: Self-pay | Admitting: Primary Care

## 2017-08-26 NOTE — Telephone Encounter (Signed)
Patient completed CPE in November 2018. Approved paperwork which is signed and placed in Carl Moore's inbox.

## 2017-08-26 NOTE — Telephone Encounter (Signed)
Place form/paperwork in Kate's inbox for review and complete. 

## 2017-08-26 NOTE — Telephone Encounter (Signed)
Patient dropped off [place] [facility] Physician Statement Form for Carl Moore to sign.  Patient had a physical in [place].  Patient is asking to be able to pick up form by Thursday.  Please call patient when form is ready for pick up. Form is in rx tower.

## 2017-08-27 NOTE — Telephone Encounter (Signed)
Spoken and notified patient of Carl Moore's comments. Form is left in front office for pick up.

## 2017-09-01 DIAGNOSIS — M2022 Hallux rigidus, left foot: Secondary | ICD-10-CM | POA: Diagnosis not present

## 2017-09-01 DIAGNOSIS — M79672 Pain in left foot: Secondary | ICD-10-CM | POA: Diagnosis not present

## 2017-09-01 DIAGNOSIS — M79671 Pain in right foot: Secondary | ICD-10-CM | POA: Diagnosis not present

## 2017-09-09 DIAGNOSIS — M7541 Impingement syndrome of right shoulder: Secondary | ICD-10-CM | POA: Diagnosis not present

## 2017-09-09 DIAGNOSIS — M13811 Other specified arthritis, right shoulder: Secondary | ICD-10-CM | POA: Diagnosis not present

## 2017-09-09 DIAGNOSIS — M25511 Pain in right shoulder: Secondary | ICD-10-CM | POA: Diagnosis not present

## 2017-09-09 HISTORY — DX: Impingement syndrome of right shoulder: M75.41

## 2017-10-06 ENCOUNTER — Other Ambulatory Visit: Payer: Self-pay | Admitting: Orthopedic Surgery

## 2017-10-06 DIAGNOSIS — M25512 Pain in left shoulder: Secondary | ICD-10-CM

## 2017-10-06 DIAGNOSIS — R200 Anesthesia of skin: Secondary | ICD-10-CM

## 2017-10-06 DIAGNOSIS — R531 Weakness: Secondary | ICD-10-CM

## 2017-10-20 ENCOUNTER — Ambulatory Visit
Admission: RE | Admit: 2017-10-20 | Discharge: 2017-10-20 | Disposition: A | Discharge status: Home / Self Care | Payer: 59 | Source: Ambulatory Visit | Attending: Orthopedic Surgery | Admitting: Orthopedic Surgery

## 2017-10-20 DIAGNOSIS — R531 Weakness: Secondary | ICD-10-CM

## 2017-10-20 DIAGNOSIS — R200 Anesthesia of skin: Secondary | ICD-10-CM

## 2017-10-20 DIAGNOSIS — M25512 Pain in left shoulder: Secondary | ICD-10-CM

## 2017-10-20 MED ORDER — IOPAMIDOL (ISOVUE-M 200) INJECTION 41%
15.0000 mL | Freq: Once | INTRAMUSCULAR | Status: AC
  Administered 2017-10-20: 15 mL via INTRA_ARTICULAR

## 2017-10-21 DIAGNOSIS — M25512 Pain in left shoulder: Secondary | ICD-10-CM | POA: Insufficient documentation

## 2017-12-18 ENCOUNTER — Other Ambulatory Visit: Payer: Self-pay | Admitting: Nurse Practitioner

## 2017-12-18 ENCOUNTER — Ambulatory Visit
Admission: RE | Admit: 2017-12-18 | Discharge: 2017-12-18 | Disposition: A | Discharge status: Home / Self Care | Payer: Worker's Compensation | Source: Ambulatory Visit | Attending: Nurse Practitioner | Admitting: Nurse Practitioner

## 2017-12-18 DIAGNOSIS — M25562 Pain in left knee: Secondary | ICD-10-CM

## 2017-12-18 DIAGNOSIS — M25572 Pain in left ankle and joints of left foot: Secondary | ICD-10-CM

## 2018-02-13 ENCOUNTER — Other Ambulatory Visit: Payer: Self-pay | Admitting: Family Medicine

## 2018-02-13 ENCOUNTER — Ambulatory Visit: Payer: Self-pay

## 2018-02-13 DIAGNOSIS — M79632 Pain in left forearm: Secondary | ICD-10-CM

## 2018-02-15 ENCOUNTER — Emergency Department (HOSPITAL_COMMUNITY)
Admission: EM | Admit: 2018-02-15 | Discharge: 2018-02-15 | Disposition: A | Discharge status: Home / Self Care | Payer: 59 | Attending: Emergency Medicine | Admitting: Emergency Medicine

## 2018-02-15 ENCOUNTER — Emergency Department (HOSPITAL_COMMUNITY): Payer: 59

## 2018-02-15 ENCOUNTER — Other Ambulatory Visit: Payer: Self-pay

## 2018-02-15 ENCOUNTER — Encounter (HOSPITAL_COMMUNITY): Payer: Self-pay | Admitting: *Deleted

## 2018-02-15 DIAGNOSIS — Z79899 Other long term (current) drug therapy: Secondary | ICD-10-CM | POA: Diagnosis not present

## 2018-02-15 DIAGNOSIS — R945 Abnormal results of liver function studies: Secondary | ICD-10-CM | POA: Insufficient documentation

## 2018-02-15 DIAGNOSIS — R7989 Other specified abnormal findings of blood chemistry: Secondary | ICD-10-CM

## 2018-02-15 DIAGNOSIS — R1031 Right lower quadrant pain: Secondary | ICD-10-CM

## 2018-02-15 LAB — CBC WITH DIFFERENTIAL/PLATELET
BASOS ABS: 0 10*3/uL (ref 0.0–0.1)
Basophils Relative: 0 %
Eosinophils Absolute: 0.1 10*3/uL (ref 0.0–0.7)
Eosinophils Relative: 1 %
HCT: 48.6 % (ref 39.0–52.0)
Hemoglobin: 15.9 g/dL (ref 13.0–17.0)
LYMPHS ABS: 1.8 10*3/uL (ref 0.7–4.0)
LYMPHS PCT: 20 %
MCH: 28.5 pg (ref 26.0–34.0)
MCHC: 32.7 g/dL (ref 30.0–36.0)
MCV: 87.3 fL (ref 78.0–100.0)
Monocytes Absolute: 0.5 10*3/uL (ref 0.1–1.0)
Monocytes Relative: 6 %
NEUTROS PCT: 73 %
Neutro Abs: 6.5 10*3/uL (ref 1.7–7.7)
Platelets: 508 10*3/uL — ABNORMAL HIGH (ref 150–400)
RBC: 5.57 MIL/uL (ref 4.22–5.81)
RDW: 13.9 % (ref 11.5–15.5)
WBC: 8.9 10*3/uL (ref 4.0–10.5)

## 2018-02-15 LAB — COMPREHENSIVE METABOLIC PANEL
ALT: 270 U/L — ABNORMAL HIGH (ref 0–44)
AST: 96 U/L — ABNORMAL HIGH (ref 15–41)
Albumin: 3.2 g/dL — ABNORMAL LOW (ref 3.5–5.0)
Alkaline Phosphatase: 95 U/L (ref 38–126)
Anion gap: 8 (ref 5–15)
BUN: 10 mg/dL (ref 6–20)
CO2: 25 mmol/L (ref 22–32)
Calcium: 8.3 mg/dL — ABNORMAL LOW (ref 8.9–10.3)
Chloride: 102 mmol/L (ref 98–111)
Creatinine, Ser: 1.23 mg/dL (ref 0.61–1.24)
GFR calc Af Amer: >60 mL/min (ref >60)
GFR calc non Af Amer: >60 mL/min (ref >60)
GLUCOSE: 117 mg/dL — AB (ref 70–99)
Potassium: 3.8 mmol/L (ref 3.5–5.1)
Sodium: 135 mmol/L (ref 135–145)
Total Bilirubin: 2.3 mg/dL — ABNORMAL HIGH (ref 0.3–1.2)
Total Protein: 6.8 g/dL (ref 6.5–8.1)

## 2018-02-15 LAB — URINALYSIS, ROUTINE W REFLEX MICROSCOPIC
Bilirubin Urine: NEGATIVE
Glucose, UA: NEGATIVE mg/dL
Hgb urine dipstick: NEGATIVE
Ketones, ur: NEGATIVE mg/dL
Leukocytes, UA: NEGATIVE
Nitrite: NEGATIVE
Protein, ur: NEGATIVE mg/dL
Specific Gravity, Urine: 1.008 (ref 1.005–1.030)
pH: 7 (ref 5.0–8.0)

## 2018-02-15 LAB — LIPASE, BLOOD: Lipase: 35 U/L (ref 11–51)

## 2018-02-15 MED ORDER — ONDANSETRON HCL 4 MG/2ML IJ SOLN
4.0000 mg | Freq: Once | INTRAMUSCULAR | Status: AC
  Administered 2018-02-15: 4 mg via INTRAVENOUS
  Filled 2018-02-15: qty 2

## 2018-02-15 MED ORDER — MORPHINE SULFATE (PF) 4 MG/ML IV SOLN
4.0000 mg | Freq: Once | INTRAVENOUS | Status: AC
  Administered 2018-02-15: 4 mg via INTRAVENOUS
  Filled 2018-02-15: qty 1

## 2018-02-15 MED ORDER — IOPAMIDOL (ISOVUE-300) INJECTION 61%
INTRAVENOUS | Status: AC
  Filled 2018-02-15: qty 100

## 2018-02-15 MED ORDER — ONDANSETRON HCL 4 MG PO TABS: 4.0000 mg | ORAL_TABLET | Freq: Four times a day (QID) | ORAL | 0 refills | Status: DC

## 2018-02-15 MED ORDER — HYDROCODONE-ACETAMINOPHEN 5-325 MG PO TABS: 1.0000 | ORAL_TABLET | Freq: Four times a day (QID) | ORAL | 0 refills | Status: DC | PRN

## 2018-02-15 MED ORDER — IOPAMIDOL (ISOVUE-300) INJECTION 61%
100.0000 mL | Freq: Once | INTRAVENOUS | Status: AC | PRN
  Administered 2018-02-15: 100 mL via INTRAVENOUS

## 2018-02-15 NOTE — Discharge Instructions (Signed)
Take ibuprofen as needed for mild to moderate pain. Use norco as needed for severe or breakthrough pain.  Use Zofran as needed for nausea or vomiting.   Make sure you are staying well-hydrated with water. Follow-up with the stomach/GI doctors for further evaluation of your liver enzymes and your symptoms. Follow-up with your primary care doctor as needed if your pain continues but does not worsen. Return to the emergency room if you develop high fevers, persistent vomiting despite medication, severe/worsening pain, or any new, worsening, or concerning symptoms.

## 2018-02-15 NOTE — ED Triage Notes (Signed)
Onset of RLQ pain this afternoon, went to [facility] sent her due to possible appendicitis. RLQ tenderness and pain.

## 2018-02-15 NOTE — ED Notes (Signed)
Kennedy from lab was contacted to apply lab orders to blood work that was previously sent down.

## 2018-02-15 NOTE — ED Provider Notes (Signed)
[facility] DEPT Provider Note   CSN: [id number] Arrival date & time: 02/15/18  1843     History   Chief Complaint Chief Complaint  Patient presents with  . Abdominal Pain    Possible Appendicitis    HPI Carl Moore is a 37 y.o. male presenting for evaluation of right lower quadrant pain.  Patient states that acutely at 4:00 this afternoon he had severe right lower quadrant abdominal pain.  Since then, pain is worsened.  It does not radiate.  It is constant and sharp.  He was evaluated at urgent care, there were concerns for possible appendicitis and sent to the emergency room for further evaluation.  He reports intermittent episodes of nausea, no vomiting.  Temperature of 99.3 last night at home, no other fever noted.  He denies history of similar pain.  He has a history of kidney stones, although states this feels different.  UA at urgent care was without blood.  Patient states he has no other medical problems, takes only ibuprofen as needed for pain.  No other medications.  He denies history of abdominal problems or abdominal surgeries.  No tenderness or pain of his scrotum or penis.  No penile discharge.  No change in pain with urination or bowel movements.  Nothing makes the pain better, palpation makes it worse. Last ate at 1 PM.  HPI  Past Medical History:  Diagnosis Date  . Chicken pox     Patient Active Problem List   Diagnosis Date Noted  . Hypertriglyceridemia 05/10/2016  . Preventative health care 03/24/2015  . Seasonal allergies 12/19/2014    Past Surgical History:  Procedure Laterality Date  . KNEE ARTHROSCOPY Left   . ULNAR NERVE REPAIR Left         Home Medications    Prior to Admission medications   Medication Sig Start Date End Date Taking? Authorizing Provider  cetirizine (ZYRTEC) 10 MG tablet Take 10 mg by mouth as needed for allergies.    Yes [provider]  cyclobenzaprine (FLEXERIL) 10 MG tablet  Take 10 mg by mouth as needed. 12/12/17  Yes [provider]  NONFORMULARY OR COMPOUNDED ITEM Apply 1 application topically as needed (pain).   Yes [provider]  HYDROcodone-acetaminophen (NORCO/VICODIN) 5-325 MG tablet Take 1 tablet by mouth every 6 (six) hours as needed for severe pain. 02/15/18   Shabazz Mckey, PA-C  ondansetron (ZOFRAN) 4 MG tablet Take 1 tablet (4 mg total) by mouth every 6 (six) hours. 02/15/18   Rasa Degrazia, PA-C    Family History Family History  Problem Relation Age of Onset  . Hypertension Mother   . Stroke Father        Deceased  . Hypertension Father     Social History Social History   Tobacco Use  . Smoking status: Never Smoker  . Smokeless tobacco: Never Used  Substance Use Topics  . Alcohol use: No    Alcohol/week: 0.0 oz  . Drug use: No     Allergies   Patient has no known allergies.   Review of Systems Review of Systems  Gastrointestinal: Positive for abdominal pain and nausea.  All other systems reviewed and are negative.    Physical Exam Updated Vital Signs BP (!) 148/86 (BP Location: Right Arm)   Pulse 86   Temp 98.1 F (36.7 C) (Oral)   Resp 20   Ht 5\' 6"  (1.676 m)   Wt 76.7 kg (169 lb)   SpO2 98%  BMI 27.28 kg/m   Physical Exam  Constitutional: He is oriented to person, place, and time. He appears well-developed and well-nourished. No distress.  Appears uncomfortable due to pain, in no distress  HENT:  Head: Normocephalic and atraumatic.  Eyes: Pupils are equal, round, and reactive to light. Conjunctivae and EOM are normal.  Neck: Normal range of motion. Neck supple.  Cardiovascular: Normal rate, regular rhythm and intact distal pulses.  Pulmonary/Chest: Effort normal and breath sounds normal. No respiratory distress. He has no wheezes.  Abdominal: Soft. Bowel sounds are normal. He exhibits no distension and no mass. There is tenderness. There is no rebound.  Tenderness palpation of right  lower quadrant.  Voluntary guarding without rebound.  Positive Rovsing's.  Negative Murphy's.  No CVA tenderness  Musculoskeletal: Normal range of motion.  Neurological: He is alert and oriented to person, place, and time.  Skin: Skin is warm and dry. Capillary refill takes less than 2 seconds.  Psychiatric: He has a normal mood and affect.  Nursing note and vitals reviewed.    ED Treatments / Results  Labs (all labs ordered are listed, but only abnormal results are displayed) Labs Reviewed  CBC WITH DIFFERENTIAL/PLATELET - Abnormal; Notable for the following components:      Result Value   Platelets 508 (*)    All other components within normal limits  COMPREHENSIVE METABOLIC PANEL - Abnormal; Notable for the following components:   Glucose, Bld 117 (*)    Calcium 8.3 (*)    Albumin 3.2 (*)    AST 96 (*)    ALT 270 (*)    Total Bilirubin 2.3 (*)    All other components within normal limits  LIPASE, BLOOD  URINALYSIS, ROUTINE W REFLEX MICROSCOPIC    EKG None  Radiology Ct Abdomen Pelvis W Contrast  Result Date: 02/15/2018 CLINICAL DATA:  Right lower quadrant pain EXAM: CT ABDOMEN AND PELVIS WITH CONTRAST TECHNIQUE: Multidetector CT imaging of the abdomen and pelvis was performed using the standard protocol following bolus administration of intravenous contrast. CONTRAST:  169mL ISOVUE-300 IOPAMIDOL (ISOVUE-300) INJECTION 61% COMPARISON:  05/15/2015 FINDINGS: Lower chest: Lung bases demonstrate no acute airspace disease or effusion. The heart size is within normal limits. Hepatobiliary: No focal liver abnormality is seen. No gallstones, gallbladder wall thickening, or biliary dilatation. Pancreas: Unremarkable. No pancreatic ductal dilatation or surrounding inflammatory changes. Spleen: Normal in size without focal abnormality. Adrenals/Urinary Tract: Adrenal glands are within normal limits. No hydronephrosis. Punctate stone lower pole right kidney. The bladder is unremarkable  Stomach/Bowel: Stomach is within normal limits. Appendix appears normal. No evidence of bowel wall thickening, distention, or inflammatory changes. Mildly prominent submucosal fat deposition within the hepatic flexure and transverse colon suggesting chronic inflammation. Slightly prominent jejunal loops but without convincing evidence for bowel obstruction. Vascular/Lymphatic: No significant vascular findings are present. No enlarged abdominal or pelvic lymph nodes. Reproductive: Prostate is unremarkable. Other: Negative for free air or free fluid. 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 Negative for acute appendicitis. No acute inflammatory process in the right lower quadrant. 2. Nonobstructing stone in the right kidney 3. Slightly enlarged appearing jejunal bowel loop in the left abdomen but without convincing evidence for bowel obstruction Electronically Signed   By: Donavan Foil M.D.   On: 02/15/2018 21:00    Procedures Procedures (including critical care time)  Medications Ordered in ED Medications  iopamidol (ISOVUE-300) 61 % injection (has no administration in time range)  morphine 4 MG/ML injection 4 mg (4  mg Intravenous Given 02/15/18 2004)  ondansetron [facility]) injection 4 mg (4 mg Intravenous Given 02/15/18 2003)  iopamidol (ISOVUE-300) 61 % injection 100 mL (100 mLs Intravenous Contrast Given 02/15/18 2028)     Initial Impression / Assessment and Plan / ED Course  I have reviewed the triage vital signs and the nursing notes.  Pertinent labs & imaging results that were available during my care of the patient were reviewed by me and considered in my medical decision making (see chart for details).     Patient presenting for evaluation of right lower quadrant pain.  Physical exam shows patient who appears uncomfortable due to pain, but in no acute distress.  Tenderness palpation of right lower quadrant with voluntary guarding, no rebound.  Positive Rovsing's.   Will obtain labs, urine, treat symptomatically with morphine and Zofran.  CT abdomen to rule out appendicitis.  Will get repeat UA here, as I cannot see results from urgent care.  Labs reassuring, no leukocytosis.  Creatinine stable.  Hemoglobin stable.  Liver enzymes and bilirubin mildly elevated.  Pain remains in the right lower quadrant, clinically higher suspicion for appendicitis despite labs.  CT pending.  CT without acute findings of appendicitis or cholecystitis.  No definitive cause for patient's symptoms.  On reassessment, patient's pain and symptoms are much improved with medication.  Discussed findings.  Discussed that this time, no obvious source for her symptoms.  Discussed that this may be viral.  Patient denies recent travel, no diarrhea, doubt hepatitis.  Denies alcohol use or IV drug use.  Patient instructed to follow-up with GI for further evaluation of his liver enzymes.  Follow-up with PCP as needed for continued abdominal pain.  Will discharge with symptom medic treatments.  At this time, patient appears safe for discharge.  Return precautions given.  Patient states he understands and agrees to plan.   Final Clinical Impressions(s) / ED Diagnoses   Final diagnoses:  Right lower quadrant abdominal pain  Elevated LFTs    ED Discharge Orders        Ordered    HYDROcodone-acetaminophen (NORCO/VICODIN) 5-325 MG tablet  Every 6 hours PRN     02/15/18 2132    ondansetron (ZOFRAN) 4 MG tablet  Every 6 hours     02/15/18 2132       Franchot Heidelberg, PA-C 02/15/18 2307    Valarie Merino, MD 02/15/18 2322

## 2018-02-20 ENCOUNTER — Ambulatory Visit: Payer: Self-pay | Admitting: Gastroenterology

## 2018-02-25 ENCOUNTER — Ambulatory Visit: Payer: Self-pay | Admitting: Family Medicine

## 2018-02-25 ENCOUNTER — Ambulatory Visit: Payer: 59 | Admitting: Internal Medicine

## 2018-02-25 ENCOUNTER — Encounter: Payer: Self-pay | Admitting: Internal Medicine

## 2018-02-25 VITALS — BP 126/82 | HR 97 | Temp 98.7°F | Wt 164.0 lb

## 2018-02-25 DIAGNOSIS — L299 Pruritus, unspecified: Secondary | ICD-10-CM

## 2018-02-25 MED ORDER — METHYLPREDNISOLONE ACETATE 80 MG/ML IJ SUSP
80.0000 mg | Freq: Once | INTRAMUSCULAR | Status: AC
  Administered 2018-02-25: 80 mg via INTRAMUSCULAR

## 2018-02-25 MED ORDER — PREDNISONE 10 MG PO TABS: ORAL_TABLET | ORAL | 0 refills | Status: DC

## 2018-02-25 NOTE — Patient Instructions (Signed)
Pruritus  Pruritus is an itching feeling. There are many different conditions and factors that can make your skin itchy. Dry skin is one of the most common causes of itching. Most cases of itching do not require medical attention. Itchy skin can turn into a rash.  Follow these instructions at home:  Watch your pruritus for any changes. Take these steps to help with your condition:  Skin Care  · Moisturize your skin as needed. A moisturizer that contains petroleum jelly is best for keeping moisture in your skin.  · Take or apply medicines only as directed by your health care provider. This may include:  ? Corticosteroid cream.  ? Anti-itch lotions.  ? Oral anti-histamines.  · Apply cool compresses to the affected areas.  · Try taking a bath with:  ? Epsom salts. Follow the instructions on the packaging. You can get these at your local pharmacy or grocery store.  ? Baking soda. Pour a small amount into the bath as directed by your health care provider.  ? Colloidal oatmeal. Follow the instructions on the packaging. You can get this at your local pharmacy or grocery store.  · Try applying baking soda paste to your skin. Stir water into baking soda until it reaches a paste-like consistency.  · Do not scratch your skin.  · Avoid hot showers or baths, which can make itching worse. A cold shower may help with itching as long as you use a moisturizer after.  · Avoid scented soaps, detergents, and perfumes. Use gentle soaps, detergents, perfumes, and other cosmetic products.  General instructions  · Avoid wearing tight clothes.  · Keep a journal to help track what causes your itch. Write down:  ? What you eat.  ? What cosmetic products you use.  ? What you drink.  ? What you wear. This includes jewelry.  · Use a humidifier. This keeps the air moist, which helps to prevent dry skin.  Contact a health care provider if:  · The itching does not go away after several days.  · You sweat at night.  · You have weight loss.  · You  are unusually thirsty.  · You urinate more than normal.  · You are more tired than normal.  · You have abdominal pain.  · Your skin tingles.  · You feel weak.  · Your skin or the whites of your eyes look yellow (jaundice).  · Your skin feels numb.  This information is not intended to replace advice given to you by your health care provider. Make sure you discuss any questions you have with your health care provider.  Document Released: 03/27/2011 Document Revised: 12/21/2015 Document Reviewed: 07/11/2014  Elsevier Interactive Patient Education © 2018 [employer]

## 2018-02-25 NOTE — Addendum Note (Signed)
Addended by: Lurlean Nanny on: 02/25/2018 01:59 PM   Modules accepted: Orders

## 2018-02-25 NOTE — Progress Notes (Signed)
Subjective:    Patient ID: Carl Moore, male    DOB: [birth year], 37 y.o.   MRN: [id number]  HPI  Pt presents to the clinic today with c/o itching to his upper chest and back. He reports this started 2-3 days ago. He has not seen any rash. He does not itch anywhere else. He denies changes in soaps, lotions or detergents. He denies anyone with a rash in his home. He does not have any new pets. He does work outside. He has tried Benadryl without any relief.  Review of Systems      Past Medical History:  Diagnosis Date  . Chicken pox     Current Outpatient Medications  Medication Sig Dispense Refill  . cetirizine (ZYRTEC) 10 MG tablet Take 10 mg by mouth as needed for allergies.     . cyclobenzaprine (FLEXERIL) 10 MG tablet Take 10 mg by mouth as needed.    . NONFORMULARY OR COMPOUNDED ITEM Apply 1 application topically as needed (pain).     No current facility-administered medications for this visit.     No Known Allergies  Family History  Problem Relation Age of Onset  . Hypertension Mother   . Stroke Father        Deceased  . Hypertension Father     Social History   Socioeconomic History  . Marital status: Married    Spouse name: Not on file  . Number of children: Not on file  . Years of education: Not on file  . Highest education level: Not on file  Occupational History  . Not on file  Social Needs  . Financial resource strain: Not on file  . Food insecurity:    Worry: Not on file    Inability: Not on file  . Transportation needs:    Medical: Not on file    Non-medical: Not on file  Tobacco Use  . Smoking status: Never Smoker  . Smokeless tobacco: Never Used  Substance and Sexual Activity  . Alcohol use: No    Alcohol/week: 0.0 oz  . Drug use: No  . Sexual activity: Not on file  Lifestyle  . Physical activity:    Days per week: Not on file    Minutes per session: Not on file  . Stress: Not on file  Relationships  . Social connections:   Talks on phone: Not on file    Gets together: Not on file    Attends religious service: Not on file    Active member of club or organization: Not on file    Attends meetings of clubs or organizations: Not on file    Relationship status: Not on file  . Intimate partner violence:    Fear of current or ex partner: Not on file    Emotionally abused: Not on file    Physically abused: Not on file    Forced sexual activity: Not on file  Other Topics Concern  . 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   No children.   Enjoys playing golf.     Constitutional: Denies fever, malaise, fatigue, headache or abrupt weight changes.  Skin: Pt reports itching. Denies redness, rashes, lesions or ulcercations.    No other specific complaints in a complete review of systems (except as listed in HPI above).  Objective:   Physical Exam   BP 126/82   Pulse 97   Temp 98.7 F (37.1 C) (Oral)  Wt 164 lb (74.4 kg)   SpO2 99%   BMI 26.47 kg/m  Wt Readings from Last 3 Encounters:  02/25/18 164 lb (74.4 kg)  02/15/18 169 lb (76.7 kg)  06/16/17 159 lb 6.4 oz (72.3 kg)    General: Appears his stated age, well developed, well nourished in NAD. Skin: Acne noted on upper chest and back. No rashesnoted.  BMET    Component Value Date/Time   NA 135 02/15/2018 1921   K 3.8 02/15/2018 1921   CL 102 02/15/2018 1921   CO2 25 02/15/2018 1921   GLUCOSE 117 (H) 02/15/2018 1921   BUN 10 02/15/2018 1921   CREATININE 1.23 02/15/2018 1921   CALCIUM 8.3 (L) 02/15/2018 1921   GFRNONAA >60 02/15/2018 1921   GFRAA >60 02/15/2018 1921    Lipid Panel     Component Value Date/Time   CHOL 201 (H) 06/16/2017 1425   TRIG (H) 06/16/2017 1425    476.0 Triglyceride is over 400; calculations on Lipids are invalid.   HDL 39.50 06/16/2017 1425   CHOLHDL 5 06/16/2017 1425   VLDL 45.2 (H) 05/03/2016 1002   LDLCALC 124 (H) 03/16/2015 0830    CBC    Component Value  Date/Time   WBC 8.9 02/15/2018 1921   RBC 5.57 02/15/2018 1921   HGB 15.9 02/15/2018 1921   HCT 48.6 02/15/2018 1921   PLT 508 (H) 02/15/2018 1921   MCV 87.3 02/15/2018 1921   MCH 28.5 02/15/2018 1921   MCHC 32.7 02/15/2018 1921   RDW 13.9 02/15/2018 1921   LYMPHSABS 1.8 02/15/2018 1921   MONOABS 0.5 02/15/2018 1921   EOSABS 0.1 02/15/2018 1921   BASOSABS 0.0 02/15/2018 1921    Hgb A1C Lab Results  Component Value Date   HGBA1C 5.2 03/16/2015           Assessment & Plan:   Pruritis:  80 mg Depo IM today eRx for Pred Taper x 6 days Can continue Benadryl as needed  Return precautions discussed Webb Silversmith, NP

## 2018-02-26 HISTORY — PX: OTHER SURGICAL HISTORY: SHX169

## 2018-04-22 ENCOUNTER — Encounter: Payer: Self-pay | Admitting: *Deleted

## 2018-04-22 ENCOUNTER — Ambulatory Visit: Payer: Self-pay | Admitting: Gastroenterology

## 2018-04-23 ENCOUNTER — Other Ambulatory Visit (HOSPITAL_COMMUNITY): Payer: Self-pay | Admitting: Orthopedic Surgery

## 2018-04-30 ENCOUNTER — Other Ambulatory Visit: Payer: Self-pay

## 2018-04-30 ENCOUNTER — Encounter (HOSPITAL_BASED_OUTPATIENT_CLINIC_OR_DEPARTMENT_OTHER): Payer: Self-pay | Admitting: *Deleted

## 2018-05-07 ENCOUNTER — Encounter (HOSPITAL_BASED_OUTPATIENT_CLINIC_OR_DEPARTMENT_OTHER)
Admission: RE | Disposition: A | Discharge status: Home / Self Care | Payer: Self-pay | Source: Ambulatory Visit | Attending: Orthopedic Surgery

## 2018-05-07 ENCOUNTER — Ambulatory Visit (HOSPITAL_BASED_OUTPATIENT_CLINIC_OR_DEPARTMENT_OTHER)
Admission: RE | Admit: 2018-05-07 | Discharge: 2018-05-07 | Disposition: A | Discharge status: Home / Self Care | Payer: Managed Care, Other (non HMO) | Source: Ambulatory Visit | Attending: Orthopedic Surgery | Admitting: Orthopedic Surgery

## 2018-05-07 ENCOUNTER — Ambulatory Visit (HOSPITAL_BASED_OUTPATIENT_CLINIC_OR_DEPARTMENT_OTHER): Payer: Managed Care, Other (non HMO) | Admitting: Anesthesiology

## 2018-05-07 DIAGNOSIS — Z79899 Other long term (current) drug therapy: Secondary | ICD-10-CM | POA: Diagnosis not present

## 2018-05-07 DIAGNOSIS — M2022 Hallux rigidus, left foot: Secondary | ICD-10-CM | POA: Diagnosis present

## 2018-05-07 SURGERY — CHEILECTOMY, GREAT TOE, WITH IMPLANT INSERTION
Anesthesia: General | Laterality: Left

## 2018-05-07 MED ORDER — DEXAMETHASONE SODIUM PHOSPHATE 10 MG/ML IJ SOLN
INTRAMUSCULAR | Status: AC
  Filled 2018-05-07: qty 1

## 2018-05-07 MED ORDER — SODIUM CHLORIDE 0.9 % IV SOLN: INTRAVENOUS | Status: DC

## 2018-05-07 MED ORDER — OXYCODONE HCL 5 MG PO TABS: 5.0000 mg | ORAL_TABLET | Freq: Four times a day (QID) | ORAL | 0 refills | Status: AC | PRN

## 2018-05-07 MED ORDER — 0.9 % SODIUM CHLORIDE (POUR BTL) OPTIME
TOPICAL | Status: DC | PRN
  Administered 2018-05-07: 1000 mL

## 2018-05-07 MED ORDER — MIDAZOLAM HCL 2 MG/2ML IJ SOLN
1.0000 mg | INTRAMUSCULAR | Status: DC | PRN
  Administered 2018-05-07: 2 mg via INTRAVENOUS

## 2018-05-07 MED ORDER — SCOPOLAMINE 1 MG/3DAYS TD PT72: 1.0000 | MEDICATED_PATCH | Freq: Once | TRANSDERMAL | Status: DC | PRN

## 2018-05-07 MED ORDER — CEFAZOLIN SODIUM-DEXTROSE 2-4 GM/100ML-% IV SOLN
INTRAVENOUS | Status: AC
  Filled 2018-05-07: qty 100

## 2018-05-07 MED ORDER — LIDOCAINE 2% (20 MG/ML) 5 ML SYRINGE
INTRAMUSCULAR | Status: DC | PRN
  Administered 2018-05-07: 60 mg via INTRAVENOUS

## 2018-05-07 MED ORDER — LACTATED RINGERS IV SOLN
INTRAVENOUS | Status: DC
  Administered 2018-05-07: 10:00:00 via INTRAVENOUS

## 2018-05-07 MED ORDER — PROPOFOL 10 MG/ML IV BOLUS
INTRAVENOUS | Status: DC | PRN
  Administered 2018-05-07: 180 mg via INTRAVENOUS

## 2018-05-07 MED ORDER — PROPOFOL 500 MG/50ML IV EMUL
INTRAVENOUS | Status: AC
  Filled 2018-05-07: qty 50

## 2018-05-07 MED ORDER — FENTANYL CITRATE (PF) 100 MCG/2ML IJ SOLN
50.0000 ug | INTRAMUSCULAR | Status: DC | PRN
  Administered 2018-05-07: 100 ug via INTRAVENOUS

## 2018-05-07 MED ORDER — CHLORHEXIDINE GLUCONATE 4 % EX LIQD: 60.0000 mL | Freq: Once | CUTANEOUS | Status: DC

## 2018-05-07 MED ORDER — FENTANYL CITRATE (PF) 100 MCG/2ML IJ SOLN
INTRAMUSCULAR | Status: AC
  Filled 2018-05-07: qty 2

## 2018-05-07 MED ORDER — ONDANSETRON HCL 4 MG/2ML IJ SOLN
INTRAMUSCULAR | Status: DC | PRN
  Administered 2018-05-07: 4 mg via INTRAVENOUS

## 2018-05-07 MED ORDER — MIDAZOLAM HCL 2 MG/2ML IJ SOLN
INTRAMUSCULAR | Status: AC
  Filled 2018-05-07: qty 2

## 2018-05-07 MED ORDER — ONDANSETRON HCL 4 MG/2ML IJ SOLN
INTRAMUSCULAR | Status: AC
  Filled 2018-05-07: qty 2

## 2018-05-07 MED ORDER — LIDOCAINE 2% (20 MG/ML) 5 ML SYRINGE
INTRAMUSCULAR | Status: AC
  Filled 2018-05-07: qty 5

## 2018-05-07 MED ORDER — CEFAZOLIN SODIUM-DEXTROSE 2-4 GM/100ML-% IV SOLN
2.0000 g | INTRAVENOUS | Status: AC
  Administered 2018-05-07: 2 g via INTRAVENOUS

## 2018-05-07 MED ORDER — DEXAMETHASONE SODIUM PHOSPHATE 4 MG/ML IJ SOLN
INTRAMUSCULAR | Status: DC | PRN
  Administered 2018-05-07: 10 mg via INTRAVENOUS

## 2018-05-07 SURGICAL SUPPLY — 56 items
BANDAGE ACE 4X5 VEL STRL LF (GAUZE/BANDAGES/DRESSINGS) ×2 IMPLANT
BANDAGE ESMARK 6X9 LF (GAUZE/BANDAGES/DRESSINGS) ×1 IMPLANT
BLADE SURG 15 STRL LF DISP TIS (BLADE) ×2 IMPLANT
BLADE SURG 15 STRL SS (BLADE) ×2
BNDG COHESIVE 4X5 TAN STRL (GAUZE/BANDAGES/DRESSINGS) ×2 IMPLANT
BNDG CONFORM 2 STRL LF (GAUZE/BANDAGES/DRESSINGS) IMPLANT
BNDG CONFORM 3 STRL LF (GAUZE/BANDAGES/DRESSINGS) ×2 IMPLANT
BNDG ESMARK 4X9 LF (GAUZE/BANDAGES/DRESSINGS) IMPLANT
BNDG ESMARK 6X9 LF (GAUZE/BANDAGES/DRESSINGS) ×2
CHLORAPREP W/TINT 26ML (MISCELLANEOUS) ×2 IMPLANT
COVER BACK TABLE 60X90IN (DRAPES) ×2 IMPLANT
COVER WAND RF STERILE (DRAPES) IMPLANT
CUFF TOURNIQUET SINGLE 34IN LL (TOURNIQUET CUFF) IMPLANT
DRAPE EXTREMITY T 121X128X90 (DRAPE) ×2 IMPLANT
DRAPE OEC MINIVIEW 54X84 (DRAPES) ×2 IMPLANT
DRAPE SURG 17X23 STRL (DRAPES) IMPLANT
DRAPE U-SHAPE 47X51 STRL (DRAPES) ×2 IMPLANT
DRSG MEPITEL 4X7.2 (GAUZE/BANDAGES/DRESSINGS) ×2 IMPLANT
DRSG PAD ABDOMINAL 8X10 ST (GAUZE/BANDAGES/DRESSINGS) ×4 IMPLANT
ELECT REM PT RETURN 9FT ADLT (ELECTROSURGICAL) ×2
ELECTRODE REM PT RTRN 9FT ADLT (ELECTROSURGICAL) ×1 IMPLANT
GAUZE SPONGE 4X4 12PLY STRL (GAUZE/BANDAGES/DRESSINGS) ×2 IMPLANT
GAUZE SPONGE 4X4 12PLY STRL LF (GAUZE/BANDAGES/DRESSINGS) ×2 IMPLANT
GLOVE BIO SURGEON STRL SZ8 (GLOVE) ×2 IMPLANT
GLOVE BIOGEL PI IND STRL 8 (GLOVE) ×1 IMPLANT
GLOVE BIOGEL PI INDICATOR 8 (GLOVE) ×1
GLOVE ECLIPSE 8.0 STRL XLNG CF (GLOVE) IMPLANT
GOWN STRL REUS W/ TWL LRG LVL3 (GOWN DISPOSABLE) ×1 IMPLANT
GOWN STRL REUS W/ TWL XL LVL3 (GOWN DISPOSABLE) ×1 IMPLANT
GOWN STRL REUS W/TWL LRG LVL3 (GOWN DISPOSABLE) ×1
GOWN STRL REUS W/TWL XL LVL3 (GOWN DISPOSABLE) ×1
IMPL MTP CARTIVA 10MM (Orthopedic Implant) ×1 IMPLANT
IMPLANT MTP CARTIVA 10MM (Orthopedic Implant) ×2 IMPLANT
NEEDLE HYPO 25X1 1.5 SAFETY (NEEDLE) IMPLANT
NS IRRIG 1000ML POUR BTL (IV SOLUTION) ×2 IMPLANT
PACK BASIN DAY SURGERY FS (CUSTOM PROCEDURE TRAY) ×2 IMPLANT
PAD CAST 4YDX4 CTTN HI CHSV (CAST SUPPLIES) ×1 IMPLANT
PADDING CAST COTTON 4X4 STRL (CAST SUPPLIES) ×1
PENCIL BUTTON HOLSTER BLD 10FT (ELECTRODE) ×2 IMPLANT
SANITIZER HAND PURELL 535ML FO (MISCELLANEOUS) ×2 IMPLANT
SHEET MEDIUM DRAPE 40X70 STRL (DRAPES) ×2 IMPLANT
SLEEVE SCD COMPRESS KNEE MED (MISCELLANEOUS) ×2 IMPLANT
SPONGE LAP 18X18 RF (DISPOSABLE) ×2 IMPLANT
STOCKINETTE 6  STRL (DRAPES) ×1
STOCKINETTE 6 STRL (DRAPES) ×1 IMPLANT
SUCTION FRAZIER HANDLE 10FR (MISCELLANEOUS)
SUCTION TUBE FRAZIER 10FR DISP (MISCELLANEOUS) IMPLANT
SUT ETHILON 3 0 PS 1 (SUTURE) ×2 IMPLANT
SUT MNCRL AB 3-0 PS2 18 (SUTURE) ×2 IMPLANT
SUT VIC AB 2-0 SH 27 (SUTURE) ×1
SUT VIC AB 2-0 SH 27XBRD (SUTURE) ×1 IMPLANT
SYR BULB 3OZ (MISCELLANEOUS) ×2 IMPLANT
SYR CONTROL 10ML LL (SYRINGE) IMPLANT
TOWEL GREEN STERILE FF (TOWEL DISPOSABLE) ×2 IMPLANT
TUBE CONNECTING 20X1/4 (TUBING) IMPLANT
UNDERPAD 30X30 (UNDERPADS AND DIAPERS) ×2 IMPLANT

## 2018-05-07 NOTE — Anesthesia Procedure Notes (Signed)
Anesthesia Regional Block: Popliteal block   Pre-Anesthetic Checklist: ,, timeout performed, Correct Patient, Correct Site, Correct Laterality, Correct Procedure, Correct Position, site marked, Risks and benefits discussed,  Surgical consent,  Pre-op evaluation,  At surgeon's request and post-op pain management  Laterality: Left  Prep: chloraprep       Needles:  Injection technique: Single-shot  Needle Type: Echogenic Stimulator Needle     Needle Length: 5cm  Needle Gauge: 22     Additional Needles:   Procedures:, nerve stimulator,,, ultrasound used (permanent image in chart),,,,   Nerve Stimulator or Paresthesia:  Response: 0.45 mA,   Additional Responses:   Narrative:  Start time: 05/07/2018 9:47 AM End time: 05/07/2018 9:54 AM Injection made incrementally with aspirations every 5 mL.  Performed by: Personally  Anesthesiologist: Janeece Riggers, MD  Additional Notes: Functioning IV was confirmed and monitors were applied.  A 46mm 22ga Arrow echogenic stimulator needle was used. Sterile prep and drape,hand hygiene and sterile gloves were used. Ultrasound guidance: relevant anatomy identified, needle position confirmed, local anesthetic spread visualized around nerve(s)., vascular puncture avoided.  Image printed for medical record. Negative aspiration and negative test dose prior to incremental administration of local anesthetic. The patient tolerated the procedure well.

## 2018-05-07 NOTE — Anesthesia Preprocedure Evaluation (Signed)
Anesthesia Evaluation  Patient identified by MRN, date of birth, ID band Patient awake    Reviewed: Allergy & Precautions, H&P , NPO status , Patient's Chart, lab work & pertinent test results, reviewed documented beta blocker date and time   Airway Mallampati: II  TM Distance: >3 FB Neck ROM: full    Dental no notable dental hx.    Pulmonary neg pulmonary ROS,    Pulmonary exam normal breath sounds clear to auscultation       Cardiovascular Exercise Tolerance: Good negative cardio ROS   Rhythm:regular Rate:Normal     Neuro/Psych negative neurological ROS  negative psych ROS   GI/Hepatic negative GI ROS, Neg liver ROS,   Endo/Other  negative endocrine ROS  Renal/GU negative Renal ROS  negative genitourinary   Musculoskeletal   Abdominal   Peds  Hematology negative hematology ROS (+)   Anesthesia Other Findings   Reproductive/Obstetrics negative OB ROS                             Anesthesia Physical Anesthesia Plan  ASA: II  Anesthesia Plan: General   Post-op Pain Management: GA combined w/ Regional for post-op pain   Induction: Intravenous  PONV Risk Score and Plan: 2 and Ondansetron, Treatment may vary due to age or medical condition and Dexamethasone  Airway Management Planned: LMA  Additional Equipment:   Intra-op Plan:   Post-operative Plan: Extubation in OR  Informed Consent: I have reviewed the patients History and Physical, chart, labs and discussed the procedure including the risks, benefits and alternatives for the proposed anesthesia with the patient or authorized representative who has indicated his/her understanding and acceptance.   Dental Advisory Given  Plan Discussed with: CRNA, Anesthesiologist and Surgeon  Anesthesia Plan Comments: ( )        Anesthesia Quick Evaluation

## 2018-05-07 NOTE — Anesthesia Postprocedure Evaluation (Signed)
Anesthesia Post Note  Patient: Carl Moore  Procedure(s) Performed: Left hallux metatarsal phalangeal joint cheilectomy and joint resurfacing (Left )     Patient location during evaluation: PACU Anesthesia Type: General Level of consciousness: awake and alert Pain management: pain level controlled Vital Signs Assessment: post-procedure vital signs reviewed and stable Respiratory status: spontaneous breathing, nonlabored ventilation, respiratory function stable and patient connected to nasal cannula oxygen Cardiovascular status: blood pressure returned to baseline and stable Postop Assessment: no apparent nausea or vomiting Anesthetic complications: no    Last Vitals:  Vitals:   05/07/18 1140 05/07/18 1200  BP:    Pulse: 71 71  Resp: 13 16  Temp:  36.7 C  SpO2: 100% 100%    Last Pain:  Vitals:   05/07/18 1200  TempSrc:   PainSc: 0-No pain                 Danna Sewell

## 2018-05-07 NOTE — Op Note (Signed)
05/07/2018  11:09 AM  PATIENT:  Carl Moore  37 y.o. male  PRE-OPERATIVE DIAGNOSIS:  left foot hallux rigidus  POST-OPERATIVE DIAGNOSIS:  left foot hallux rigidus  Procedure(s): Left hallux metatarsal phalangeal joint cheilectomy and joint resurfacing (10 mm Cartiva)  SURGEON:  Wylene Simmer, MD  ASSISTANT: none  ANESTHESIA:   General, regional  EBL:  minimal   TOURNIQUET:   Total Tourniquet Time Documented: Thigh (Left) - 22 minutes Total: Thigh (Left) - 22 minutes  COMPLICATIONS:  None apparent  DISPOSITION:  Extubated, awake and stable to recovery.  INDICATION FOR PROCEDURE: The patient is a 37 year old male with a long history of left forefoot pain due to hallux rigidus.  He has failed nonoperative treatment to date including activity modification, oral anti-inflammatories and shoewear modification.  He presents now for operative treatment of this painful condition.  The risks and benefits of the alternative treatment options have been discussed in detail.  The patient wishes to proceed with surgery and specifically understands risks of bleeding, infection, nerve damage, blood clots, need for additional surgery, amputation and death.  PROCEDURE IN DETAIL:  After pre operative consent was obtained, and the correct operative site was identified, the patient was brought to the operating room and placed supine on the OR table.  Anesthesia was administered.  Pre-operative antibiotics were administered.  A surgical timeout was taken.  The left lower extremity was prepped and draped in standard sterile fashion with a tourniquet around the thigh.  The extremity was elevated, and the tourniquet was inflated to 250 mill meters of mercury.  A longitudinal incision was then made over the hallux MP joint.  Dissection was carried down through the subcutaneous tissues.  The extensor houses longus and brevis tendons were mobilized and protected throughout the case.  The dorsal joint capsule  was incised and elevated medially and laterally.  Dorsal osteophytes were resected with a rondure from the head of the metatarsal and the base of the proximal phalanx.  The collateral ligaments were released.  The sesamoid articulations were probed with a joker elevator and were noted to be mobile.  A guidepin for the Cartiva 10 mm implant was positioned appropriately at the metatarsal head.  It was advanced into the head.  It was then used to ream a 10 mm socket.  The bone fragments were removed and the wound irrigated copiously.  A 10 mm implant was then inserted and seated appropriately.  The joint was reduced.  Dorsiflexion was approximately 45 degrees and plantar flexion was approximately 60 degrees.  The wound was again irrigated copiously.  Joint capsule was repaired with Monocryl.  Subcutaneous tissues were approximated with Monocryl.  The skin incision was closed with nylon.  Sterile dressings were applied followed by a compression wrap.  The tourniquet was released after application of the dressings at 22 minutes.  The patient was awakened from anesthesia and transported to the recovery room in stable condition.   FOLLOW UP PLAN: Weightbearing as tolerated in a flat postop shoe.  Follow-up in the office in 2 weeks for suture removal and to initiate active and passive range of motion.

## 2018-05-07 NOTE — Anesthesia Procedure Notes (Signed)
Performed by: Francisca Langenderfer W, CRNA       

## 2018-05-07 NOTE — Transfer of Care (Signed)
Immediate Anesthesia Transfer of Care Note  Patient: Carl Moore  Procedure(s) Performed: Left hallux metatarsal phalangeal joint cheilectomy and joint resurfacing (Left )  Patient Location: PACU  Anesthesia Type:General and Regional  Level of Consciousness: awake and sedated  Airway & Oxygen Therapy: Patient Spontanous Breathing and Patient connected to face mask oxygen  Post-op Assessment: Report given to RN and Post -op Vital signs reviewed and stable  Post vital signs: Reviewed and stable  Last Vitals:  Vitals Value Taken Time  BP 102/59 05/07/2018 11:00 AM  Temp    Pulse 66 05/07/2018 11:01 AM  Resp 10 05/07/2018 11:01 AM  SpO2 100 % 05/07/2018 11:01 AM  Vitals shown include unvalidated device data.  Last Pain:  Vitals:   05/07/18 0907  TempSrc: Oral  PainSc: 8          Complications: No apparent anesthesia complications

## 2018-05-07 NOTE — Discharge Instructions (Addendum)
Carl Simmer, MD [facility]  Please read the following information regarding your care after surgery.  Medications  You only need a prescription for the narcotic pain medicine (ex. oxycodone, Percocet, Norco).  All of the other medicines listed below are available over the counter. X Aleve 2 pills twice a day for the first 3 days after surgery. X acetominophen (Tylenol) 650 mg every 4-6 hours as you need for minor to moderate pain X oxycodone as prescribed for severe pain  Narcotic pain medicine (ex. oxycodone, Percocet, Vicodin) will cause constipation.  To prevent this problem, take the following medicines while you are taking any pain medicine. X docusate sodium (Colace) 100 mg twice a day X senna (Senokot) 2 tablets twice a day   Weight Bearing X Bear weight when you are able on your operated leg or foot in the post-op shoe.  Cast / Splint / Dressing X Keep your splint, cast or dressing clean and dry.  Dont put anything (coat hanger, pencil, etc) down inside of it.  If it gets damp, use a hair dryer on the cool setting to dry it.  If it gets soaked, call the office to schedule an appointment for a cast change.  After your dressing, cast or splint is removed; you may shower, but do not soak or scrub the wound.  Allow the water to run over it, and then gently pat it dry.  Swelling It is normal for you to have swelling where you had surgery.  To reduce swelling and pain, keep your toes above your nose for at least 3 days after surgery.  It may be necessary to keep your foot or leg elevated for several weeks.  If it hurts, it should be elevated.  Follow Up Call my office at [phone] when you are discharged from the hospital or surgery center to schedule an appointment to be seen two weeks after surgery.  Call my office at [phone] if you develop a fever >101.5 F, nausea, vomiting, bleeding from the surgical site or severe pain.        Post Anesthesia Home Care  Instructions  Activity: Get plenty of rest for the remainder of the day. A responsible individual must stay with you for 24 hours following the procedure.  For the next 24 hours, DO NOT: -Drive a car -Paediatric nurse -Drink alcoholic beverages -Take any medication unless instructed by your physician -Make any legal decisions or sign important papers.  Meals: Start with liquid foods such as gelatin or soup. Progress to regular foods as tolerated. Avoid greasy, spicy, heavy foods. If nausea and/or vomiting occur, drink only clear liquids until the nausea and/or vomiting subsides. Call your physician if vomiting continues.  Special Instructions/Symptoms: Your throat may feel dry or sore from the anesthesia or the breathing tube placed in your throat during surgery. If this causes discomfort, gargle with warm salt water. The discomfort should disappear within 24 hours.  If you had a scopolamine patch placed behind your ear for the management of post- operative nausea and/or vomiting:  1. The medication in the patch is effective for 72 hours, after which it should be removed.  Wrap patch in a tissue and discard in the trash. Wash hands thoroughly with soap and water. 2. You may remove the patch earlier than 72 hours if you experience unpleasant side effects which may include dry mouth, dizziness or visual disturbances. 3. Avoid touching the patch. Wash your hands with soap and water after contact with the patch.  Regional Anesthesia Blocks  1. Numbness or the inability to move the "blocked" extremity may last from 3-48 hours after placement. The length of time depends on the medication injected and your individual response to the medication. If the numbness is not going away after 48 hours, call your surgeon.  2. The extremity that is blocked will need to be protected until the numbness is gone and the  Strength has returned. Because you cannot feel it, you will need to take extra  care to avoid injury. Because it may be weak, you may have difficulty moving it or using it. You may not know what position it is in without looking at it while the block is in effect.  3. For blocks in the legs and feet, returning to weight bearing and walking needs to be done carefully. You will need to wait until the numbness is entirely gone and the strength has returned. You should be able to move your leg and foot normally before you try and bear weight or walk. You will need someone to be with you when you first try to ensure you do not fall and possibly risk injury.  4. Bruising and tenderness at the needle site are common side effects and will resolve in a few days.  5. Persistent numbness or new problems with movement should be communicated to the surgeon or the [facility] [phone] [place] [phone]).

## 2018-05-07 NOTE — Anesthesia Procedure Notes (Signed)
Procedure Name: LMA Insertion Performed by: Terrance Mass, CRNA Pre-anesthesia Checklist: Patient identified, Emergency Drugs available, Suction available and Patient being monitored Patient Re-evaluated:Patient Re-evaluated prior to induction Oxygen Delivery Method: Circle system utilized Preoxygenation: Pre-oxygenation with 100% oxygen Induction Type: IV induction Ventilation: Mask ventilation without difficulty LMA: LMA inserted LMA Size: 5.0 Number of attempts: 1 Airway Equipment and Method: Bite block Placement Confirmation: positive ETCO2 Tube secured with: Tape Dental Injury: Teeth and Oropharynx as per pre-operative assessment

## 2018-05-07 NOTE — H&P (Signed)
Carl Moore is an 37 y.o. male.   Chief Complaint: Left forefoot pain HPI: The patient is a 37 year old male without significant past medical history.  He presents today for surgical treatment of his left hallux rigidus.  He has failed nonoperative treatment to date including activity modification, oral anti-inflammatories and shoewear modification.  Past Medical History:  Diagnosis Date  . Chicken pox     Past Surgical History:  Procedure Laterality Date  . KNEE ARTHROSCOPY Left   . ULNAR NERVE REPAIR Left     Family History  Problem Relation Age of Onset  . Hypertension Mother   . Stroke Father        Deceased  . Hypertension Father    Social History:  reports that he has never smoked. He has never used smokeless tobacco. He reports that he does not drink alcohol or use drugs.  Allergies: No Known Allergies  Medications Prior to Admission  Medication Sig Dispense Refill  . cyclobenzaprine (FLEXERIL) 10 MG tablet Take 10 mg by mouth as needed.    . NONFORMULARY OR COMPOUNDED ITEM Apply 1 application topically as needed (pain).    . cetirizine (ZYRTEC) 10 MG tablet Take 10 mg by mouth as needed for allergies.       No results found for this or any previous visit (from the past 48 hour(s)). No results found.  ROS no recent fever, chills, nausea, vomiting or changes in his appetite  Blood pressure 129/84, pulse 78, temperature 98 F (36.7 C), temperature source Oral, resp. rate 10, height 5\' 6"  (1.676 m), weight 71.6 kg, SpO2 100 %. Physical Exam  Well-nourished well-developed man in no apparent distress.  Alert and oriented x4.  Mood and affect are normal.  Extraocular motions are intact.  Respirations are unlabored.  Gait is normal.  The left hallux has healthy skin and normal sensibility to light touch.  Pulses are palpable in the foot.  Decreased range of motion at the hallux MP joint.  Tender to palpation over the dorsum of the joint.  No  lymphadenopathy.  Assessment/Plan Left hallux rigidus -to the operating room today for left hallux MP joint cheilectomy and resurfacing.  The risks and benefits of the alternative treatment options have been discussed in detail.  The patient wishes to proceed with surgery and specifically understands risks of bleeding, infection, nerve damage, blood clots, need for additional surgery, amputation and death.   Wylene Simmer, MD 20-May-2018, 10:02 AM

## 2018-05-07 NOTE — Progress Notes (Signed)
Assisted Dr. Oddono with left, ultrasound guided, popliteal block. Side rails up, monitors on throughout procedure. See vital signs in flow sheet. Tolerated Procedure well. 

## 2018-05-10 IMAGING — CR DG SHOULDER 2+V*L*
4 series · 4 of 4 positions shown · non-contrast
Comparison: None.

CLINICAL DATA: Left shoulder pain, initial encounter

EXAM:
LEFT SHOULDER - 2+ VIEW

[w shoulder grashey left]
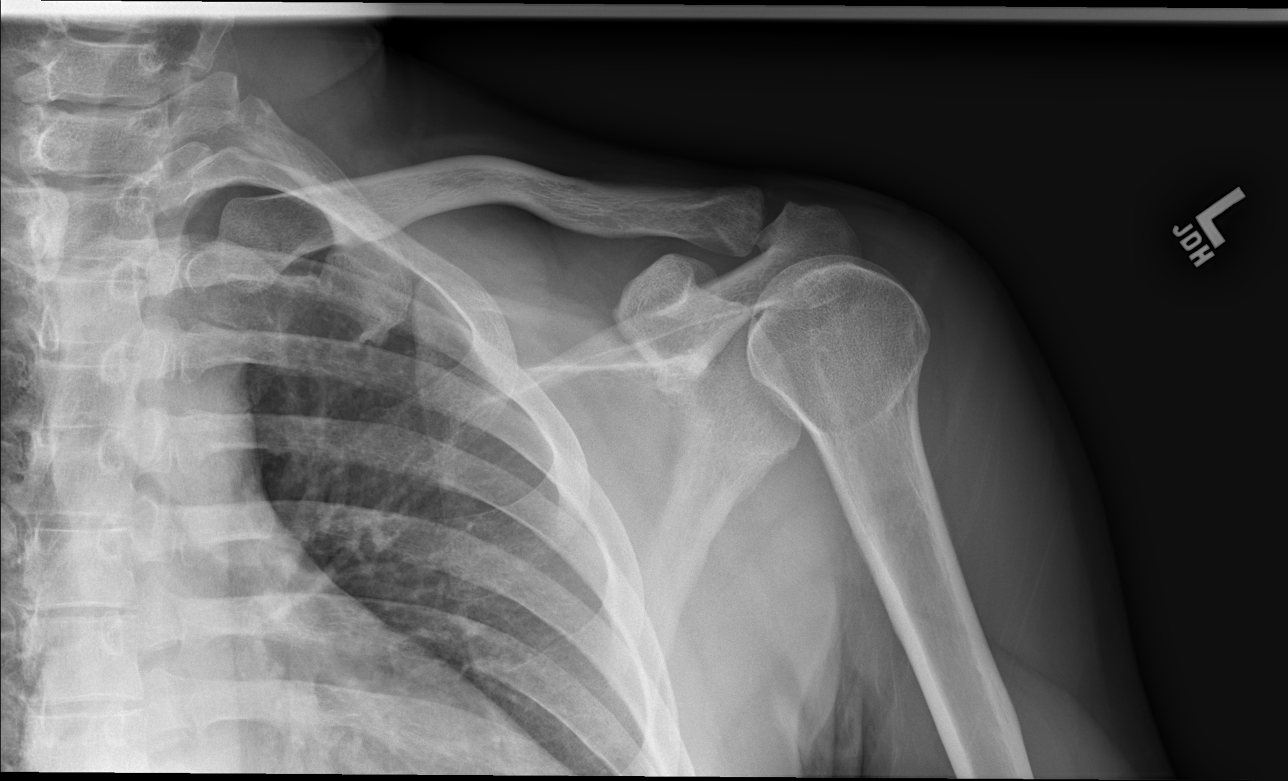

[w shoulder y-view left]
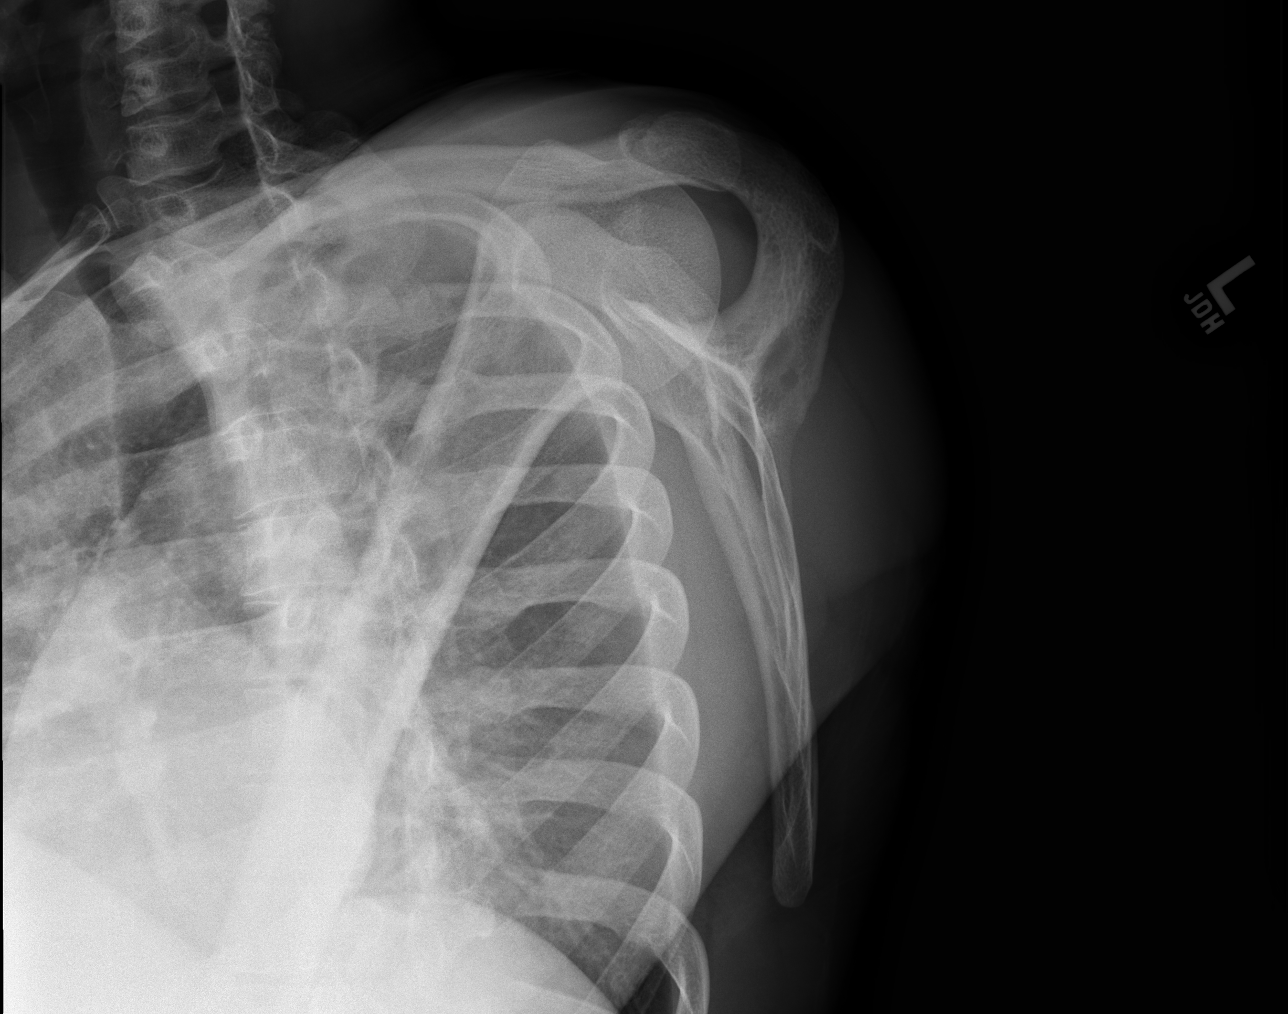

[w shoulder axillary left]
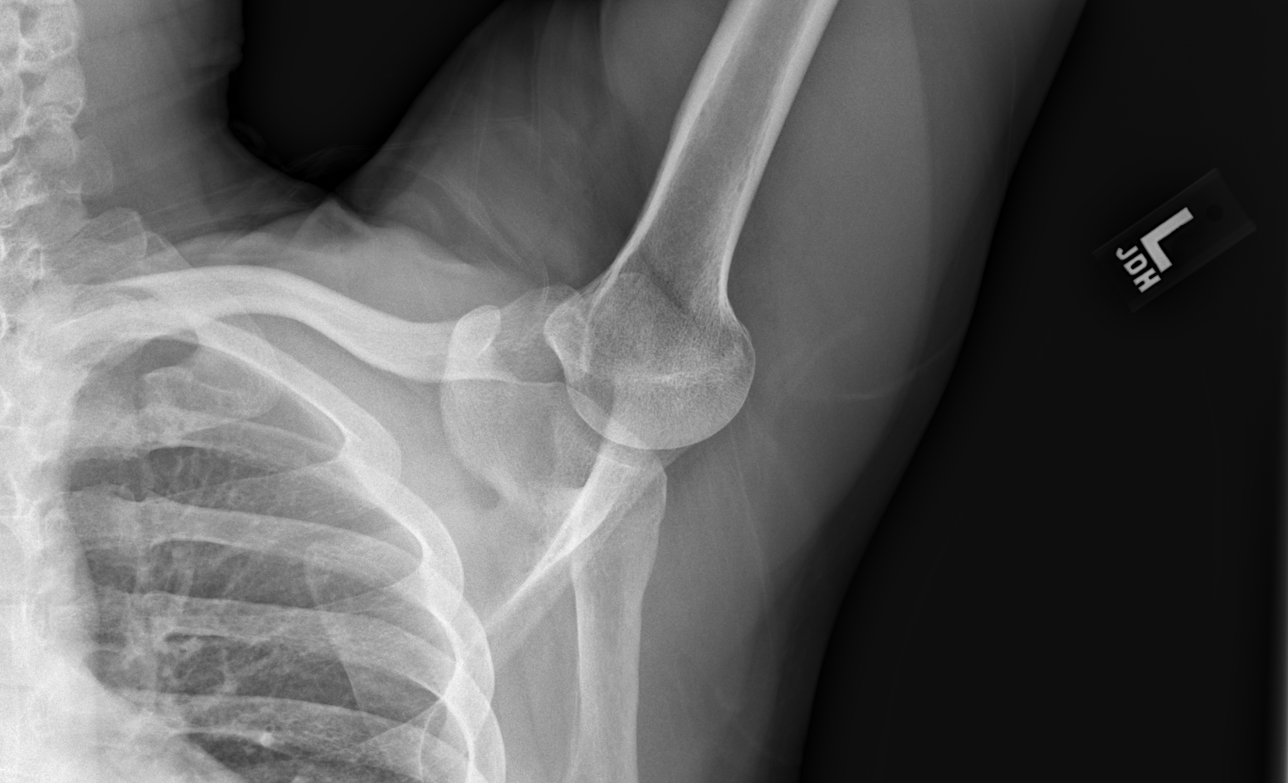

[w shoulder external left]
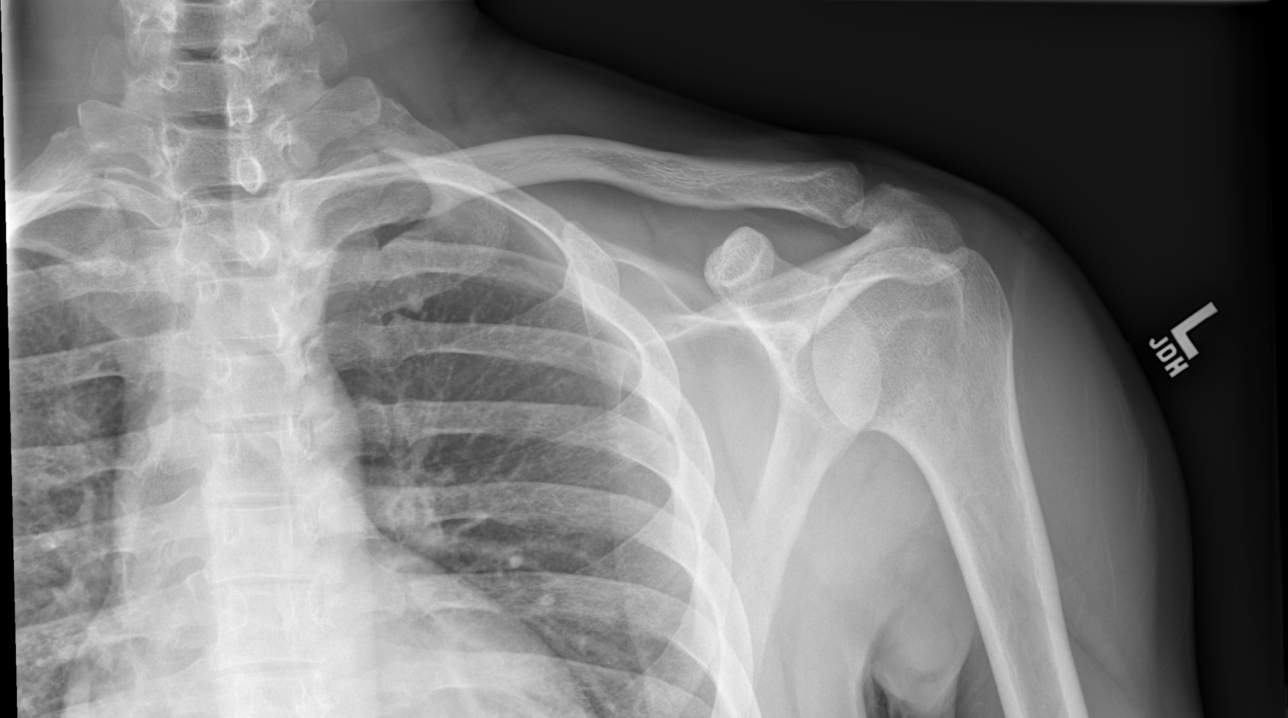

[4 of 4 positions shown; findings below may reference images not displayed]

FINDINGS: Mild degenerative changes of the acromioclavicular joint are seen.
No fracture or dislocation is seen. The underlying bony thorax is
within normal limits.
IMPRESSION: Degenerative change without acute abnormality.

## 2018-08-14 ENCOUNTER — Encounter: Payer: Self-pay | Admitting: Primary Care

## 2018-08-14 ENCOUNTER — Ambulatory Visit: Payer: Managed Care, Other (non HMO) | Admitting: Primary Care

## 2018-08-14 VITALS — BP 120/78 | HR 104 | Temp 98.8°F | Ht 66.0 in | Wt 174.8 lb

## 2018-08-14 DIAGNOSIS — H938X3 Other specified disorders of ear, bilateral: Secondary | ICD-10-CM

## 2018-08-14 NOTE — Patient Instructions (Signed)
Nasal Congestion/Ear Pressure/Sinus Pressure: Try using Flonase (fluticasone) nasal spray. Instill 1 spray in each nostril twice daily.   You can get this over the counter at any drug store.  You can also try an antihistamine such as Claritin, Allegra, or Zyrtec.  It was a pleasure to see you today!

## 2018-08-14 NOTE — Progress Notes (Signed)
Subjective:    Patient ID: Carl Moore, male    DOB: 1981-04-21, 38 y.o.   MRN: [id number]  HPI  Carl Moore is a 38 year old male with a history of seasonal allergies who presents today with a chief complaint of ear fullness.   He denies pain, cough, rhinorrhea, sore throat, fevers. He's tried using peroxide drops without improvement. He doesn't use q-tips.   Review of Systems  Constitutional: Negative for chills and fever.  HENT: Negative for ear pain and sore throat.        Ear fullness  Respiratory: Negative for cough.        Past Medical History:  Diagnosis Date  . Chicken pox      Social History   Socioeconomic History  . Marital status: Married    Spouse name: Not on file  . Number of children: Not on file  . Years of education: Not on file  . Highest education level: Not on file  Occupational History  . Not on file  Social Needs  . Financial resource strain: Not on file  . Food insecurity:    Worry: Not on file    Inability: Not on file  . Transportation needs:    Medical: Not on file    Non-medical: Not on file  Tobacco Use  . Smoking status: Never Smoker  . Smokeless tobacco: Never Used  Substance and Sexual Activity  . Alcohol use: No    Alcohol/week: 0.0 standard drinks  . Drug use: No  . Sexual activity: Yes  Lifestyle  . Physical activity:    Days per week: Not on file    Minutes per session: Not on file  . Stress: Not on file  Relationships  . Social connections:    Talks on phone: Not on file    Gets together: Not on file    Attends religious service: Not on file    Active member of club or organization: Not on file    Attends meetings of clubs or organizations: Not on file    Relationship status: Not on file  . Intimate partner violence:    Fear of current or ex partner: Not on file    Emotionally abused: Not on file    Physically abused: Not on file    Forced sexual activity: Not on file  Other Topics Concern  . Not on  file  Social History Narrative   Works as Development worker, international [employer].   Married. Newly wed.   No children.   Enjoys playing golf.    Past Surgical History:  Procedure Laterality Date  . KNEE ARTHROSCOPY Left   . ULNAR NERVE REPAIR Left     Family History  Problem Relation Age of Onset  . Hypertension Mother   . Stroke Father        Deceased  . Hypertension Father     No Known Allergies  Current Outpatient Medications on File Prior to Visit  Medication Sig Dispense Refill  . cetirizine (ZYRTEC) 10 MG tablet Take 10 mg by mouth as needed for allergies.     . cyclobenzaprine (FLEXERIL) 10 MG tablet Take 10 mg by mouth as needed.    . NONFORMULARY OR COMPOUNDED ITEM Apply 1 application topically as needed (pain).     No current facility-administered medications on file prior to visit.     BP 120/78   Pulse (!) 104   Temp 98.8 F (37.1 C) (Oral)   Ht 5\' 6"  (1.676 m)  Wt 174 lb 12 oz (79.3 kg)   SpO2 99%   BMI 28.21 kg/m    Objective:   Physical Exam  Constitutional: He appears well-nourished. He does not appear ill.  HENT:  Right Ear: Tympanic membrane and ear canal normal.  Left Ear: Tympanic membrane and ear canal normal.  Nose: No mucosal edema. Right sinus exhibits no maxillary sinus tenderness and no frontal sinus tenderness. Left sinus exhibits no maxillary sinus tenderness and no frontal sinus tenderness.  Mouth/Throat: Oropharynx is clear and moist.  Cloudy appearance to left TM  Neck: Neck supple.  Cardiovascular: Normal rate and regular rhythm.  Respiratory: Effort normal and breath sounds normal. He has no wheezes.  Skin: Skin is warm and dry.           Assessment & Plan:  Ear Fullness:  Present for the last week, no improvement with Peroxide drops. Exam today without cerumen impaction, cloudy left TM. Exam otherwise unremarkable.  Will have him try Flonase for potential ear effusion, also discussed use of antihistamine.   Pleas [name], NP

## 2018-08-27 ENCOUNTER — Ambulatory Visit: Payer: Managed Care, Other (non HMO) | Admitting: Family Medicine

## 2018-08-27 ENCOUNTER — Encounter: Payer: Self-pay | Admitting: Family Medicine

## 2018-08-27 VITALS — BP 152/84 | HR 116 | Temp 98.8°F | Ht 66.0 in | Wt 175.5 lb

## 2018-08-27 DIAGNOSIS — S93402A Sprain of unspecified ligament of left ankle, initial encounter: Secondary | ICD-10-CM

## 2018-08-27 DIAGNOSIS — R030 Elevated blood-pressure reading, without diagnosis of hypertension: Secondary | ICD-10-CM | POA: Insufficient documentation

## 2018-08-27 NOTE — Assessment & Plan Note (Signed)
Pt will do home monitoring. Strict return precautions if symptoms persist. Recommend f/u if continuing to be elevated at home.

## 2018-08-27 NOTE — Progress Notes (Signed)
Subjective:     Carl Moore is a 38 y.o. male presenting for Ankle Pain (Left. Tuesday-28/2020 went to [employer] game and when he stepped down fro mthe bleachers he stepped wrong and rolled his left ankle. He has applied iced off and on. Swelling has improved and feels better today overall but wants to have this looked at. )     Ankle Pain   The incident occurred 2 days ago. The incident occurred in the street. The injury mechanism was an inversion injury. The pain is present in the left ankle. The pain is at a severity of 0/10. The patient is experiencing no pain. The pain has been improving since onset. He reports no foreign bodies present. Nothing aggravates the symptoms. He has tried ice and NSAIDs for the symptoms. The treatment provided significant relief.   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  Respiratory: Negative for chest tightness and shortness of breath.   Cardiovascular: Negative for chest pain and leg swelling.  Neurological: Negative for weakness.     Social History   Tobacco Use  Smoking Status Never Smoker  Smokeless Tobacco Never Used        Objective:    BP Readings from Last 3 Encounters:  08/27/18 (!) 152/84  08/14/18 120/78  05/07/18 117/76   Wt Readings from Last 3 Encounters:  08/27/18 175 lb 8 oz (79.6 kg)  08/14/18 174 lb 12 oz (79.3 kg)  05/07/18 157 lb 13.6 oz (71.6 kg)    BP (!) 152/84   Pulse (!) 116   Temp 98.8 F (37.1 C)   Ht 5\' 6"  (1.676 m) Comment: per patient  Wt 175 lb 8 oz (79.6 kg)   SpO2 99%   BMI 28.33 kg/m    Physical Exam Constitutional:    Appearance: Normal appearance. He is not ill-appearing or diaphoretic.  HENT:     Right Ear: External ear normal.     Left Ear: External ear normal.     Nose: Nose normal.  Eyes:     General: No scleral icterus.    Extraocular Movements: Extraocular movements intact.     Conjunctiva/sclera: Conjunctivae normal.  Neck:     Musculoskeletal: Neck supple.  Cardiovascular:     Rate and Rhythm: Regular rhythm. Tachycardia present.     Heart sounds: No murmur.  Pulmonary:     Effort: Pulmonary effort is normal. No respiratory distress.     Breath sounds: Normal breath sounds. No wheezing or rales.  Musculoskeletal:     Comments: Left ankle: Inspection: no swelling or erythema, Large healed scar on the first metatarsal area Palpation: no TTP along the ankle, foot, or calf ROM: normal full ROM w/o pain Strength: normal strength w/o pain Normal gait, able to stand on injured foot w/o pain No laxity  Skin:    General: Skin is warm and dry.  Neurological:     Mental Status: He is alert. Mental status is at baseline.  Psychiatric:        Mood and Affect: Mood normal.  Assessment & Plan:   Problem List Items Addressed This Visit      Other   Elevated blood pressure reading    Pt will do home monitoring. Strict return precautions if symptoms persist. Recommend f/u if continuing to be elevated at home.        Other Visit Diagnoses    Sprain of left ankle, unspecified ligament, initial encounter    -  Primary     Ankle sprain seems fully recovered. Paperwork to return to work completed for patient.      Return in about 4 weeks (around 09/24/2018).  Lesleigh Noe, MD

## 2018-08-27 NOTE — Patient Instructions (Addendum)
Improving well    #elevated blood pressure - check your blood pressure in the pharmacy or at home - Goal is <140/90 - Make a follow-up appointment in 4 weeks which you cancel if your blood pressure is normal at home - just send Anda Kraft a message

## 2018-09-22 ENCOUNTER — Telehealth: Payer: Self-pay | Admitting: Primary Care

## 2018-09-22 DIAGNOSIS — Z20828 Contact with and (suspected) exposure to other viral communicable diseases: Secondary | ICD-10-CM

## 2018-09-22 MED ORDER — OSELTAMIVIR PHOSPHATE 75 MG PO CAPS: 75.0000 mg | ORAL_CAPSULE | Freq: Every day | ORAL | 0 refills | Status: DC

## 2018-09-22 NOTE — Telephone Encounter (Signed)
Spoken and notified patient of Kate Clark's comments. Patient verbalized understanding.  

## 2018-09-22 NOTE — Telephone Encounter (Signed)
Please notify patient that I sent Tamiflu to [facility]. Take 1 capsule daily x 10 days for flu prevention.

## 2018-09-22 NOTE — Telephone Encounter (Signed)
Pt's wife was diagnosed with the flu and something for preventative    Sent to [facility]/[address]

## 2018-11-05 ENCOUNTER — Encounter (HOSPITAL_COMMUNITY): Payer: Self-pay | Admitting: Emergency Medicine

## 2018-11-05 ENCOUNTER — Emergency Department (HOSPITAL_COMMUNITY): Payer: Managed Care, Other (non HMO)

## 2018-11-05 ENCOUNTER — Emergency Department (HOSPITAL_COMMUNITY)
Admission: EM | Admit: 2018-11-05 | Discharge: 2018-11-05 | Disposition: A | Discharge status: Home / Self Care | Payer: Managed Care, Other (non HMO) | Attending: Emergency Medicine | Admitting: Emergency Medicine

## 2018-11-05 ENCOUNTER — Other Ambulatory Visit: Payer: Self-pay

## 2018-11-05 DIAGNOSIS — R1031 Right lower quadrant pain: Secondary | ICD-10-CM | POA: Diagnosis present

## 2018-11-05 DIAGNOSIS — R109 Unspecified abdominal pain: Secondary | ICD-10-CM

## 2018-11-05 LAB — COMPREHENSIVE METABOLIC PANEL
ALT: 31 U/L (ref 0–44)
AST: 27 U/L (ref 15–41)
Albumin: 4.3 g/dL (ref 3.5–5.0)
Alkaline Phosphatase: 88 U/L (ref 38–126)
Anion gap: 9 (ref 5–15)
BUN: 15 mg/dL (ref 6–20)
CO2: 23 mmol/L (ref 22–32)
Calcium: 8.7 mg/dL — ABNORMAL LOW (ref 8.9–10.3)
Chloride: 105 mmol/L (ref 98–111)
Creatinine, Ser: 1.16 mg/dL (ref 0.61–1.24)
GFR calc Af Amer: >60 mL/min (ref >60)
GFR calc non Af Amer: >60 mL/min (ref >60)
Glucose, Bld: 110 mg/dL — ABNORMAL HIGH (ref 70–99)
Potassium: 3.9 mmol/L (ref 3.5–5.1)
Sodium: 137 mmol/L (ref 135–145)
Total Bilirubin: 0.4 mg/dL (ref 0.3–1.2)
Total Protein: 7.4 g/dL (ref 6.5–8.1)

## 2018-11-05 LAB — CBC
HCT: 49.6 % (ref 39.0–52.0)
Hemoglobin: 17 g/dL (ref 13.0–17.0)
MCH: 28.8 pg (ref 26.0–34.0)
MCHC: 34.3 g/dL (ref 30.0–36.0)
MCV: 83.9 fL (ref 80.0–100.0)
Platelets: 399 10*3/uL (ref 150–400)
RBC: 5.91 MIL/uL — ABNORMAL HIGH (ref 4.22–5.81)
RDW: 12.5 % (ref 11.5–15.5)
WBC: 10.1 10*3/uL (ref 4.0–10.5)
nRBC: 0 % (ref 0.0–0.2)

## 2018-11-05 LAB — URINALYSIS, ROUTINE W REFLEX MICROSCOPIC
Bilirubin Urine: NEGATIVE
Glucose, UA: NEGATIVE mg/dL
Hgb urine dipstick: NEGATIVE
Ketones, ur: NEGATIVE mg/dL
Leukocytes,Ua: NEGATIVE
Nitrite: NEGATIVE
Protein, ur: NEGATIVE mg/dL
Specific Gravity, Urine: 1.005 (ref 1.005–1.030)
pH: 7 (ref 5.0–8.0)

## 2018-11-05 LAB — LIPASE, BLOOD: Lipase: 32 U/L (ref 11–51)

## 2018-11-05 MED ORDER — IBUPROFEN 800 MG PO TABS: 800.0000 mg | ORAL_TABLET | Freq: Three times a day (TID) | ORAL | 0 refills | Status: DC | PRN

## 2018-11-05 MED ORDER — MORPHINE SULFATE (PF) 4 MG/ML IV SOLN
4.0000 mg | Freq: Once | INTRAVENOUS | Status: AC
  Administered 2018-11-05: 4 mg via INTRAVENOUS
  Filled 2018-11-05: qty 1

## 2018-11-05 MED ORDER — IOHEXOL 300 MG/ML  SOLN
100.0000 mL | Freq: Once | INTRAMUSCULAR | Status: AC | PRN
  Administered 2018-11-05: 100 mL via INTRAVENOUS

## 2018-11-05 MED ORDER — SODIUM CHLORIDE 0.9 % IV BOLUS
500.0000 mL | Freq: Once | INTRAVENOUS | Status: AC
  Administered 2018-11-05: 500 mL via INTRAVENOUS

## 2018-11-05 MED ORDER — SODIUM CHLORIDE (PF) 0.9 % IJ SOLN
INTRAMUSCULAR | Status: AC
  Filled 2018-11-05: qty 50

## 2018-11-05 MED ORDER — ONDANSETRON HCL 4 MG/2ML IJ SOLN
4.0000 mg | Freq: Once | INTRAMUSCULAR | Status: AC
  Administered 2018-11-05: 4 mg via INTRAVENOUS

## 2018-11-05 MED ORDER — KETOROLAC TROMETHAMINE 30 MG/ML IJ SOLN
30.0000 mg | Freq: Once | INTRAMUSCULAR | Status: AC
  Administered 2018-11-05: 30 mg via INTRAVENOUS
  Filled 2018-11-05: qty 1

## 2018-11-05 MED ORDER — ONDANSETRON 4 MG PO TBDP: 4.0000 mg | ORAL_TABLET | Freq: Three times a day (TID) | ORAL | 0 refills | Status: DC | PRN

## 2018-11-05 NOTE — ED Triage Notes (Signed)
Pt c/o right sided abd pains with nausea for couple hours.

## 2018-11-05 NOTE — ED Notes (Signed)
Pt to bathroom. Urine collected. NAD

## 2018-11-05 NOTE — ED Notes (Signed)
Patient transported to CT 

## 2018-11-05 NOTE — ED Notes (Signed)
Pt aware of urine need

## 2018-11-05 NOTE — ED Notes (Signed)
Lab called about lab results. Was told the machine shut down and they are running it as fast as they can on the other one.

## 2018-11-05 NOTE — Discharge Instructions (Signed)
It was my pleasure taking care of you today!   Zofran as needed for nausea. Alternate between Tylenol and Ibuprofen as needed for pain.   Fortunately, your lab work and imaging was very reassuring today.   Follow up with your primary care doctor or the GI specialist for further discussion of your abdominal pain.   It is VERY important that you monitor your symptoms and return to the Emergency Department if you develop any of the following symptoms:  You have a fever.  You throw up and can't keep fluids down. You pass bloody or black tarry stools.  There is bright red blood in the stool. New or worsening symptoms develop.  You have any questions or concerns.

## 2018-11-05 NOTE — ED Notes (Signed)
Pt given and verbalized understanding of d/c instructions and need for follow up with pcp. Told to return if s/s worsen. No further distress or questions upon ambulation out of department.  

## 2018-11-05 NOTE — ED Provider Notes (Signed)
[facility] DEPT Provider Note   CSN: [id number] Arrival date & time: 11/05/18  1845    History   Chief Complaint Chief Complaint  Patient presents with   Abdominal Pain    HPI Carl Moore is a 38 y.o. male.     The history is provided by the patient and medical records. No language interpreter was used.  Abdominal Pain  Associated symptoms: nausea   Associated symptoms: no constipation, no diarrhea and no vomiting    Carl Moore is a 38 y.o. male  with a PMH as listed below who presents to the Emergency Department complaining of right lower abdominal pain which began today. Pain initially was coming-and-going, but has been constant now since 5 PM.  Associated with nausea, but no vomiting.  He tried taking Tylenol around 530 this afternoon which did not help very much.  He denies any fevers.  No diarrhea, patient no blood in the stool.  He does report history of similar sometime last year, but today's symptoms seem much more severe.  He did come to the emergency department then.  He reports having CT scan and labs and being told everything looked fine.  Chart review consistent with this as well.  Past Medical History:  Diagnosis Date   Chicken pox     Patient Active Problem List   Diagnosis Date Noted   Elevated blood pressure reading 08/27/2018   Hypertriglyceridemia 05/10/2016   Preventative health care 03/24/2015   Seasonal allergies 12/19/2014    Past Surgical History:  Procedure Laterality Date   KNEE ARTHROSCOPY Left    ULNAR NERVE REPAIR Left         Home Medications    Prior to Admission medications   Medication Sig Start Date End Date Taking? Authorizing Provider  acetaminophen (TYLENOL) 500 MG tablet Take 1,000 mg by mouth every 6 (six) hours as needed for moderate pain.   Yes [provider]  cetirizine (ZYRTEC) 10 MG tablet Take 10 mg by mouth as needed for allergies.    Yes [provider]  cyclobenzaprine (FLEXERIL) 10 MG tablet Take 10 mg by mouth as needed. 12/12/17  Yes [provider]  ibuprofen (ADVIL,MOTRIN) 800 MG tablet Take 1 tablet (800 mg total) by mouth every 8 (eight) hours as needed for mild pain or moderate pain. 11/05/18   Galadriel Shroff, Ozella Almond, PA-C  ondansetron (ZOFRAN ODT) 4 MG disintegrating tablet Take 1 tablet (4 mg total) by mouth every 8 (eight) hours as needed for nausea or vomiting. 11/05/18   Mackinzee Roszak, Ozella Almond, PA-C  oseltamivir (TAMIFLU) 75 MG capsule Take 1 capsule (75 mg total) by mouth daily. Patient not taking: Reported on 11/05/2018 09/22/18   Pleas [name], NP    Family History Family History  Problem Relation Age of Onset   Hypertension Mother    Stroke Father        Deceased   Hypertension Father     Social History Social History   Tobacco Use   Smoking status: Never Smoker   Smokeless tobacco: Never Used  Substance Use Topics   Alcohol use: No    Alcohol/week: 0.0 standard drinks   Drug use: No     Allergies   Patient has no known allergies.   Review of Systems Review of Systems  Gastrointestinal: Positive for abdominal pain and nausea. Negative for blood in stool, constipation, diarrhea and vomiting.  All other systems reviewed and are negative.    Physical  Exam Updated Vital Signs BP 132/87 (BP Location: Left Arm)    Pulse 85    Temp 98.8 F (37.1 C) (Oral)    Resp 16    Ht 5\' 6"  (1.676 m)    Wt 79.4 kg    SpO2 95%    BMI 28.25 kg/m   Physical Exam Vitals signs and nursing note reviewed.  Constitutional:      General: He is not in acute distress.    Appearance: He is well-developed.  HENT:     Head: Normocephalic and atraumatic.  Neck:     Musculoskeletal: Neck supple.  Cardiovascular:     Rate and Rhythm: Normal rate and regular rhythm.     Heart sounds: Normal heart sounds. No murmur.  Pulmonary:     Effort: Pulmonary effort is normal. No respiratory distress.     Breath  sounds: Normal breath sounds.  Abdominal:     General: There is no distension.     Palpations: Abdomen is soft.     Comments: Tenderness to palpation of the RLQ. No flank or CVA tenderness.  Skin:    General: Skin is warm and dry.  Neurological:     Mental Status: He is alert and oriented to person, place, and time.      ED Treatments / Results  Labs (all labs ordered are listed, but only abnormal results are displayed) Labs Reviewed  COMPREHENSIVE METABOLIC PANEL - Abnormal; Notable for the following components:      Result Value   Glucose, Bld 110 (*)    Calcium 8.7 (*)    All other components within normal limits  CBC - Abnormal; Notable for the following components:   RBC 5.91 (*)    All other components within normal limits  URINALYSIS, ROUTINE W REFLEX MICROSCOPIC - Abnormal; Notable for the following components:   Color, Urine STRAW (*)    All other components within normal limits  LIPASE, BLOOD    EKG None  Radiology Ct Abdomen Pelvis W Contrast  Result Date: 11/05/2018 CLINICAL DATA:  Nausea and right-sided abdominal pain EXAM: CT ABDOMEN AND PELVIS WITH CONTRAST TECHNIQUE: Multidetector CT imaging of the abdomen and pelvis was performed using the standard protocol following bolus administration of intravenous contrast. CONTRAST:  11mL OMNIPAQUE IOHEXOL 300 MG/ML  SOLN COMPARISON:  CT abdomen pelvis 02/15/2018 FINDINGS: LOWER CHEST: There is no basilar pleural or apical pericardial effusion. HEPATOBILIARY: The hepatic contours and density are normal. There is no intra- or extrahepatic biliary dilatation. There is cholelithiasis without acute inflammation. PANCREAS: The pancreatic parenchymal contours are normal and there is no ductal dilatation. There is no peripancreatic fluid collection. SPLEEN: Normal. ADRENALS/URINARY TRACT: --Adrenal glands: Normal. --Right kidney/ureter: Single nonobstructing renal calculus measuring 3 mm. No hydronephrosis, perinephric stranding  or solid renal mass. --Left kidney/ureter: No hydronephrosis, nephroureterolithiasis, perinephric stranding or solid renal mass. --Urinary bladder: Normal for degree of distention STOMACH/BOWEL: --Stomach/Duodenum: There is no hiatal hernia or other gastric abnormality. The duodenal course and caliber are normal. --Small bowel: No dilatation or inflammation. --Colon: No focal abnormality. --Appendix: Normal. VASCULAR/LYMPHATIC: Normal course and caliber of the major abdominal vessels. No abdominal or pelvic lymphadenopathy. REPRODUCTIVE: Normal prostate size with symmetric seminal vesicles. MUSCULOSKELETAL. There is grade 1 anterolisthesis at L5-S1 secondary to bilateral L5 pars interarticularis defects. OTHER: None. IMPRESSION: 1. No acute abdominopelvic abnormality. 2. Cholelithiasis without acute inflammation. 3. Nonobstructive right nephrolithiasis measuring 3 mm. 4. Grade 1 anterolisthesis at L5-S1 secondary to bilateral L5 pars interarticularis defects. Electronically Signed  By: Ulyses Jarred M.D.   On: 11/05/2018 21:55    Procedures Procedures (including critical care time)  Medications Ordered in ED Medications  sodium chloride (PF) 0.9 % injection (has no administration in time range)  ketorolac (TORADOL) 30 MG/ML injection 30 mg (has no administration in time range)  ondansetron (ZOFRAN) injection 4 mg (4 mg Intravenous Given 11/05/18 2002)  morphine 4 MG/ML injection 4 mg (4 mg Intravenous Given 11/05/18 2002)  sodium chloride 0.9 % bolus 500 mL (0 mLs Intravenous Stopped 11/05/18 2040)  iohexol (OMNIPAQUE) 300 MG/ML solution 100 mL (100 mLs Intravenous Contrast Given 11/05/18 2127)     Initial Impression / Assessment and Plan / ED Course  I have reviewed the triage vital signs and the nursing notes.  Pertinent labs & imaging results that were available during my care of the patient were reviewed by me and considered in my medical decision making (see chart for details).       Carl Moore is a 38 y.o. male who presents to ED for right lower quadrant abdominal pain which began today.  On exam, patient is afebrile, hemodynamically stable with equal tenderness to the right lower quadrant.  We will proceed with labs, urine and CT scan to assess for appendicitis / kidney stone.   UA without signs of infection or blood.  Labs reassuring. White count of 10.1 CT with no acute findings.   Patient re-evaluated. Pain is improved. Tolerating PO. No peritoneal signs on repeat abdominal exam. Discharged home with symptomatic care & recommendations to follow up with PCP or GI. Return precautions discussed and all questions answered.   Final Clinical Impressions(s) / ED Diagnoses   Final diagnoses:  Right sided abdominal pain    ED Discharge Orders         Ordered    ondansetron (ZOFRAN ODT) 4 MG disintegrating tablet  Every 8 hours PRN     11/05/18 2205    ibuprofen (ADVIL,MOTRIN) 800 MG tablet  Every 8 hours PRN     11/05/18 2205           Odies Desa, Ozella Almond, PA-C 11/05/18 [place], DO 11/09/18 1504

## 2018-11-09 ENCOUNTER — Ambulatory Visit (INDEPENDENT_AMBULATORY_CARE_PROVIDER_SITE_OTHER): Payer: Managed Care, Other (non HMO) | Admitting: Primary Care

## 2018-11-09 ENCOUNTER — Encounter: Payer: Self-pay | Admitting: Primary Care

## 2018-11-09 DIAGNOSIS — R1031 Right lower quadrant pain: Secondary | ICD-10-CM

## 2018-11-09 DIAGNOSIS — R109 Unspecified abdominal pain: Secondary | ICD-10-CM | POA: Insufficient documentation

## 2018-11-09 NOTE — Patient Instructions (Signed)
You will be contacted regarding your referral to general surgery .  Please let us know if you have not been contacted within 2 days.  Continue the ondansetron as needed for nausea.  Please go back to the hospital if you develop fevers, diarrhea, bloody stools.  It was a pleasure to see you today! [name], NP-C

## 2018-11-09 NOTE — Addendum Note (Signed)
Addended by: Pleas [name] on: 11/09/2018 12:16 PM   Modules accepted: Orders

## 2018-11-09 NOTE — Assessment & Plan Note (Addendum)
Constant right lower abdominal pain x 4 days. ED visit with normal labs, CT without acute appendicitis. Symptoms could be secondary to cholelithiasis, but recent CT scan without evidence of inflammation.  Given his constant pain, history of this pain in the past that feels identical, will send to general surgery for further evaluation. Referral placed. Continue Zofran PRN. Return precautions provided.  Hospital notes, labs, imaging reviewed.

## 2018-11-09 NOTE — Progress Notes (Signed)
Subjective:    Patient ID: Carl Moore, male    DOB: 1980-11-06, 38 y.o.   MRN: [id number]  HPI  Virtual Visit via Video Note  I connected with [name] on 11/09/18 at 11:40 AM EDT by a video enabled telemedicine application and verified that I am speaking with the correct person using two identifiers.   I discussed the limitations of evaluation and management by telemedicine and the availability of in person appointments. The patient expressed understanding and agreed to proceed. He is at home, I am in the office.  We attempted video but the video failed. We could not establish a clear connection so the rest of our visit was completed via phone.   History of Present Illness:  Carl Moore is a 38 year old male who presents today via video for emergency department follow up.  He presented to [facility] ED on 11/05/18 with a chief complaint of abdominal pain. His pain was located to the right lower abdomen which began earlier that day intermittently but progressed through the evening. This prompted his evaluation. He endorsed a history of this occurring in the past.   During his stay in the ED he underwent labs including UA (negative), CBC/Lipase/CMP (negative), and CT abdomen/pelvis which showed cholelithiasis without inflammation, non-obstructive right nephrolithiasis measuring 3 mm, chronic L5-S1 anterolisthesis, grade 1. No acute appendicitis. He was feeling better and tolerating oral liquids so he was discharged home with a prescription for Zofran.   Since his ED visit he continues to experience his pain which is located to the right lower abdomen. His pain is constant. He follows with [facility] in [place], recent visit being 2 days ago. Urology believes his pain to be secondary to cholelithiasis. He denies vomiting, diarrhea, fevers. He is taking Zofran as needed for nausea. He is eating and drinking well overall, pain doesn't seem to correlate with meals.     Observations/Objective:  Alert and oriented. Speaking in complete sentences.  Assessment and Plan:  Constant right lower abdominal pain x 4 days. ED visit with normal labs, CT without acute appendicitis. Symptoms could be secondary to cholelithiasis, but recent CT scan without evidence of inflammation.  Given his constant pain, history of this pain in the past that feels identical, will send to general surgery for further evaluation. Referral placed. Continue Zofran PRN. Return precautions provided.  Hospital notes, labs imaging reviewed.   Follow Up Instructions:  You will be contacted regarding your referral to general surgery .  Please let us know if you have not been contacted within 2 days.  Continue the ondansetron as needed for nausea.  Please go back to the hospital if you develop fevers, diarrhea, bloody stools.  It was a pleasure to see you today! [name], NP-C    I discussed the assessment and treatment plan with the patient. The patient was provided an opportunity to ask questions and all were answered. The patient agreed with the plan and demonstrated an understanding of the instructions.   The patient was advised to call back or seek an in-person evaluation if the symptoms worsen or if the condition fails to improve as anticipated.     Pleas [name], NP    Review of Systems  Constitutional: Negative for fever.  Respiratory: Negative for shortness of breath.   Cardiovascular: Negative for chest pain.  Gastrointestinal: Positive for abdominal pain and nausea. Negative for blood in stool, constipation, diarrhea and vomiting.       Past  Medical History:  Diagnosis Date  . Chicken pox      Social History   Socioeconomic History  . Marital status: Married    Spouse name: Not on file  . Number of children: Not on file  . Years of education: Not on file  . Highest education level: Not on file  Occupational History  . Not on file  Social  Needs  . Financial resource strain: Not on file  . Food insecurity:    Worry: Not on file    Inability: Not on file  . Transportation needs:    Medical: Not on file    Non-medical: Not on file  Tobacco Use  . Smoking status: Never Smoker  . Smokeless tobacco: Never Used  Substance and Sexual Activity  . Alcohol use: No    Alcohol/week: 0.0 standard drinks  . Drug use: No  . Sexual activity: Yes  Lifestyle  . Physical activity:    Days per week: Not on file    Minutes per session: Not on file  . Stress: Not on file  Relationships  . Social connections:    Talks on phone: Not on file    Gets together: Not on file    Attends religious service: Not on file    Active member of club or organization: Not on file    Attends meetings of clubs or organizations: Not on file    Relationship status: Not on file  . Intimate partner violence:    Fear of current or ex partner: Not on file    Emotionally abused: Not on file    Physically abused: Not on file    Forced sexual activity: Not on file  Other Topics Concern  . 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   No children.   Enjoys playing golf.    Past Surgical History:  Procedure Laterality Date  . KNEE ARTHROSCOPY Left   . ULNAR NERVE REPAIR Left     Family History  Problem Relation Age of Onset  . Hypertension Mother   . Stroke Father        Deceased  . Hypertension Father     No Known Allergies  Current Outpatient Medications on File Prior to Visit  Medication Sig Dispense Refill  . acetaminophen (TYLENOL) 500 MG tablet Take 1,000 mg by mouth every 6 (six) hours as needed for moderate pain.    . cetirizine (ZYRTEC) 10 MG tablet Take 10 mg by mouth as needed for allergies.     . cyclobenzaprine (FLEXERIL) 10 MG tablet Take 10 mg by mouth as needed.    Marland Kitchen ibuprofen (ADVIL,MOTRIN) 800 MG tablet Take 1 tablet (800 mg total) by mouth every 8 (eight) hours as needed for mild  pain or moderate pain. 21 tablet 0  . ondansetron (ZOFRAN ODT) 4 MG disintegrating tablet Take 1 tablet (4 mg total) by mouth every 8 (eight) hours as needed for nausea or vomiting. (Patient not taking: Reported on 11/09/2018) 20 tablet 0   No current facility-administered medications on file prior to visit.     There were no vitals taken for this visit.   Objective:   Physical Exam  Constitutional: He is oriented to person, place, and time.  Respiratory: Effort normal.  Neurological: He is alert and oriented to person, place, and time.  Psychiatric: He has a normal mood and affect.           Assessment &  Plan:

## 2018-11-10 ENCOUNTER — Other Ambulatory Visit: Payer: Self-pay

## 2018-11-10 ENCOUNTER — Ambulatory Visit (INDEPENDENT_AMBULATORY_CARE_PROVIDER_SITE_OTHER): Payer: Managed Care, Other (non HMO) | Admitting: General Surgery

## 2018-11-10 ENCOUNTER — Encounter: Payer: Self-pay | Admitting: General Surgery

## 2018-11-10 VITALS — BP 155/90 | HR 101 | Temp 97.7°F | Resp 15 | Ht 66.0 in | Wt 166.0 lb

## 2018-11-10 DIAGNOSIS — R1031 Right lower quadrant pain: Secondary | ICD-10-CM | POA: Diagnosis not present

## 2018-11-10 NOTE — Patient Instructions (Addendum)
Follow up as needed.   May use heat as needed for comfort  Call if no improvement or worsening symptoms

## 2018-11-10 NOTE — Progress Notes (Signed)
Patient ID: Carl Moore, male   DOB: 06-28-1981, 38 y.o.   MRN: [id number]  Chief Complaint  Patient presents with  . Abdominal Pain    HPI Carl Moore is a 38 y.o. male here today for a evaluation of right lower quadrant pain. He was seen in the ED on 11/05/2018., CT scan done.  Associated with nausea that comes and goes, but no vomiting. This started on Thursday(11/05/18) around 3 pm with very intense pain. He reports that the pain is still present about a 6.5 in intensity. He reports it is in the lower right abdomen. The pain does not travel.  He had the same problem last July but the CT did not show stones.   The patient reports in the time between the July 2019 episode and the episode beginning earlier this month he had no dietary intolerance.  No weight loss, night sweats, fevers.  The episode of pain that occurred earlier this month occurred while he was under a house doing an inspection for termites.  He was able to demonstrate the position he was in and it was somewhat of a contortion.  HPI  Past Medical History:  Diagnosis Date  . Chicken pox     Past Surgical History:  Procedure Laterality Date  . bone spur Left 02/2018   left big toe  . KNEE ARTHROSCOPY Left   . ULNAR NERVE REPAIR Left 1999    Family History  Problem Relation Age of Onset  . Hypertension Mother   . Stroke Father        Deceased  . Hypertension Father     Social History Social History   Tobacco Use  . Smoking status: Never Smoker  . Smokeless tobacco: Never Used  Substance Use Topics  . Alcohol use: No    Alcohol/week: 0.0 standard drinks  . Drug use: No    No Known Allergies  Current Outpatient Medications  Medication Sig Dispense Refill  . acetaminophen (TYLENOL) 500 MG tablet Take 1,000 mg by mouth every 6 (six) hours as needed for moderate pain.    . cetirizine (ZYRTEC) 10 MG tablet Take 10 mg by mouth as needed for allergies.     . cyclobenzaprine (FLEXERIL) 10 MG  tablet Take 10 mg by mouth as needed.    Marland Kitchen ibuprofen (ADVIL,MOTRIN) 800 MG tablet Take 1 tablet (800 mg total) by mouth every 8 (eight) hours as needed for mild pain or moderate pain. 21 tablet 0  . ondansetron (ZOFRAN ODT) 4 MG disintegrating tablet Take 1 tablet (4 mg total) by mouth every 8 (eight) hours as needed for nausea or vomiting. 20 tablet 0   No current facility-administered medications for this visit.     Review of Systems Review of Systems  Constitutional: Negative.   HENT: Negative.   Eyes: Negative.   Respiratory: Negative.   Cardiovascular: Negative.   Gastrointestinal: Positive for abdominal pain and nausea. Negative for abdominal distention, anal bleeding, blood in stool, constipation, diarrhea, rectal pain and vomiting.  Endocrine: Negative.   Genitourinary: Negative.   Musculoskeletal: Negative.   Allergic/Immunologic: Negative.   Neurological: Negative.   Hematological: Negative.   Psychiatric/Behavioral: Negative.     Blood pressure (!) 155/90, pulse (!) 101, temperature 97.7 F (36.5 C), resp. rate 15, height 5\' 6"  (1.676 m), weight 166 lb (75.3 kg), SpO2 97 %.  Physical Exam Physical Exam Constitutional:      Appearance: He is well-developed.  Eyes:     General: No scleral  icterus.    Conjunctiva/sclera: Conjunctivae normal.  Neck:     Musculoskeletal: Neck supple.  Cardiovascular:     Rate and Rhythm: Normal rate and regular rhythm.     Heart sounds: Normal heart sounds.  Pulmonary:     Effort: Pulmonary effort is normal.     Breath sounds: Normal breath sounds.  Abdominal:     General: Bowel sounds are normal.     Palpations: Abdomen is soft.     Tenderness: There is abdominal tenderness in the right lower quadrant.     Hernia: No hernia is present.    Musculoskeletal: Normal range of motion.  Lymphadenopathy:     Cervical: No cervical adenopathy.     Upper Body:     Left upper body: No supraclavicular adenopathy.     Lower Body: No  right inguinal adenopathy. No left inguinal adenopathy.  Skin:    General: Skin is warm and dry.  Neurological:     Mental Status: He is alert and oriented to person, place, and time.  Psychiatric:        Mood and Affect: Mood normal.     Data Reviewed CT scans of the abdomen and pelvis dated February 15, 2018 and November 05, 2018 completed at [facility] were independently reviewed distention to the right lower quadrant.  The appendix is well visualized on both scans.  No adenopathy is appreciated.  No bowel wall thickening noted.  A small calcified density is noted near the neck of the gallbladder in the July 2019 scan which is more prominent on the April 2020 scan.  The latter reported as showing cholelithiasis.  No bowel dilatation appreciated.  No focal asymmetry in the abdominal wall.  No evidence of intra-abdominal inflammation.  Laboratory studies of November 05, 2018 were reviewed.  With the exception of a very mild elevation of the glucose during the episode of pain CBC, lipase and comprehensive metabolic panel are unremarkable.  Assessment Atypical right lower quadrant abdominal pain.  No suggestion of biliary colic.  Plan  Observation alone at this time.  OTC Tylenol/anti-inflammatories as needed for comfort.  Patient was encouraged to call if his symptoms change in any way.  With the absence of any diarrhea, I think it is unlikely the colonoscopy or upper endoscopy would be beneficial at this time.  The location of his pain does not suggest biliary colic or peptic ulcer disease.    HPI, Physical Exam, Assessment and Plan have been scribed under the direction and in the presence of Carl Bellow, MD  Carl Living, LPN  I have completed the exam and reviewed the above documentation for accuracy and completeness.  I agree with the above.  Carl Moore has been used and any errors in dictation or transcription are unintentional.  Carl Moore, M.D.,  F.A.C.S.  Carl Moore 11/11/2018, 9:11 AM

## 2018-11-11 ENCOUNTER — Other Ambulatory Visit: Payer: Self-pay | Admitting: General Surgery

## 2018-11-11 ENCOUNTER — Telehealth: Payer: Self-pay | Admitting: *Deleted

## 2018-11-11 DIAGNOSIS — R1031 Right lower quadrant pain: Secondary | ICD-10-CM

## 2018-11-11 NOTE — Telephone Encounter (Signed)
-----  Message from Robert Bellow, MD sent at 11/11/2018  1:37 PM EDT ----- Please arrange for the patient to have an ESR and CRP lab test drawn.  Thanks.

## 2018-11-11 NOTE — Telephone Encounter (Signed)
Patient contacted today and notified that he needs to have labs drawn.   His wife is a Freight forwarder and wishes to have completed through [employer].   Order faxed to [facility] location (Fax: [phone]).

## 2018-11-12 LAB — C-REACTIVE PROTEIN: CRP: 1 mg/L (ref 0–10)

## 2018-11-12 LAB — SEDIMENTATION RATE: Sed Rate: 2 mm/hr (ref 0–15)

## 2018-11-18 ENCOUNTER — Other Ambulatory Visit: Payer: Self-pay

## 2018-11-18 ENCOUNTER — Ambulatory Visit (INDEPENDENT_AMBULATORY_CARE_PROVIDER_SITE_OTHER): Payer: Managed Care, Other (non HMO) | Admitting: Primary Care

## 2018-11-18 ENCOUNTER — Telehealth: Payer: Self-pay | Admitting: Primary Care

## 2018-11-18 DIAGNOSIS — J302 Other seasonal allergic rhinitis: Secondary | ICD-10-CM | POA: Diagnosis not present

## 2018-11-18 NOTE — Assessment & Plan Note (Signed)
Suspect allergy involvement given HPI. Will have him trial Xyzal nightly, stop Zyrtec. Start Flonase. He will update in 1-2 weeks, if no improvement in allergy symptoms then consider Singulair.  He will update.

## 2018-11-18 NOTE — Progress Notes (Signed)
Subjective:    Patient ID: Carl Moore, male    DOB: 29-Aug-1980, 38 y.o.   MRN: [id number]  HPI  Virtual Visit via Video Note  I connected with [name] on 11/18/18 at 11:00 AM EDT by a video enabled telemedicine application and verified that I am speaking with the correct person using two identifiers.   I discussed the limitations of evaluation and management by telemedicine and the availability of in person appointments. The patient expressed understanding and agreed to proceed. He is at home, I am in the office.  We attempted a video visit but the video failed. He could not get his camera to operate with the virtual program. Our visit was mostly conducted via phone.  History of Present Illness:  Carl Moore is a 38 year old male with a history of seasonal allergies who presents today with a chief complaint of allergy symptoms.  He reports itchy/watery eyes, chest congestion, sneezing, some sinus pressure, sore throat, mild cough, postnasal drip, some fatigue. He's been taking Zyrtec and Zyrtec D without improvement. He has Flonase but hasn't used it yet. Historically Zyrtec and Zyrtec-D work well as he's been on this regimen during seasonal changes for years.   He denies fevers, loss of taste/smell, shortness of breath, exposure to Covid-19. He continues to work daily and has been wearing mask/gloves. He needs a work note for today.   Observations/Objective:  Alert and oriented. Speaks in complete sentences. No cough during exam.  Assessment and Plan:  Suspect allergy involvement given HPI. Will have him trial Xyzal nightly, stop Zyrtec. Start Flonase. He will update in 1-2 weeks, if no improvement in allergy symptoms then consider Singulair.  He will update.  Follow Up Instructions:  Stop Zyrtec.  Start Xyzal for allergies.  Nasal Congestion/Ear Pressure/Sinus Pressure: Try using Flonase (fluticasone) nasal spray. Instill 1 spray in each nostril twice  daily.   Please update me in 1-2 weeks as discussed if no improvement.  It was a pleasure to see you today! Carl Bossier, NP-C    I discussed the assessment and treatment plan with the patient. The patient was provided an opportunity to ask questions and all were answered. The patient agreed with the plan and demonstrated an understanding of the instructions.   The patient was advised to call back or seek an in-person evaluation if the symptoms worsen or if the condition fails to improve as anticipated.     Pleas [name], NP    Review of Systems  Constitutional: Negative for chills and fever.  HENT: Positive for postnasal drip, rhinorrhea, sinus pressure and sore throat. Negative for congestion and ear pain.   Eyes: Positive for itching.  Respiratory: Negative for cough and shortness of breath.   Allergic/Immunologic: Positive for environmental allergies.       Past Medical History:  Diagnosis Date   Chicken pox      Social History   Socioeconomic History   Marital status: Married    Spouse name: Not on file   Number of children: Not on file   Years of education: Not on file   Highest education level: Not on file  Occupational History   Not on file  Social Needs   Financial resource strain: Not on file   Food insecurity:    Worry: Not on file    Inability: Not on file   Transportation needs:    Medical: Not on file    Non-medical: Not on file  Tobacco  Use   Smoking status: Never Smoker   Smokeless tobacco: Never Used  Substance and Sexual Activity   Alcohol use: No    Alcohol/week: 0.0 standard drinks   Drug use: No   Sexual activity: Yes  Lifestyle   Physical activity:    Days per week: Not on file    Minutes per session: Not on file   Stress: Not on file  Relationships   Social connections:    Talks on phone: Not on file    Gets together: Not on file    Attends religious service: Not on file    Active member of club or  organization: Not on file    Attends meetings of clubs or organizations: Not on file    Relationship status: Not on file   Intimate partner violence:    Fear of current or ex partner: Not on file    Emotionally abused: Not on file    Physically abused: Not on file    Forced sexual activity: Not on file  Other Topics Concern   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   No children.   Enjoys playing golf.    Past Surgical History:  Procedure Laterality Date   bone spur Left 02/2018   left big toe   KNEE ARTHROSCOPY Left    ULNAR NERVE REPAIR Left 1999    Family History  Problem Relation Age of Onset   Hypertension Mother    Stroke Father        Deceased   Hypertension Father     No Known Allergies  Current Outpatient Medications on File Prior to Visit  Medication Sig Dispense Refill   acetaminophen (TYLENOL) 500 MG tablet Take 1,000 mg by mouth every 6 (six) hours as needed for moderate pain.     cetirizine (ZYRTEC) 10 MG tablet Take 10 mg by mouth as needed for allergies.      cyclobenzaprine (FLEXERIL) 10 MG tablet Take 10 mg by mouth as needed.     ibuprofen (ADVIL,MOTRIN) 800 MG tablet Take 1 tablet (800 mg total) by mouth every 8 (eight) hours as needed for mild pain or moderate pain. 21 tablet 0   ondansetron (ZOFRAN ODT) 4 MG disintegrating tablet Take 1 tablet (4 mg total) by mouth every 8 (eight) hours as needed for nausea or vomiting. 20 tablet 0   No current facility-administered medications on file prior to visit.     There were no vitals taken for this visit.   Objective:   Physical Exam  Constitutional: He is oriented to person, place, and time.  Respiratory: Effort normal.  No cough during exam.   Neurological: He is alert and oriented to person, place, and time.  Psychiatric: He has a normal mood and affect.           Assessment & Plan:

## 2018-11-18 NOTE — Telephone Encounter (Signed)
Work note sent.  

## 2018-11-18 NOTE — Telephone Encounter (Signed)
Best number [phone] Pt called stating he had virtual appointment with and needs work note  Please send through my chart

## 2018-11-18 NOTE — Patient Instructions (Signed)
Stop Zyrtec.  Start Xyzal for allergies.  Nasal Congestion/Ear Pressure/Sinus Pressure: Try using Flonase (fluticasone) nasal spray. Instill 1 spray in each nostril twice daily.   Please update me in 1-2 weeks as discussed if no improvement.  It was a pleasure to see you today! [name], NP-C

## 2019-01-14 ENCOUNTER — Emergency Department (HOSPITAL_COMMUNITY): Payer: Managed Care, Other (non HMO)

## 2019-01-14 ENCOUNTER — Encounter (HOSPITAL_COMMUNITY): Payer: Self-pay | Admitting: Emergency Medicine

## 2019-01-14 ENCOUNTER — Other Ambulatory Visit: Payer: Self-pay

## 2019-01-14 ENCOUNTER — Emergency Department (HOSPITAL_COMMUNITY)
Admission: EM | Admit: 2019-01-14 | Discharge: 2019-01-15 | Disposition: A | Discharge status: Home / Self Care | Payer: Managed Care, Other (non HMO) | Attending: Emergency Medicine | Admitting: Emergency Medicine

## 2019-01-14 DIAGNOSIS — Z79899 Other long term (current) drug therapy: Secondary | ICD-10-CM | POA: Diagnosis not present

## 2019-01-14 DIAGNOSIS — K824 Cholesterolosis of gallbladder: Secondary | ICD-10-CM | POA: Diagnosis not present

## 2019-01-14 DIAGNOSIS — R1011 Right upper quadrant pain: Secondary | ICD-10-CM | POA: Diagnosis present

## 2019-01-14 DIAGNOSIS — R030 Elevated blood-pressure reading, without diagnosis of hypertension: Secondary | ICD-10-CM

## 2019-01-14 DIAGNOSIS — D135 Benign neoplasm of extrahepatic bile ducts: Secondary | ICD-10-CM | POA: Diagnosis not present

## 2019-01-14 LAB — CBC
HCT: 48.9 % (ref 39.0–52.0)
Hemoglobin: 16.6 g/dL (ref 13.0–17.0)
MCH: 29.6 pg (ref 26.0–34.0)
MCHC: 33.9 g/dL (ref 30.0–36.0)
MCV: 87.3 fL (ref 80.0–100.0)
Platelets: 397 10*3/uL (ref 150–400)
RBC: 5.6 MIL/uL (ref 4.22–5.81)
RDW: 13.2 % (ref 11.5–15.5)
WBC: 8.3 10*3/uL (ref 4.0–10.5)
nRBC: 0 % (ref 0.0–0.2)

## 2019-01-14 LAB — URINALYSIS, ROUTINE W REFLEX MICROSCOPIC
Bilirubin Urine: NEGATIVE
Glucose, UA: NEGATIVE mg/dL
Hgb urine dipstick: NEGATIVE
Ketones, ur: NEGATIVE mg/dL
Leukocytes,Ua: NEGATIVE
Nitrite: NEGATIVE
Protein, ur: NEGATIVE mg/dL
Specific Gravity, Urine: 1.009 (ref 1.005–1.030)
pH: 7 (ref 5.0–8.0)

## 2019-01-14 LAB — COMPREHENSIVE METABOLIC PANEL
ALT: 36 U/L (ref 0–44)
AST: 33 U/L (ref 15–41)
Albumin: 5.1 g/dL — ABNORMAL HIGH (ref 3.5–5.0)
Alkaline Phosphatase: 93 U/L (ref 38–126)
Anion gap: 11 (ref 5–15)
BUN: 11 mg/dL (ref 6–20)
CO2: 28 mmol/L (ref 22–32)
Calcium: 9.8 mg/dL (ref 8.9–10.3)
Chloride: 101 mmol/L (ref 98–111)
Creatinine, Ser: 1.13 mg/dL (ref 0.61–1.24)
GFR calc Af Amer: >60 mL/min (ref >60)
GFR calc non Af Amer: >60 mL/min (ref >60)
Glucose, Bld: 98 mg/dL (ref 70–99)
Potassium: 4.2 mmol/L (ref 3.5–5.1)
Sodium: 140 mmol/L (ref 135–145)
Total Bilirubin: 0.6 mg/dL (ref 0.3–1.2)
Total Protein: 8.4 g/dL — ABNORMAL HIGH (ref 6.5–8.1)

## 2019-01-14 LAB — LIPASE, BLOOD: Lipase: 40 U/L (ref 11–51)

## 2019-01-14 MED ORDER — SODIUM CHLORIDE 0.9% FLUSH
3.0000 mL | Freq: Once | INTRAVENOUS | Status: AC
  Administered 2019-01-15: 3 mL via INTRAVENOUS

## 2019-01-14 MED ORDER — MORPHINE SULFATE (PF) 4 MG/ML IV SOLN: 4.0000 mg | Freq: Once | INTRAVENOUS | Status: DC

## 2019-01-14 MED ORDER — HYDROMORPHONE HCL 1 MG/ML IJ SOLN
0.5000 mg | Freq: Once | INTRAMUSCULAR | Status: AC
  Administered 2019-01-14: 0.5 mg via INTRAVENOUS
  Filled 2019-01-14: qty 1

## 2019-01-14 MED ORDER — KETOROLAC TROMETHAMINE 30 MG/ML IJ SOLN
30.0000 mg | Freq: Once | INTRAMUSCULAR | Status: AC
  Administered 2019-01-15: 30 mg via INTRAVENOUS
  Filled 2019-01-14: qty 1

## 2019-01-14 MED ORDER — ONDANSETRON HCL 4 MG/2ML IJ SOLN
4.0000 mg | Freq: Once | INTRAMUSCULAR | Status: AC
  Administered 2019-01-14: 4 mg via INTRAVENOUS
  Filled 2019-01-14: qty 2

## 2019-01-14 NOTE — ED Provider Notes (Addendum)
[facility] DEPT Provider Note   CSN: [id number] Arrival date & time: 01/14/19  1837     History   Chief Complaint Chief Complaint  Patient presents with  . Abdominal Pain  . Nausea    HPI Carl Moore is a 38 y.o. male.     HPI   Patient is a 38 year old male with no significant past medical history presenting for abdominal pain.  He reports that it began rather suddenly around 7 PM when he was at the grill.  He had not eaten.  He describes it as periumbilical to the right side of his abdomen.  It is more in the epigastric region.  Patient reports that he is previously been evaluated for right-sided abdominal pain with CT scans anyone had a surgical evaluation however a cause was not identified.  He reports that this pain is worse.  Reports nausea but no vomiting.  Reports regular bowel movements and had a soft bowel movement today without melena or hematochezia.  Patient denies any abdominal surgical history.  Patient denies regular NSAID or alcohol use.  Past Medical History:  Diagnosis Date  . Chicken pox     Patient Active Problem List   Diagnosis Date Noted  . Abdominal pain 11/09/2018  . Elevated blood pressure reading 08/27/2018  . Hypertriglyceridemia 05/10/2016  . Preventative health care 03/24/2015  . Seasonal allergies 12/19/2014    Past Surgical History:  Procedure Laterality Date  . bone spur Left 02/2018   left big toe  . KNEE ARTHROSCOPY Left   . ULNAR NERVE REPAIR Left 1999        Home Medications    Prior to Admission medications   Medication Sig Start Date End Date Taking? Authorizing Provider  acetaminophen (TYLENOL) 500 MG tablet Take 1,000 mg by mouth every 6 (six) hours as needed for moderate pain.   Yes [provider]  cetirizine (ZYRTEC) 10 MG tablet Take 10 mg by mouth as needed for allergies.    Yes [provider]  cyclobenzaprine (FLEXERIL) 10 MG tablet Take 10 mg by mouth daily  as needed for muscle spasms.  12/12/17  Yes [provider]  ibuprofen (ADVIL,MOTRIN) 800 MG tablet Take 1 tablet (800 mg total) by mouth every 8 (eight) hours as needed for mild pain or moderate pain. Patient not taking: Reported on 01/14/2019 11/05/18   Ward, Ozella Almond, PA-C  ondansetron (ZOFRAN ODT) 4 MG disintegrating tablet Take 1 tablet (4 mg total) by mouth every 8 (eight) hours as needed for nausea or vomiting. Patient not taking: Reported on 01/14/2019 11/05/18   Ward, Ozella Almond, PA-C    Family History Family History  Problem Relation Age of Onset  . Hypertension Mother   . Stroke Father        Deceased  . Hypertension Father     Social History Social History   Tobacco Use  . Smoking status: Never Smoker  . Smokeless tobacco: Never Used  Substance Use Topics  . Alcohol use: No    Alcohol/week: 0.0 standard drinks  . Drug use: No     Allergies   Patient has no known allergies.   Review of Systems Review of Systems  Constitutional: Negative for chills and fever.  HENT: Negative for congestion and sore throat.   Eyes: Negative for visual disturbance.  Respiratory: Negative for cough, chest tightness and shortness of breath.   Cardiovascular: Negative for chest pain.  Gastrointestinal: Positive for abdominal pain and nausea. Negative  for constipation, diarrhea and vomiting.  Genitourinary: Negative for dysuria and flank pain.  Musculoskeletal: Negative for back pain and myalgias.  Skin: Negative for rash.  Neurological: Negative for dizziness and light-headedness.     Physical Exam Updated Vital Signs BP (!) 137/96 (BP Location: Left Arm)   Pulse 92   Temp 98.9 F (37.2 C) (Oral)   Resp 20   SpO2 99%   Physical Exam Vitals signs and nursing note reviewed.  Constitutional:      General: He is not in acute distress.    Appearance: He is well-developed.  HENT:     Head: Normocephalic and atraumatic.  Eyes:     Conjunctiva/sclera:  Conjunctivae normal.     Pupils: Pupils are equal, round, and reactive to light.  Neck:     Musculoskeletal: Normal range of motion and neck supple.  Cardiovascular:     Rate and Rhythm: Normal rate and regular rhythm.     Heart sounds: S1 normal and S2 normal. No murmur.  Pulmonary:     Effort: Pulmonary effort is normal.     Breath sounds: Normal breath sounds. No wheezing or rales.  Abdominal:     General: There is no distension.     Palpations: Abdomen is soft.     Tenderness: There is abdominal tenderness in the right upper quadrant and periumbilical area. There is no guarding.  Musculoskeletal: Normal range of motion.        General: No deformity.  Lymphadenopathy:     Cervical: No cervical adenopathy.  Skin:    General: Skin is warm and dry.     Findings: No erythema or rash.  Neurological:     Mental Status: He is alert.     Comments: Cranial nerves grossly intact. Patient moves extremities symmetrically and with good coordination.  Psychiatric:        Behavior: Behavior normal.        Thought Content: Thought content normal.        Judgment: Judgment normal.    ED Treatments / Results  Labs (all labs ordered are listed, but only abnormal results are displayed) Labs Reviewed  COMPREHENSIVE METABOLIC PANEL - Abnormal; Notable for the following components:      Result Value   Total Protein 8.4 (*)    Albumin 5.1 (*)    All other components within normal limits  URINALYSIS, ROUTINE W REFLEX MICROSCOPIC - Abnormal; Notable for the following components:   Color, Urine STRAW (*)    All other components within normal limits  LIPASE, BLOOD  CBC    EKG    Radiology Ct Renal Stone Study  Result Date: 01/14/2019 CLINICAL DATA:  Flank pain EXAM: CT ABDOMEN AND PELVIS WITHOUT CONTRAST TECHNIQUE: Multidetector CT imaging of the abdomen and pelvis was performed following the standard protocol without IV contrast. COMPARISON:  11/05/2018 FINDINGS: Lower chest: Lung bases  demonstrate no acute consolidation or pleural effusion. The heart size is normal. Hepatobiliary: Calcified gallstone. No focal hepatic abnormality. No biliary dilatation Pancreas: Unremarkable. No pancreatic ductal dilatation or surrounding inflammatory changes. Spleen: Normal in size without focal abnormality. Adrenals/Urinary Tract: Adrenal glands are normal. No hydronephrosis. Punctate stone lower pole right kidney. Bladder is normal Stomach/Bowel: Stomach is within normal limits. Appendix appears normal. No evidence of bowel wall thickening, distention, or inflammatory changes. 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 Vascular/Lymphatic: No significant vascular findings are present. No enlarged abdominal or pelvic lymph nodes. Reproductive: Prostate is unremarkable. Other: Negative for  free air or free fluid Musculoskeletal: No acute or significant osseous findings. IMPRESSION: 1. Negative for hydronephrosis or ureteral stone 2. Negative for acute appendicitis 3. Gallstone 4. Sigmoid colon diverticular disease without acute inflammatory process Electronically Signed   By: Donavan Foil M.D.   On: 01/14/2019 23:44   US Abdomen Limited Ruq  Result Date: 01/14/2019 CLINICAL DATA:  Right upper quadrant pain EXAM: ULTRASOUND ABDOMEN LIMITED RIGHT UPPER QUADRANT COMPARISON:  CT 11/05/2018 FINDINGS: Gallbladder: Gallbladder is contracted. 6 mm gallbladder wall polyp. Ring down artifact from the anterior gallbladder wall compatible with adenomyomatosis. No mobile stones, wall thickening or sonographic Murphy sign. Common bile duct: Diameter: Normal caliber, 3 mm Liver: Increased echotexture compatible with fatty infiltration. No focal abnormality or biliary ductal dilatation. Portal vein is patent on color Doppler imaging with normal direction of blood flow towards the liver. IMPRESSION: Small gallbladder wall polyp. Changes of adenomyomatosis. No cholelithiasis or acute cholecystitis.  Fatty infiltration of the liver. Electronically Signed   By: Rolm Baptise M.D.   On: 01/14/2019 22:49    Procedures Procedures (including critical care time)  Medications Ordered in ED Medications  sodium chloride flush (NS) 0.9 % injection 3 mL (0 mLs Intravenous Hold 01/14/19 2135)  ondansetron (ZOFRAN) injection 4 mg (4 mg Intravenous Given 01/14/19 2315)  HYDROmorphone (DILAUDID) injection 0.5 mg (0.5 mg Intravenous Given 01/14/19 2314)     Initial Impression / Assessment and Plan / ED Course  I have reviewed the triage vital signs and the nursing notes.  Pertinent labs & imaging results that were available during my care of the patient were reviewed by me and considered in my medical decision making (see chart for details).        This is a well-appearing 38 year old male with no significant past medical history no abdominal surgical history presenting for abdominal pain.  Patient has had repeated evaluations for sudden onset of abdominal pain that have been benign, and reports this is a different sensation.  Upon diagnosis includes cholecystitis, appendicitis, gastritis, pancreatitis, right-sided diverticulitis, gastroenteritis.  Laboratory work-up unremarkable.  Patient has no elevation WBC count.  Lipase is normal.  CMP remarkable only for slight elevation total protein and albumin.  Urinalysis unremarkable without evidence of hematuria.  Right upper quadrant ultrasound demonstrates adenomyomatosis of the gallbladder as well as a gallbladder polyp but no evidence of cholecystitis or cholelithiasis.  CT renal stone study without evidence of ureteric stone. Appendix normal.   At this time, suspect the patient could have a biliary colic resulting from disorder of gallbladder function.  I did discuss the findings of adenomyomatosis and polyp in the gallbladder.  Less suspicious for gastritis given the sudden onset nature, that is not related to eating, and he does not take NSAIDs or drink  alcohol.  Recommend the patient be reevaluated by the general surgeon that he previously had evaluation with.  Patient was given his ultrasound report.  He is instructed return for any worsening symptoms, tractable nausea or vomiting, or new symptoms.   Final Clinical Impressions(s) / ED Diagnoses   Final diagnoses:  RUQ pain  Elevated blood pressure reading    ED Discharge Orders    None       Albesa Seen, PA-C 01/15/19 0144    Langston Masker B, PA-C 01/15/19 [place], [place], DO 01/15/19 0159

## 2019-01-14 NOTE — ED Triage Notes (Signed)
Patient here from home with complaints of right lower abd pain, nausea that started 2 hours ago.

## 2019-01-15 NOTE — ED Notes (Signed)
Patient given water peanut butter and crackers; successfully completed PO challenge

## 2019-01-15 NOTE — Discharge Instructions (Addendum)
Please see the information and instructions below regarding your visit.  Your diagnoses today include:  1. Elevated blood pressure reading   2. RUQ pain     Your exam and testing today is reassuring that there is not a condition causing your abdominal pain that we immediately need to intervene on at this time.   Abdominal (belly) pain can be caused by many things. Your caregiver performed an examination and possibly ordered blood/urine tests and imaging (CT scan, x-rays, ultrasound). Many cases can be observed and treated at home after initial evaluation in the emergency department. Even though you are being discharged home, abdominal pain can be unpredictable. Therefore, you need a repeated exam if your pain does not resolve, returns, or worsens. Most patients with abdominal pain don't have to be admitted to the hospital or have surgery, but serious problems like appendicitis and gallbladder attacks can start out as nonspecific pain. Many abdominal conditions cannot be diagnosed in one visit, so follow-up evaluations are very important.  Tests performed today include: Blood counts and electrolytes Blood tests to check liver and kidney function Blood tests to check pancreas function Urine test to look for infection and pregnancy (in women) Vital signs. See below for your results today.   See side panel of your discharge paperwork for testing performed today. Vital signs are listed at the bottom of these instructions.   Medications prescribed:    Take any prescribed medications only as prescribed, and any over the counter medications only as directed on the packaging.  You may alternate ibuprofen and Tylenol for your pain as needed.  Home care instructions:  Tr y eating, but start with foods that have a lot of fluid in them. Good examples are soup, Jell-O, and popsicles. If you do OK with those foods, you can try soft, bland foods, such as plain yogurt. Foods that are high in carbohydrates  ("carbs"), like bread or saltine crackers, can help settle your stomach. Some people also find that ginger helps with nausea. You should avoid foods that have a lot of fat in them. They can make nausea worse. Call your doctor if your symptoms come back when you try to eat.  Please follow any educational materials contained in this packet.   Follow-up instructions: Please follow-up with your primary care provider in a couple days for further evaluation of your symptoms if they are not completely improved.   Please follow up with Dr. Bary Castilla.   Return instructions:  Please return to the Emergency Department if you experience worsening symptoms.  SEEK IMMEDIATE MEDICAL ATTENTION IF: The pain does not go away or becomes severe  A temperature above 101F develops  Repeated vomiting occurs (multiple episodes)  The pain becomes localized to portions of the abdomen. The right side could possibly be appendicitis. In an adult, the left lower portion of the abdomen could be colitis or diverticulitis.  Blood is being passed in stools or vomit (bright red or black tarry stools)  You develop chest pain, difficulty breathing, dizziness or fainting, or become confused, poorly responsive, or inconsolable (young children) If you have any other emergent concerns regarding your health  Additional Information:   Your vital signs today were: BP (!) 141/88    Pulse 81    Temp 97.9 F (36.6 C) (Oral)    Resp 19    SpO2 100%  If your blood pressure (BP) was elevated on multiple readings during this visit above 130 for the top number or above 80 for  the bottom number, please have this repeated by your primary care provider within one month. --------------  Thank you for allowing [place] to participate in your care today.

## 2019-01-19 ENCOUNTER — Other Ambulatory Visit: Payer: Self-pay

## 2019-01-19 ENCOUNTER — Ambulatory Visit: Payer: Managed Care, Other (non HMO) | Admitting: General Surgery

## 2019-01-19 ENCOUNTER — Encounter: Payer: Self-pay | Admitting: General Surgery

## 2019-01-19 VITALS — BP 143/84 | HR 85 | Temp 97.5°F | Resp 18 | Ht 66.0 in | Wt 163.0 lb

## 2019-01-19 DIAGNOSIS — R1011 Right upper quadrant pain: Secondary | ICD-10-CM

## 2019-01-19 MED ORDER — OXYCODONE-ACETAMINOPHEN 5-325 MG PO TABS: 1.0000 | ORAL_TABLET | ORAL | 0 refills | Status: DC | PRN

## 2019-01-19 NOTE — Progress Notes (Signed)
Patient ID: Carl Moore, male   DOB: 05-20-1981, 38 y.o.   MRN: [id number]  Chief Complaint  Patient presents with  . Follow-up    abdominal pain    HPI Carl Moore is a 38 y.o. male.  Here for follow up gall bladder and right lower quadrant pain last seen in April.  He states he returned to the hospital for right quadrant pain "constant stabbing" pain that occurred spontaneously in the right upper quadrant on 01-14-19. He states that this pain was in a different location than when he was seen in April for right lower quadrant discomfort.  It is to the right of umbilicus now.  The pain had lasted 4-5 hours. He had eaten a steak sandwich for lunch about 5hours prior to the onset of his pain. He was nauseated but no vomiting. He states he is still a little uncomfortable today. He states he was nauseated yesterday evening.  He states they completed an ultrasound that showed a gall bladder polyp. He states his bowels have been consistent. Denies any back pain or indigestion.  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 No such unusual physical activity accompanies his present episode of pain.  He has been frustrated by the variable areas of pain and inability to pinpoint what may be the source.  HPI  Past Medical History:  Diagnosis Date  . Chicken pox     Past Surgical History:  Procedure Laterality Date  . bone spur Left 02/2018   left big toe  . KNEE ARTHROSCOPY Left   . ULNAR NERVE REPAIR Left 1999    Family History  Problem Relation Age of Onset  . Hypertension Mother   . Stroke Father        Deceased  . Hypertension Father     Social History Social History   Tobacco Use  . Smoking status: Never Smoker  . Smokeless tobacco: Never Used  Substance Use Topics  . Alcohol use: No    Alcohol/week: 0.0 standard drinks  . Drug use:  No    No Known Allergies  Current Outpatient Medications  Medication Sig Dispense Refill  . acetaminophen (TYLENOL) 500 MG tablet Take 1,000 mg by mouth every 6 (six) hours as needed for moderate pain.    . cetirizine (ZYRTEC) 10 MG tablet Take 10 mg by mouth as needed for allergies.     . cyclobenzaprine (FLEXERIL) 10 MG tablet Take 10 mg by mouth daily as needed for muscle spasms.     Marland Kitchen ibuprofen (ADVIL,MOTRIN) 800 MG tablet Take 1 tablet (800 mg total) by mouth every 8 (eight) hours as needed for mild pain or moderate pain. 21 tablet 0  . ondansetron (ZOFRAN ODT) 4 MG disintegrating tablet Take 1 tablet (4 mg total) by mouth every 8 (eight) hours as needed for nausea or vomiting. 20 tablet 0  . oxyCODONE-acetaminophen (PERCOCET) 5-325 MG tablet Take 1-2 tablets by mouth every 4 (four) hours as needed for severe pain. 10 tablet 0   No current facility-administered medications for this visit.     Review of Systems Review of Systems  Constitutional: Negative.   Respiratory: Negative.   Cardiovascular: Negative.   Gastrointestinal: Positive for abdominal pain and nausea. Negative for vomiting.    Blood pressure (!) 143/84, pulse 85, temperature (!) 97.5 F (36.4 C), temperature source Temporal, resp.  rate 18, height 5\' 6"  (1.676 m), weight 163 lb (73.9 kg), SpO2 97 %.  Physical Exam Physical Exam Constitutional:      Appearance: He is well-developed.  HENT:     Mouth/Throat:     Pharynx: No oropharyngeal exudate.  Eyes:     General: No scleral icterus.    Conjunctiva/sclera: Conjunctivae normal.  Neck:     Musculoskeletal: Neck supple.  Cardiovascular:     Rate and Rhythm: Normal rate and regular rhythm.     Heart sounds: Normal heart sounds.  Pulmonary:     Effort: Pulmonary effort is normal.     Breath sounds: Normal breath sounds.  Abdominal:     General: Bowel sounds are normal.     Palpations: Abdomen is soft.     Tenderness: There is abdominal tenderness in the  right upper quadrant.  Lymphadenopathy:     Lower Body: No right inguinal adenopathy. No left inguinal adenopathy.  Skin:    General: Skin is warm and dry.  Neurological:     Mental Status: He is alert and oriented to person, place, and time.  Psychiatric:        Behavior: Behavior normal.     Data Reviewed The emergency room record from January 14, 2019 as well as the CT renal and abdominal ultrasound of the same date were independently reviewed. The appendix is well visualized.  A calcified gallstone is described on the CT.  Unremarkable pancreas.  No intestinal findings. The ultrasound describes a 6 mm gallbladder wall polyp and adenomyomatosis.  No gallbladder stones, wall thickening appreciated.  Negative sonographic Murphy sign.  Common bile duct 3 mm.  Laboratory studies from his emergency department evaluation including urinalysis, CBC, lipase and comprehensive metabolic panel were notable only for mild elevation of the serum total protein and albumin, 0.1 above normal.  Normal liver function studies and renal function.  Assessment New right upper quadrant, mid abdominal pain without clear source on laboratory, imaging or clinical exam.  Plan The patient is frustrated that the CT showed a gallstone and then the ultrasound did not.  The polyp describes in the neck of the gallbladder, and certainly possible he could have intermittent biliary symptoms, but it would be atypical to have pain lasting days based on the ultrasound imaging.  Options at this time include 1) GI referral for second opinion versus 2) elective laparoscopy and cholecystectomy with no guarantee of relief of symptoms versus 3) with the resolution of the lower quadrant pain earlier this year plan on watchful waiting with the use of narcotics for pain relief if severe episodes occur.  A small prescription of Percocet to use for severe pain is been provided.  We will plan for a 2-week interval to see if his symptoms  resolve, and if he has developed earlier abdominal discomfort for which he is required more than rare narcotic use, will consider cholecystectomy based on the size and location of the polyp and no other identifiable source for his pain.  The patient is aware to call back for any questions or new concerns.    HPI, assessment, plan and physical exam has been scribed under the direction and in the presence of Robert Bellow, MD. Carl Fetch, RN  I have completed the exam and reviewed the above documentation for accuracy and completeness.  I agree with the above.  Haematologist has been used and any errors in dictation or transcription are unintentional.  Hervey Ard, M.D., F.A.C.S.  Forest Gleason  Levante Simones 01/20/2019, 9:16 PM

## 2019-01-19 NOTE — Patient Instructions (Addendum)
The patient is aware to call back for any questions or new concerns. Call in 2 weeks with a status report or sooner if needed. Consider GI referral.

## 2019-01-20 ENCOUNTER — Encounter: Payer: Self-pay | Admitting: General Surgery

## 2019-02-16 ENCOUNTER — Encounter: Payer: Self-pay | Admitting: Family Medicine

## 2019-02-16 ENCOUNTER — Ambulatory Visit (INDEPENDENT_AMBULATORY_CARE_PROVIDER_SITE_OTHER): Payer: Managed Care, Other (non HMO) | Admitting: Family Medicine

## 2019-02-16 VITALS — Temp 99.1°F | Wt 167.0 lb

## 2019-02-16 DIAGNOSIS — Z20828 Contact with and (suspected) exposure to other viral communicable diseases: Secondary | ICD-10-CM | POA: Diagnosis not present

## 2019-02-16 DIAGNOSIS — R079 Chest pain, unspecified: Secondary | ICD-10-CM | POA: Diagnosis not present

## 2019-02-16 DIAGNOSIS — R05 Cough: Secondary | ICD-10-CM | POA: Diagnosis not present

## 2019-02-16 DIAGNOSIS — R067 Sneezing: Secondary | ICD-10-CM

## 2019-02-16 DIAGNOSIS — R059 Cough, unspecified: Secondary | ICD-10-CM

## 2019-02-16 DIAGNOSIS — Z20822 Contact with and (suspected) exposure to covid-19: Secondary | ICD-10-CM

## 2019-02-16 MED ORDER — BENZONATATE 100 MG PO CAPS: 100.0000 mg | ORAL_CAPSULE | Freq: Two times a day (BID) | ORAL | 0 refills | Status: DC | PRN

## 2019-02-16 NOTE — Progress Notes (Signed)
    I connected with [name] on 02/16/19 at  9:20 AM EDT by telephone and verified that I am speaking with the correct person using two identifiers.   I discussed the limitations, risks, security and privacy concerns of performing an evaluation and management service by video and the availability of in person appointments. I also discussed with the patient that there may be a patient responsible charge related to this service. The patient expressed understanding and agreed to proceed.  Patient location: Home Provider Location: [place] Participants: Lesleigh Noe and [name]   Subjective:     GRANT SWAGER is a 38 y.o. male presenting for Nasal Congestion (sore throat, post nasal drip, cough-some productivity with clear phlegm, some chest pressure in the breast bone. No chills, no sweats, no bodyaches, no fever.)     Sinusitis This is a new problem. The current episode started yesterday. There has been no fever. Associated symptoms include congestion, coughing (severe), sneezing and a sore throat. Pertinent negatives include no chills, ear pain, headaches, shortness of breath or sinus pressure. Past treatments include acetaminophen. The treatment provided no relief.   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  HENT: Positive for congestion, postnasal drip, rhinorrhea, sneezing and sore throat. Negative for ear pain, sinus pressure and sinus pain.   Respiratory: Positive for cough (severe). Negative for shortness of breath.   Cardiovascular: Positive for chest pain (chest congestion).  Musculoskeletal: Negative for arthralgias and myalgias.  Neurological: Negative for headaches.     Social History   Tobacco Use  Smoking Status Never Smoker  Smokeless  Tobacco Never Used        Objective:   BP Readings from Last 3 Encounters:  01/19/19 (!) 143/84  01/15/19 (!) 141/88  11/10/18 (!) 155/90   Wt Readings from Last 3 Encounters:  02/16/19 167 lb (75.8 kg)  01/19/19 163 lb (73.9 kg)  11/10/18 166 lb (75.3 kg)    Temp 99.1 F (37.3 C) Comment: per patient  Wt 167 lb (75.8 kg) Comment: per patient  BMI 26.95 kg/m   Exam Alert and oriented Speaking complete sentences w/o respiratory distress Sounds congested  Mood good      Assessment & Plan:   Problem List Items Addressed This Visit    None    Visit Diagnoses    Cough    -  Primary   Relevant Medications   benzonatate (TESSALON) 100 MG capsule   Other Relevant Orders   Novel Coronavirus, NAA ([facility])   Suspected 2019 Novel Coronavirus Infection       Relevant Orders   Novel Coronavirus, NAA ([facility])     Discussed OTC treatment for viral illness Given symptoms possible covid-19 and would recommend being tested ER precautions given Advised staying out of work and isolating until results have come back Referral to [facility] for testing made    Interactive audio and video telecommunications were attempted between this provider and patient, however failed, due to patient having technical difficulties OR patient did not have access to video capability.  We continued and completed visit with audio only.   Time: 13 minutes was spent in non-face to face time with patient.   Return if symptoms worsen or fail to improve.  Lesleigh Noe, MD

## 2019-02-18 ENCOUNTER — Encounter: Payer: Self-pay | Admitting: Family Medicine

## 2019-02-18 LAB — NOVEL CORONAVIRUS, NAA: SARS-CoV-2, NAA: NOT DETECTED

## 2019-02-18 NOTE — Telephone Encounter (Signed)
Pt left v/m since covid test is negative pt request a return to work letter sent by my chart to pt. Pt wants to return to work on 02/22/19.

## 2019-02-18 NOTE — Telephone Encounter (Signed)
Thoughts for the return to work date? I believe you saw this patient of mine. Thanks!

## 2019-02-22 ENCOUNTER — Telehealth: Payer: Self-pay | Admitting: Primary Care

## 2019-02-22 ENCOUNTER — Ambulatory Visit (INDEPENDENT_AMBULATORY_CARE_PROVIDER_SITE_OTHER): Payer: Managed Care, Other (non HMO) | Admitting: Primary Care

## 2019-02-22 ENCOUNTER — Encounter: Payer: Self-pay | Admitting: Primary Care

## 2019-02-22 DIAGNOSIS — R05 Cough: Secondary | ICD-10-CM

## 2019-02-22 DIAGNOSIS — R059 Cough, unspecified: Secondary | ICD-10-CM

## 2019-02-22 NOTE — Telephone Encounter (Signed)
Please schedule patient for a virtual visit to discuss his symptoms.  I am happy to evaluate him at his convenience. We cannot see him in the office with any of those symptoms.

## 2019-02-22 NOTE — Telephone Encounter (Signed)
Attempted to reach patient. No answer. Lvm asking him to call office.

## 2019-02-22 NOTE — Telephone Encounter (Signed)
Patient evaluated via telemedicine today.

## 2019-02-22 NOTE — Patient Instructions (Signed)
Come in tomorrow morning for the chest x-ray as discussed.  Switch to plain Robitussin or Delsym as needed for cough.  Continue Tylenol for fevers, do not exceed 3000 mg in 24 hours.  I will be in touch once I receive the x-ray results.  It was a pleasure to see you today!

## 2019-02-22 NOTE — Progress Notes (Signed)
Subjective:    Patient ID: RUTLEDGE SELSOR, male    DOB: Sep 15, 1980, 38 y.o.   MRN: [id number]  HPI     HILBERTO BURZYNSKI - 38 y.o. male  MRN [id number]  Date of Birth: September 12, 1980  PCP: Pleas [name], NP  This service was provided via telemedicine. Phone Visit performed on 02/22/2019    Rationale for phone visit along with limitations reviewed. Patient consented to telephone encounter.    Location of patient: Home Location of provider: Office [place] @ [facility] Name of referring provider: N/A   Names of persons and role in encounter: Provider: Pleas [name], NP  Patient: [name]  Other: N/A   Time on call: 6 min - 56 sec  He did not have video capability.   Subjective: No chief complaint on file.    HPI:  Mr. Savant is a 38 year old male with a history of seasonal allergies who presents today with a chief complaint of chest congestion.  He was last evaluated on 02/16/19 for a chief complaint of congestion, cough, sneezing, sore throat that began the day prior. He works with [employer] and is in contact with peoples homes, screens for Covid prior to coming. Had been wearing gloves and masks. Given his symptoms he was sent for Covid testing. He was treated with benzonatate 100 mg capsules and given conservative recommendations.  Since his visit last week he tested negative for Covid-19. Today he continues to notice post nasal drip, chest congestion, rhinorrhea, low grade fever of 99.0-99.6, mild cough. He's been taking Tylenol as needed for fevers and Robitussin DM every four hours with some improvement. Overall he's feeling better but continues to experience chest congestion and low-grade fevers.  Objective/Observations:  No physical exam or vital signs collected unless specifically identified below.   There were no vitals taken for this visit.   Respiratory status: speaks in complete sentences without evident shortness of breath.    Assessment/Plan:  Symptoms suspicious for viral etiology.  Negative COVID test recently. Given persistent low-grade fevers coupled with chest congestion and mild cough we will check a chest x-ray. Continue Tylenol.  Recommended plain Robitussin or Delsym for cough. He seems stable for outpatient follow-up.  No problem-specific Assessment & Plan notes found for this encounter.   I discussed the assessment and treatment plan with the patient. The patient was provided an opportunity to ask questions and all were answered. The patient agreed with the plan and demonstrated an understanding of the instructions.  Lab Orders  No laboratory test(s) ordered today    No orders of the defined types were placed in this encounter.   The patient was advised to call back or seek an in-person evaluation if the symptoms worsen or if the condition fails to improve as anticipated.  Pleas [name], NP    Review of Systems  Constitutional: Positive for fatigue and fever.  HENT: Positive for congestion, postnasal drip and rhinorrhea. Negative for sinus pressure and sore throat.   Respiratory: Positive for cough.        Past Medical History:  Diagnosis Date  . Chicken pox      Social History   Socioeconomic History  . Marital status: Married    Spouse name: Not on file  . Number of children: Not on file  . Years of education: Not on file  . Highest education level: Not on file  Occupational History  . Not on file  Social Needs  .  Financial resource strain: Not on file  . Food insecurity    Worry: Not on file    Inability: Not on file  . Transportation needs    Medical: Not on file    Non-medical: Not on file  Tobacco Use  . Smoking status: Never Smoker  . Smokeless tobacco: Never Used  Substance and Sexual Activity  . Alcohol use: No    Alcohol/week: 0.0 standard drinks  . Drug use: No  . Sexual activity: Yes  Lifestyle  . Physical activity    Days per week: Not on file     Minutes per session: Not on file  . Stress: Not on file  Relationships  . Social Herbalist on phone: Not on file    Gets together: Not on file    Attends religious service: Not on file    Active member of club or organization: Not on file    Attends meetings of clubs or organizations: Not on file    Relationship status: Not on file  . Intimate partner violence    Fear of current or ex partner: Not on file    Emotionally abused: Not on file    Physically abused: Not on file    Forced sexual activity: Not on file  Other Topics Concern  . 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   No children.   Enjoys playing golf.    Past Surgical History:  Procedure Laterality Date  . bone spur Left 02/2018   left big toe  . KNEE ARTHROSCOPY Left   . ULNAR NERVE REPAIR Left 1999    Family History  Problem Relation Age of Onset  . Hypertension Mother   . Stroke Father        Deceased  . Hypertension Father     No Known Allergies  Current Outpatient Medications on File Prior to Visit  Medication Sig Dispense Refill  . benzonatate (TESSALON) 100 MG capsule Take 1 capsule (100 mg total) by mouth 2 (two) times daily as needed for cough. 20 capsule 0  . cyclobenzaprine (FLEXERIL) 10 MG tablet Take 10 mg by mouth daily as needed for muscle spasms.      No current facility-administered medications on file prior to visit.     There were no vitals taken for this visit.   Objective:   Physical Exam  Constitutional: He is oriented to person, place, and time.  Respiratory: Effort normal.  No cough during phone visit  Neurological: He is alert and oriented to person, place, and time.  Psychiatric: He has a normal mood and affect.           Assessment & Plan:

## 2019-02-22 NOTE — Telephone Encounter (Signed)
Patient called today and stated that he seen Dr Einar Pheasant last week virtually for Congestion and sinus trouble. Patient stated that he is still feeling bad and insisted that he come in for visit face to face. Patient stated he was denied being seen in 2 urgent cares because of his symptoms. He stated he was tested negative for the covid and doesn't understand why he cant come in.  I explained to patient that any symptoms of congestions. Sore throat. Or respiratory symptoms we are unable to see in office right now. Patient the advised that he "might die"   Please advise   C/B # [phone]

## 2019-02-23 ENCOUNTER — Other Ambulatory Visit: Payer: Self-pay | Admitting: Primary Care

## 2019-02-23 ENCOUNTER — Encounter: Payer: Self-pay | Admitting: Primary Care

## 2019-02-23 ENCOUNTER — Ambulatory Visit (INDEPENDENT_AMBULATORY_CARE_PROVIDER_SITE_OTHER)
Admission: RE | Admit: 2019-02-23 | Discharge: 2019-02-23 | Disposition: A | Discharge status: Home / Self Care | Payer: Managed Care, Other (non HMO) | Source: Ambulatory Visit | Attending: Primary Care | Admitting: Primary Care

## 2019-02-23 DIAGNOSIS — R05 Cough: Secondary | ICD-10-CM

## 2019-02-23 DIAGNOSIS — R059 Cough, unspecified: Secondary | ICD-10-CM

## 2019-03-10 DIAGNOSIS — M2020 Hallux rigidus, unspecified foot: Secondary | ICD-10-CM | POA: Insufficient documentation

## 2019-03-10 HISTORY — DX: Hallux rigidus, unspecified foot: M20.20

## 2019-03-24 ENCOUNTER — Telehealth: Payer: Self-pay | Admitting: *Deleted

## 2019-03-24 ENCOUNTER — Encounter: Payer: Self-pay | Admitting: Primary Care

## 2019-03-24 ENCOUNTER — Other Ambulatory Visit: Payer: Self-pay | Admitting: Internal Medicine

## 2019-03-24 ENCOUNTER — Ambulatory Visit (INDEPENDENT_AMBULATORY_CARE_PROVIDER_SITE_OTHER): Payer: Managed Care, Other (non HMO) | Admitting: Primary Care

## 2019-03-24 DIAGNOSIS — R05 Cough: Secondary | ICD-10-CM | POA: Diagnosis not present

## 2019-03-24 DIAGNOSIS — R059 Cough, unspecified: Secondary | ICD-10-CM

## 2019-03-24 DIAGNOSIS — Z20822 Contact with and (suspected) exposure to covid-19: Secondary | ICD-10-CM

## 2019-03-24 MED ORDER — HYDROCOD POLST-CPM POLST ER 10-8 MG/5ML PO SUER: 5.0000 mL | Freq: Two times a day (BID) | ORAL | 0 refills | Status: DC | PRN

## 2019-03-24 MED ORDER — GUAIFENESIN-CODEINE 100-10 MG/5ML PO SYRP: 5.0000 mL | ORAL_SOLUTION | Freq: Three times a day (TID) | ORAL | 0 refills | Status: DC | PRN

## 2019-03-24 NOTE — Telephone Encounter (Signed)
Noted. New Rx sent to pharmacy.

## 2019-03-24 NOTE — Progress Notes (Signed)
Subjective:    Patient ID: Carl Moore, male    DOB: 11/01/1980, 38 y.o.   MRN: [id number]  HPI     Carl Moore - 38 y.o. male  MRN [id number]  Date of Birth: March 23, 1981  PCP: Pleas [name], NP  This service was provided via telemedicine. Phone Visit performed on 03/24/2019    Rationale for phone visit along with limitations reviewed. Patient consented to telephone encounter.    Location of patient: Home Location of provider: Office at [employer] @ [facility] Name of referring provider: N/A   Names of persons and role in encounter: Provider: Pleas [name], NP  Patient: Carl Moore  Other: N/A   Time on call: 8 min - 21 sec   Subjective: No chief complaint on file.    HPI:  Carl Moore is a 38 year old male with a history of seasonal allergies who presents today with a chief complaint of cough.  Symptoms began one week ago. He also reports chest congestion, chest tightness, chills, fevers. His fever yesterday was 101.8, fever one week ago of 100.6. He denies shortness of breath.  He works for [employer] and wears a respirator while at work. He always wears masks when outdoors and indoors, his wife works from home and his daughter goes to daycare. He's taken Mucinex, Robitussin, and Tylenol with temporary improvement.    Objective/Observations:  No physical exam or vital signs collected unless specifically identified below.   There were no vitals taken for this visit.   Respiratory status: speaks in complete sentences without evident shortness of breath. Dry cough noted throughout conversation.   Assessment/Plan:  Symptoms for the last one week. Felt worse this morning, also with fever of 101.8. Differentials include Covid-19, URI, pneumonia, bronchitis.  Start with Covid-19 test, he will go now. Will treat cough with Tussionex BID, drowsiness precautions provided. Will keep him out of work until [employer] returns. Consider  antibiotics if no improvement in symptoms within 48 hours, if higher fevers persist, and if Covid test is negative.   No problem-specific Assessment & Plan notes found for this encounter.   I discussed the assessment and treatment plan with the patient. The patient was provided an opportunity to ask questions and all were answered. The patient agreed with the plan and demonstrated an understanding of the instructions.  Lab Orders  No laboratory test(s) ordered today    No orders of the defined types were placed in this encounter.   The patient was advised to call back or seek an in-person evaluation if the symptoms worsen or if the condition fails to improve as anticipated.  Pleas [name], NP    Review of Systems  Constitutional: Positive for chills, fatigue and fever.  HENT: Positive for congestion.   Respiratory: Positive for cough.   Allergic/Immunologic: Positive for environmental allergies.       Past Medical History:  Diagnosis Date  . Chicken pox      Social History   Socioeconomic History  . Marital status: Married    Spouse name: Not on file  . Number of children: Not on file  . Years of education: Not on file  . Highest education level: Not on file  Occupational History  . Not on file  Social Needs  . Financial resource strain: Not on file  . Food insecurity    Worry: Not on file    Inability: Not on file  . Transportation needs  Medical: Not on file    Non-medical: Not on file  Tobacco Use  . Smoking status: Never Smoker  . Smokeless tobacco: Never Used  Substance and Sexual Activity  . Alcohol use: No    Alcohol/week: 0.0 standard drinks  . Drug use: No  . Sexual activity: Yes  Lifestyle  . Physical activity    Days per week: Not on file    Minutes per session: Not on file  . Stress: Not on file  Relationships  . Social Herbalist on phone: Not on file    Gets together: Not on file    Attends religious service: Not on  file    Active member of club or organization: Not on file    Attends meetings of clubs or organizations: Not on file    Relationship status: Not on file  . Intimate partner violence    Fear of current or ex partner: Not on file    Emotionally abused: Not on file    Physically abused: Not on file    Forced sexual activity: Not on file  Other Topics Concern  . 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   No children.   Enjoys playing golf.    Past Surgical History:  Procedure Laterality Date  . bone spur Left 02/2018   left big toe  . KNEE ARTHROSCOPY Left   . ULNAR NERVE REPAIR Left 1999    Family History  Problem Relation Age of Onset  . Hypertension Mother   . Stroke Father        Deceased  . Hypertension Father     No Known Allergies  Current Outpatient Medications on File Prior to Visit  Medication Sig Dispense Refill  . benzonatate (TESSALON) 100 MG capsule Take 1 capsule (100 mg total) by mouth 2 (two) times daily as needed for cough. 20 capsule 0  . cyclobenzaprine (FLEXERIL) 10 MG tablet Take 10 mg by mouth daily as needed for muscle spasms.      No current facility-administered medications on file prior to visit.     There were no vitals taken for this visit.   Objective:   Physical Exam  Constitutional: He is oriented to person, place, and time.  Respiratory: Effort normal. No respiratory distress.  Dry cough noted throughout visit  Neurological: He is alert and oriented to person, place, and time.           Assessment & Plan:

## 2019-03-24 NOTE — Assessment & Plan Note (Signed)
Symptoms for the last one week. Felt worse this morning, also with fever of 101.8. Differentials include Covid-19, URI, pneumonia, bronchitis.  Start with Covid-19 test, he will go now. Will treat cough with Tussionex BID, drowsiness precautions provided. Will keep him out of work until [employer] returns. Consider antibiotics if no improvement in symptoms within 48 hours, if higher fevers persist, and if Covid test is negative.

## 2019-03-24 NOTE — Telephone Encounter (Signed)
Christy pharmacist at [facility] left a voicemail stating that they received a script for Tussionex. Alyse Low stated that this is on a manufacturer's back order and they have not been able to get this in weeks. Christy requested that a substitute be sent in for this medication.

## 2019-03-24 NOTE — Patient Instructions (Signed)
Go to [facility] building for [employer] testing.  You may take the cough suppressant every 12 hours as needed for cough and rest. Caution this medication contains codeine which may cause drowsiness.   Stay at home for now.   You can take Tylenol as needed for fevers. Do not exceed 3000 mg in 24 hours.  Stop Mucinex.  It was a pleasure to see you today!

## 2019-03-25 LAB — NOVEL CORONAVIRUS, NAA: SARS-CoV-2, NAA: NOT DETECTED

## 2019-03-30 ENCOUNTER — Telehealth: Payer: Self-pay | Admitting: Primary Care

## 2019-03-30 DIAGNOSIS — R05 Cough: Secondary | ICD-10-CM

## 2019-03-30 DIAGNOSIS — R059 Cough, unspecified: Secondary | ICD-10-CM

## 2019-03-30 MED ORDER — GUAIFENESIN-CODEINE 100-10 MG/5ML PO SYRP: 5.0000 mL | ORAL_SOLUTION | Freq: Three times a day (TID) | ORAL | 0 refills | Status: DC | PRN

## 2019-03-30 NOTE — Telephone Encounter (Signed)
Noted.  Refill sent to pharmacy. If cough persists recommend reevaluation. He endorsed during his most recent visit with me that he felt better and that cough had improved.

## 2019-03-30 NOTE — Telephone Encounter (Signed)
Spoken to patient and he stated not that much better (he was coughing over the phone a few times talking to me).  Patient request that if Carl Moore just refill and hopefully this will help him clear it.

## 2019-03-30 NOTE — Telephone Encounter (Signed)
Best number [phone]  Pt called stating [facility] [address] is waiting to hear back from you  Regarding  Robitussin   Pt still having cough and out of meds

## 2019-03-30 NOTE — Telephone Encounter (Signed)
Is his cough any better? If so that he can try Delsym or regular Robitussin as the cough may persist for a few weeks after he feels better. If not then I can provide 1 last refill of his prescribed Robitussin, but that will be the last refill as it is a controlled substance.

## 2019-04-29 ENCOUNTER — Ambulatory Visit (INDEPENDENT_AMBULATORY_CARE_PROVIDER_SITE_OTHER): Payer: Managed Care, Other (non HMO) | Admitting: Internal Medicine

## 2019-04-29 ENCOUNTER — Ambulatory Visit (INDEPENDENT_AMBULATORY_CARE_PROVIDER_SITE_OTHER): Payer: Managed Care, Other (non HMO)

## 2019-04-29 ENCOUNTER — Telehealth: Payer: Self-pay | Admitting: Primary Care

## 2019-04-29 ENCOUNTER — Encounter: Payer: Self-pay | Admitting: Internal Medicine

## 2019-04-29 VITALS — BP 138/76

## 2019-04-29 DIAGNOSIS — R519 Headache, unspecified: Secondary | ICD-10-CM

## 2019-04-29 DIAGNOSIS — Z23 Encounter for immunization: Secondary | ICD-10-CM | POA: Diagnosis not present

## 2019-04-29 MED ORDER — CYCLOBENZAPRINE HCL 10 MG PO TABS: 10.0000 mg | ORAL_TABLET | Freq: Every day | ORAL | 0 refills | Status: DC | PRN

## 2019-04-29 MED ORDER — IBUPROFEN 800 MG PO TABS: 800.0000 mg | ORAL_TABLET | Freq: Three times a day (TID) | ORAL | 0 refills | Status: DC | PRN

## 2019-04-29 NOTE — Telephone Encounter (Signed)
Patient had a virtual visit today with Rollene Fare. Patient's requesting a work note be sent to him on my chart.

## 2019-04-29 NOTE — Telephone Encounter (Signed)
Letter completed and released to [employer]

## 2019-04-29 NOTE — Progress Notes (Signed)
Subjective:    Patient ID: Carl Moore, male    DOB: 1980-12-27, 38 y.o.   MRN: [id number]  Virtual Visit via Telephone Note  I connected with Carl Moore on 05/01/19 at 11:15 AM EDT by telephone and verified that I am speaking with the correct person using two identifiers.  Location: Patient: Home Provider: Office   I discussed the limitations, risks, security and privacy concerns of performing an evaluation and management service by telephone and the availability of in person appointments. I also discussed with the patient that there may be a patient responsible charge related to this service. The patient expressed understanding and agreed to proceed.   History of Present Illness:  Patient reports frequent headaches over the last week.  He reports the headaches are located in different places at different times.  He has noticed pain in the back of his head and in his forehead.  He describes the pain as pressure or squeezing.  He denies visual changes or dizziness.  He denies runny nose, nasal congestion, ear pain, sore throat or cough.  He has taken Tylenol with minimal relief.  He denies any injury to the neck.    Past Medical History:  Diagnosis Date  . Chicken pox     Current Outpatient Medications  Medication Sig Dispense Refill  . cyclobenzaprine (FLEXERIL) 10 MG tablet Take 1 tablet (10 mg total) by mouth daily as needed for muscle spasms. 30 tablet 0  . ibuprofen (ADVIL) 800 MG tablet Take 1 tablet (800 mg total) by mouth every 8 (eight) hours as needed. 30 tablet 0   No current facility-administered medications for this visit.     No Known Allergies  Family History  Problem Relation Age of Onset  . Hypertension Mother   . Stroke Father        Deceased  . Hypertension Father     Social History   Socioeconomic History  . Marital status: Married    Spouse name: Not on file  . Number of children: Not on file  . Years of education: Not on file  .  Highest education level: Not on file  Occupational History  . Not on file  Social Needs  . Financial resource strain: Not on file  . Food insecurity    Worry: Not on file    Inability: Not on file  . Transportation needs    Medical: Not on file    Non-medical: Not on file  Tobacco Use  . Smoking status: Never Smoker  . Smokeless tobacco: Never Used  Substance and Sexual Activity  . Alcohol use: No    Alcohol/week: 0.0 standard drinks  . Drug use: No  . Sexual activity: Yes  Lifestyle  . Physical activity    Days per week: Not on file    Minutes per session: Not on file  . Stress: Not on file  Relationships  . Social Herbalist on phone: Not on file    Gets together: Not on file    Attends religious service: Not on file    Active member of club or organization: Not on file    Attends meetings of clubs or organizations: Not on file    Relationship status: Not on file  . Intimate partner violence    Fear of current or ex partner: Not on file    Emotionally abused: Not on file    Physically abused: Not on file    Forced sexual activity:  Not on file  Other Topics Concern  . 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   No children.   Enjoys playing golf.     Constitutional: Patient reports headaches.  Denies fever, malaise, fatigue, or abrupt weight changes.  HEENT: Denies eye pain, eye redness, ear pain, ringing in the ears, wax buildup, runny nose, nasal congestion, bloody nose, or sore throat. Respiratory: Denies difficulty breathing, shortness of breath, cough or sputum production.   Cardiovascular: Denies chest pain, chest tightness, palpitations or swelling in the hands or feet.  Musculoskeletal: Denies decrease in range of motion, difficulty with gait, muscle pain or joint pain and swelling.  Neurological: Denies dizziness, difficulty with memory, difficulty with speech or problems with balance and coordination.     No other specific complaints in a complete review of systems (except as listed in HPI above).  Observations/Objective:  BP 138/76  Wt Readings from Last 3 Encounters:  02/16/19 167 lb (75.8 kg)  01/19/19 163 lb (73.9 kg)  11/10/18 166 lb (75.3 kg)    Neurological: Alert and oriented.    BMET    Component Value Date/Time   NA 140 01/14/2019 1921   K 4.2 01/14/2019 1921   CL 101 01/14/2019 1921   CO2 28 01/14/2019 1921   GLUCOSE 98 01/14/2019 1921   BUN 11 01/14/2019 1921   CREATININE 1.13 01/14/2019 1921   CALCIUM 9.8 01/14/2019 1921   GFRNONAA >60 01/14/2019 1921   GFRAA >60 01/14/2019 1921    Lipid Panel     Component Value Date/Time   CHOL 201 (H) 06/16/2017 1425   TRIG (H) 06/16/2017 1425    476.0 Triglyceride is over 400; calculations on Lipids are invalid.   HDL 39.50 06/16/2017 1425   CHOLHDL 5 06/16/2017 1425   VLDL 45.2 (H) 05/03/2016 1002   LDLCALC 124 (H) 03/16/2015 0830    CBC    Component Value Date/Time   WBC 8.3 01/14/2019 1921   RBC 5.60 01/14/2019 1921   HGB 16.6 01/14/2019 1921   HCT 48.9 01/14/2019 1921   PLT 397 01/14/2019 1921   MCV 87.3 01/14/2019 1921   MCH 29.6 01/14/2019 1921   MCHC 33.9 01/14/2019 1921   RDW 13.2 01/14/2019 1921   LYMPHSABS 1.8 02/15/2018 1921   MONOABS 0.5 02/15/2018 1921   EOSABS 0.1 02/15/2018 1921   BASOSABS 0.0 02/15/2018 1921    Hgb A1C Lab Results  Component Value Date   HGBA1C 5.2 03/16/2015       Assessment and Plan:  Frequent Headaches:  Likely tension headaches as he reports increase in stress Advised Ibuprofen would be better than Tylenol Rx for Ibuprofen 600 mg every 8 hours as needed with food.  Avoid all other NSAIDs OTC Flexeril refilled today Heat massage may be helpful  Return/ER precautions discussed  Follow Up Instructions:    I discussed the assessment and treatment plan with the patient. The patient was provided an opportunity to ask questions and all were answered.  The patient agreed with the plan and demonstrated an understanding of the instructions.   The patient was advised to call back or seek an in-person evaluation if the symptoms worsen or if the condition fails to improve as anticipated.  I provided 6 minutes of non-face-to-face time during this encounter.   Webb Silversmith, NP

## 2019-05-01 ENCOUNTER — Encounter: Payer: Self-pay | Admitting: Internal Medicine

## 2019-05-01 NOTE — Patient Instructions (Signed)
Tension Headache, Adult A tension headache is pain, pressure, or aching in your head. Tension headaches can last from 30 minutes to several days. Follow these instructions at home: Managing pain  Take over-the-counter and prescription medicines only as told by your doctor.  When you have a headache, lie down in a dark, quiet room.  If told, put ice on your head and neck: ? Put ice in a plastic bag. ? Place a towel between your skin and the bag. ? Leave the ice on for 20 minutes, 2-3 times a day.  If told, put heat on the back of your neck. Do this as often as your doctor tells you to. Use the kind of heat that your doctor recommends, such as a moist heat pack or a heating pad. ? Place a towel between your skin and the heat. ? Leave the heat on for 20-30 minutes. ? Remove the heat if your skin turns bright red. Eating and drinking  Eat meals on a regular schedule.  Watch how much alcohol you drink: ? If you are a woman and are not pregnant, do not drink more than 1 drink a day. ? If you are a man, do not drink more than 2 drinks a day.  Drink enough fluid to keep your pee (urine) pale yellow.  Do not use a lot of caffeine, or stop using caffeine. Lifestyle  Get enough sleep. Get 7-9 hours of sleep each night. Or get the amount of sleep that your doctor tells you to.  At bedtime, remove all electronic devices from your room. Examples of electronic devices are computers, phones, and tablets.  Find ways to lessen your stress. Some things that can lessen stress are: ? Exercise. ? Deep breathing. ? Yoga. ? Music. ? Positive thoughts.  Sit up straight. Do not tighten (tense) your muscles.  Do not use any products that have nicotine or tobacco in them, such as cigarettes and e-cigarettes. If you need help quitting, ask your doctor. General instructions   Keep all follow-up visits as told by your doctor. This is important.  Avoid things that can bring on headaches. Keep a  journal to find out if certain things bring on headaches. For example, write down: ? What you eat and drink. ? How much sleep you get. ? Any change to your diet or medicines. Contact a doctor if:  Your headache does not get better.  Your headache comes back.  You have a headache and sounds, light, or smells bother you.  You feel sick to your stomach (nauseous) or you throw up (vomit).  Your stomach hurts. Get help right away if:  You suddenly get a very bad headache along with any of these: ? A stiff neck. ? Feeling sick to your stomach. ? Throwing up. ? Feeling weak. ? Trouble seeing. ? Feeling short of breath. ? A rash. ? Feeling unusually sleepy. ? Trouble speaking. ? Pain in your eye or ear. ? Trouble walking or balancing. ? Feeling like you will pass out (faint). ? Passing out. Summary  A tension headache is pain, pressure, or aching in your head.  Tension headaches can last from 30 minutes to several days.  Lifestyle changes and medicines may help relieve pain. This information is not intended to replace advice given to you by your health care provider. Make sure you discuss any questions you have with your health care provider. Document Released: 10/09/2009 Document Revised: 06/27/2017 Document Reviewed: 10/25/2016 Elsevier Patient Education  2020 Elsevier   Inc.  

## 2019-05-04 ENCOUNTER — Encounter (HOSPITAL_COMMUNITY): Payer: Self-pay | Admitting: Family Medicine

## 2019-05-04 ENCOUNTER — Other Ambulatory Visit: Payer: Self-pay

## 2019-05-04 ENCOUNTER — Emergency Department (HOSPITAL_COMMUNITY): Payer: Managed Care, Other (non HMO)

## 2019-05-04 ENCOUNTER — Emergency Department (HOSPITAL_BASED_OUTPATIENT_CLINIC_OR_DEPARTMENT_OTHER)
Admission: EM | Admit: 2019-05-04 | Discharge: 2019-05-04 | Discharge status: Absent without leave | Payer: Managed Care, Other (non HMO)

## 2019-05-04 DIAGNOSIS — R0602 Shortness of breath: Secondary | ICD-10-CM | POA: Insufficient documentation

## 2019-05-04 DIAGNOSIS — Z20828 Contact with and (suspected) exposure to other viral communicable diseases: Secondary | ICD-10-CM | POA: Insufficient documentation

## 2019-05-04 DIAGNOSIS — R0789 Other chest pain: Secondary | ICD-10-CM | POA: Insufficient documentation

## 2019-05-04 DIAGNOSIS — R079 Chest pain, unspecified: Secondary | ICD-10-CM | POA: Diagnosis present

## 2019-05-04 LAB — BASIC METABOLIC PANEL
Anion gap: 8 (ref 5–15)
BUN: 14 mg/dL (ref 6–20)
CO2: 29 mmol/L (ref 22–32)
Calcium: 9.3 mg/dL (ref 8.9–10.3)
Chloride: 101 mmol/L (ref 98–111)
Creatinine, Ser: 1.15 mg/dL (ref 0.61–1.24)
GFR calc Af Amer: >60 mL/min (ref >60)
GFR calc non Af Amer: >60 mL/min (ref >60)
Glucose, Bld: 85 mg/dL (ref 70–99)
Potassium: 4.8 mmol/L (ref 3.5–5.1)
Sodium: 138 mmol/L (ref 135–145)

## 2019-05-04 LAB — CBC
HCT: 49.8 % (ref 39.0–52.0)
Hemoglobin: 15.5 g/dL (ref 13.0–17.0)
MCH: 27.3 pg (ref 26.0–34.0)
MCHC: 31.1 g/dL (ref 30.0–36.0)
MCV: 87.7 fL (ref 80.0–100.0)
Platelets: 505 10*3/uL — ABNORMAL HIGH (ref 150–400)
RBC: 5.68 MIL/uL (ref 4.22–5.81)
RDW: 13.6 % (ref 11.5–15.5)
WBC: 11.7 10*3/uL — ABNORMAL HIGH (ref 4.0–10.5)
nRBC: 0 % (ref 0.0–0.2)

## 2019-05-04 LAB — TROPONIN I (HIGH SENSITIVITY): Troponin I (High Sensitivity): 5 ng/L (ref <18)

## 2019-05-04 MED ORDER — SODIUM CHLORIDE 0.9% FLUSH: 3.0000 mL | Freq: Once | INTRAVENOUS | Status: DC

## 2019-05-04 NOTE — ED Notes (Signed)
TEXTING ON HIS PHONE

## 2019-05-04 NOTE — ED Triage Notes (Signed)
Patient is complaining of left sided chest pain that started two day while cooking. Pain is described as sharp, throbbing with no radiation. Associated symptoms of shortness of breath. Patient appears in no acute distress. Skin is warm and dry. Respirations are even, regular, and unlabored.

## 2019-05-05 ENCOUNTER — Other Ambulatory Visit: Payer: Self-pay

## 2019-05-05 ENCOUNTER — Emergency Department (HOSPITAL_COMMUNITY)
Admission: EM | Admit: 2019-05-05 | Discharge: 2019-05-05 | Disposition: A | Discharge status: Home / Self Care | Payer: Managed Care, Other (non HMO) | Attending: Emergency Medicine | Admitting: Emergency Medicine

## 2019-05-05 DIAGNOSIS — Z20822 Contact with and (suspected) exposure to covid-19: Secondary | ICD-10-CM

## 2019-05-05 DIAGNOSIS — R0789 Other chest pain: Secondary | ICD-10-CM

## 2019-05-05 LAB — TROPONIN I (HIGH SENSITIVITY): Troponin I (High Sensitivity): 5 ng/L (ref <18)

## 2019-05-05 NOTE — ED Provider Notes (Signed)
[facility] DEPT Provider Note  CSN: [id number] Arrival date & time: 05/04/19 1900  Chief Complaint(s) Chest Pain and Shortness of Breath  HPI Carl Moore is a 38 y.o. male    Chest Pain Pain location:  Substernal area Pain quality: pressure   Pain radiates to:  Does not radiate Pain severity:  Mild Onset quality:  Gradual Duration: intermittent for several days; constant  for 12 hrs. Progression:  Unchanged Chronicity:  New Relieved by:  Rest Worsened by:  Exertion and movement Ineffective treatments:  Rest Associated symptoms: anxiety, cough (mild), fever (101.9 yesterday) and headache (intermittent; not currently)   Associated symptoms: no abdominal pain, no back pain, no dizziness, no fatigue, no nausea, no palpitations, no shortness of breath (denied to me, though he mentioned it in triage) and no vomiting   Risk factors: male sex   Risk factors: no coronary artery disease, no diabetes mellitus, no high cholesterol, no hypertension, no immobilization and no smoking   Shortness of Breath Associated symptoms: chest pain, cough (mild), fever (101.9 yesterday) and headaches (intermittent; not currently)   Associated symptoms: no abdominal pain and no vomiting     Past Medical History Past Medical History:  Diagnosis Date  . Chicken pox    Patient Active Problem List   Diagnosis Date Noted  . Abdominal pain 11/09/2018  . Elevated blood pressure reading 08/27/2018  . Hypertriglyceridemia 05/10/2016  . Cough 10/31/2015  . Preventative health care 03/24/2015  . Seasonal allergies 12/19/2014   Home Medication(s) Prior to Admission medications   Medication Sig Start Date End Date Taking? Authorizing Provider  cyclobenzaprine (FLEXERIL) 10 MG tablet Take 1 tablet (10 mg total) by mouth daily as needed for muscle spasms. 04/29/19  Yes Jearld Fenton, NP  ibuprofen (ADVIL) 800 MG tablet Take 1 tablet (800 mg total) by mouth every 8  (eight) hours as needed. 04/29/19  Yes Baity, Coralie Keens, NP  MELATONIN PO Take 1 tablet by mouth at bedtime as needed (sleep).   Yes [provider]                                                                                                                                    Past Surgical History Past Surgical History:  Procedure Laterality Date  . bone spur Left 02/2018   left big toe  . KNEE ARTHROSCOPY Left   . ULNAR NERVE REPAIR Left 1999   Family History Family History  Problem Relation Age of Onset  . Hypertension Mother   . Stroke Father        Deceased  . Hypertension Father     Social History Social History   Tobacco Use  . Smoking status: Never Smoker  . Smokeless tobacco: Never Used  Substance Use Topics  . Alcohol use: No    Alcohol/week: 0.0 standard drinks  . Drug use: No   Allergies Patient has no  known allergies.  Review of Systems Review of Systems  Constitutional: Positive for fever (101.9 yesterday). Negative for fatigue.  Respiratory: Positive for cough (mild). Negative for shortness of breath (denied to me, though he mentioned it in triage).   Cardiovascular: Positive for chest pain. Negative for palpitations.  Gastrointestinal: Negative for abdominal pain, nausea and vomiting.  Musculoskeletal: Negative for back pain.  Neurological: Positive for headaches (intermittent; not currently). Negative for dizziness.   All other systems are reviewed and are negative for acute change except as noted in the HPI  Physical Exam Vital Signs  I have reviewed the triage vital signs BP (!) 153/88 (BP Location: Left Arm)   Pulse 82   Temp 99.4 F (37.4 C) (Oral)   Resp 14   Ht 5\' 6"  (1.676 m)   Wt 77.4 kg   SpO2 100%   BMI 27.55 kg/m   Physical Exam Vitals signs reviewed.  Constitutional:      General: He is not in acute distress.    Appearance: He is well-developed. He is not diaphoretic.  HENT:     Head: Normocephalic and atraumatic.      Nose: Nose normal.  Eyes:     General: No scleral icterus.       Right eye: No discharge.        Left eye: No discharge.     Conjunctiva/sclera: Conjunctivae normal.     Pupils: Pupils are equal, round, and reactive to light.  Neck:     Musculoskeletal: Normal range of motion and neck supple.  Cardiovascular:     Rate and Rhythm: Normal rate and regular rhythm.     Heart sounds: No murmur. No friction rub. No gallop.   Pulmonary:     Effort: Pulmonary effort is normal. No respiratory distress.     Breath sounds: Normal breath sounds. No stridor. No rales.  Chest:     Chest wall: No tenderness.  Abdominal:     General: There is no distension.     Palpations: Abdomen is soft.     Tenderness: There is no abdominal tenderness.  Musculoskeletal:        General: No tenderness.  Skin:    General: Skin is warm and dry.     Findings: No erythema or rash.  Neurological:     Mental Status: He is alert and oriented to person, place, and time.     ED Results and Treatments Labs (all labs ordered are listed, but only abnormal results are displayed) Labs Reviewed  CBC - Abnormal; Notable for the following components:      Result Value   WBC 11.7 (*)    Platelets 505 (*)    All other components within normal limits  NOVEL CORONAVIRUS, NAA (HOSP ORDER, SEND-OUT TO REF LAB; TAT 18-24 HRS)  BASIC METABOLIC PANEL  TROPONIN I (HIGH SENSITIVITY)  TROPONIN I (HIGH SENSITIVITY)  EKG  EKG Interpretation  Date/Time:  Wednesday May 05 2019 01:40:01 EDT Ventricular Rate:  84 PR Interval:    QRS Duration: 99 QT Interval:  351 QTC Calculation: 415 R Axis:   55 Text Interpretation:  Sinus rhythm ST elev, probable normal early repol pattern No significant change since last tracing Confirmed by Addison Lank [phone]) on 05/05/2019 1:53:30 AM      Radiology Dg Chest  2 View  Result Date: 05/04/2019 CLINICAL DATA:  Left-sided chest pain for 2 days. Shortness of breath. EXAM: CHEST - 2 VIEW COMPARISON:  02/23/2019 FINDINGS: The heart size and mediastinal contours are within normal limits. Both lungs are clear. No evidence of pneumothorax or pleural effusion. The visualized skeletal structures are unremarkable. IMPRESSION: Negative.  No active cardiopulmonary disease. Electronically Signed   By: Marlaine Hind M.D.   On: 05/04/2019 20:21    Pertinent labs & imaging results that were available during my care of the patient were reviewed by me and considered in my medical decision making (see chart for details).  Medications Ordered in ED Medications  sodium chloride flush (NS) 0.9 % injection 3 mL (has no administration in time range)                                                                                                                                    Procedures Procedures  (including critical care time)  Medical Decision Making / ED Course I have reviewed the nursing notes for this encounter and the patient's prior records (if available in EHR or on provided paperwork).   KYDAN POPESCU was evaluated in Emergency Department on 05/05/2019 for the symptoms described in the history of present illness. He was evaluated in the context of the global COVID-19 pandemic, which necessitated consideration that the patient might be at risk for infection with the SARS-CoV-2 virus that causes COVID-19. Institutional protocols and algorithms that pertain to the evaluation of patients at risk for COVID-19 are in a state of rapid change based on information released by regulatory bodies including the CDC and federal and state organizations. These policies and algorithms were followed during the patient's care in the ED.  Atypical chest pain.  EKG without acute ischemic changes or evidence of pericarditis.  Troponin IX hours after pain onset negative.  Given the  duration of the pain, and his low heart score, feel this is sufficient to rule out ACS.  Presentation not classic for aortic dissection or esophageal perforation.  Low suspicion for pulmonary embolism; patient is PERC negative.  Chest x-ray without evidence suggestive of pneumonia, pneumothorax, pneumomediastinum.  No abnormal contour of the mediastinum to suggest dissection. No evidence of acute injuries.  Given reported fever, COVID test was performed.  No evidence of pneumonia on chest x-ray.  Patient is satting well on room air and in no acute distress.  Appropriate for outpatient management.  Patient evaluated, tested and sent home  with instructions for home care and Quarantine. Instructed to seek further care if symptoms worsen.  Is set up with follow up for a virtual visit with PCP in next 24-48 hours.        Final Clinical Impression(s) / ED Diagnoses Final diagnoses:  Atypical chest pain  Suspected COVID-19 virus infection    The patient appears reasonably screened and/or stabilized for discharge and I doubt any other medical condition or other [facility] requiring further screening, evaluation, or treatment in the ED at this time prior to discharge.  Disposition: Discharge  Condition: Good  I have discussed the results, Dx and Tx plan with the patient who expressed understanding and agree(s) with the plan. Discharge instructions discussed at great length. The patient was given strict return precautions who verbalized understanding of the instructions. No further questions at time of discharge.    ED Discharge Orders    None       Follow Up: Pleas [name], NP [place] [address] [phone]  Schedule an appointment as soon as possible for a visit  If symptoms do not improve or  worsen      This chart was dictated using voice recognition software.  Despite best efforts to proofread,  errors can occur which can change the documentation meaning.    Fatima Blank, MD 05/05/19 [phone]

## 2019-05-06 LAB — NOVEL CORONAVIRUS, NAA (HOSP ORDER, SEND-OUT TO REF LAB; TAT 18-24 HRS): SARS-CoV-2, NAA: NOT DETECTED

## 2019-05-10 ENCOUNTER — Encounter: Payer: Managed Care, Other (non HMO) | Admitting: Primary Care

## 2019-05-10 NOTE — Progress Notes (Deleted)
   Subjective:    Patient ID: Carl Moore, male    DOB: 01-Sep-1980, 38 y.o.   MRN: [id number]  HPI     Carl Moore - 38 y.o. male  MRN [id number]  Date of Birth: 1981/01/15  PCP: Pleas [name], NP  This service was provided via telemedicine. Phone Visit performed on 05/10/2019    Rationale for phone visit along with limitations reviewed. Patient consented to telephone encounter.   Attempted to contact patient twice via phone, left voicemail. No answer second either time.   Location of patient: Home Location of provider: Office [place] [facility] Name of referring provider: N/A   Names of persons and role in encounter: Provider: Pleas [name], NP  Patient: Carl Moore  Other: N/A   Time on call: *** - ***   Subjective: No chief complaint on file.    HPI:  Carl Moore is a 38 year old male with a history of seasonal allergies, hypertriglyceridemia who presents today for emergency department follow up.  He presented to [facility] on 05/05/19 with a 12 hour history of chest pain, shortness of breath, cough, fever (101.9) headache.   During his stay in the ED he underwent ECG (without acute changes or pericarditis), labs (negative troponin x2, mild leukocytosis, negative Covid-19), chest xray (negative for pneumonia or other abnormality). Given stability of presentation and negative testing he was discharged home.  Since his ED visit    Objective/Observations:  No physical exam or vital signs collected unless specifically identified below.   There were no vitals taken for this visit.   Respiratory status: speaks in complete sentences without evident shortness of breath.   Assessment/Plan:  No problem-specific Assessment & Plan notes found for this encounter.   I discussed the assessment and treatment plan with the patient. The patient was provided an opportunity to ask questions and all were answered. The patient agreed with the plan and  demonstrated an understanding of the instructions.  Lab Orders  No laboratory test(s) ordered today    No orders of the defined types were placed in this encounter.   The patient was advised to call back or seek an in-person evaluation if the symptoms worsen or if the condition fails to improve as anticipated.  Pleas [name], NP    Review of Systems     Objective:   Physical Exam         Assessment & Plan:

## 2019-05-10 NOTE — Patient Instructions (Signed)
Patient never answered phone for visit.

## 2019-07-30 HISTORY — PX: LIGAMENT REPAIR: SHX5444

## 2019-08-18 ENCOUNTER — Ambulatory Visit (INDEPENDENT_AMBULATORY_CARE_PROVIDER_SITE_OTHER): Payer: 59 | Admitting: Internal Medicine

## 2019-08-18 ENCOUNTER — Encounter: Payer: Self-pay | Admitting: Internal Medicine

## 2019-08-18 DIAGNOSIS — F5104 Psychophysiologic insomnia: Secondary | ICD-10-CM

## 2019-08-18 MED ORDER — TRAZODONE HCL 50 MG PO TABS: 25.0000 mg | ORAL_TABLET | Freq: Every evening | ORAL | 0 refills | Status: DC | PRN

## 2019-08-18 NOTE — Patient Instructions (Signed)

## 2019-08-18 NOTE — Progress Notes (Signed)
Virtual Visit via Telephone Note  I connected with Carl Moore on 08/18/19 at  3:00 PM EST by telephone and verified that I am speaking with the correct person using two identifiers.  Location: Patient: Home Provider: Office   I discussed the limitations, risks, security and privacy concerns of performing an evaluation and management service by telephone and the availability of in person appointments. I also discussed with the patient that there may be a patient responsible charge related to this service. The patient expressed understanding and agreed to proceed.   History of Present Illness:  Pt reports difficulty falling and staying asleep. He reports this started 1 year ago but has gotten much worse in the last 2 weeks. He is averaging 3-4 hours per night. His wife reports he does snore, but denies apneic episodes or waking up coughing. He does not feel rested when he wakes up but he is not napping during the day. He is currently taking Melatonin 5 mg and has taken up to 10 mg without much relief. He has taken Flexeril in the past for muscle spasms which has also helped him sleep but he is out of this medication. He reports increase in stress and thinks this is why he is "unable to turn his brain off".   Past Medical History:  Diagnosis Date  . Chicken pox     Current Outpatient Medications  Medication Sig Dispense Refill  . cyclobenzaprine (FLEXERIL) 10 MG tablet Take 1 tablet (10 mg total) by mouth daily as needed for muscle spasms. 30 tablet 0  . ibuprofen (ADVIL) 800 MG tablet Take 1 tablet (800 mg total) by mouth every 8 (eight) hours as needed. 30 tablet 0  . MELATONIN PO Take 1 tablet by mouth at bedtime as needed (sleep).     No current facility-administered medications for this visit.    No Known Allergies  Family History  Problem Relation Age of Onset  . Hypertension Mother   . Stroke Father        Deceased  . Hypertension Father     Social History    Socioeconomic History  . Marital status: Married    Spouse name: Not on file  . Number of children: Not on file  . Years of education: Not on file  . Highest education level: Not on file  Occupational History  . Not on file  Tobacco Use  . Smoking status: Never Smoker  . Smokeless tobacco: Never Used  Substance and Sexual Activity  . Alcohol use: No    Alcohol/week: 0.0 standard drinks  . Drug use: No  . Sexual activity: Yes  Other Topics Concern  . 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   No children.   Enjoys playing golf.   Social Determinants of Health   Financial Resource Strain:   . Difficulty of Paying Living Expenses: Not on file  Food Insecurity:   . Worried About Charity fundraiser in the Last Year: Not on file  . Ran Out of Food in the Last Year: Not on file  Transportation Needs:   . Lack of Transportation (Medical): Not on file  . Lack of Transportation (Non-Medical): Not on file  Physical Activity:   . Days of Exercise per Week: Not on file  . Minutes of Exercise per Session: Not on file  Stress:   . Feeling of Stress : Not on file  Social Connections:   .  Frequency of Communication with Friends and Family: Not on file  . Frequency of Social Gatherings with Friends and Family: Not on file  . Attends Religious Services: Not on file  . Active Member of Clubs or Organizations: Not on file  . Attends Archivist Meetings: Not on file  . Marital Status: Not on file  Intimate Partner Violence:   . Fear of Current or Ex-Partner: Not on file  . Emotionally Abused: Not on file  . Physically Abused: Not on file  . Sexually Abused: Not on file     Constitutional: Denies fever, malaise, fatigue, headache or abrupt weight changes.  Neurological: Pt reports insomnia. Denies dizziness, difficulty with memory, difficulty with speech or problems with balance and coordination.  Psych: Pt reports stress,  anxiety. Denies depression, SI/HI.  No other specific complaints in a complete review of systems (except as listed in HPI above).    Observations/Objective:   Wt Readings from Last 3 Encounters:  05/04/19 170 lb 11.2 oz (77.4 kg)  02/16/19 167 lb (75.8 kg)  01/19/19 163 lb (73.9 kg)    Neurological: Alert and oriented.  Psych: Thought content normal.   BMET    Component Value Date/Time   NA 138 05/04/2019 2036   K 4.8 05/04/2019 2036   CL 101 05/04/2019 2036   CO2 29 05/04/2019 2036   GLUCOSE 85 05/04/2019 2036   BUN 14 05/04/2019 2036   CREATININE 1.15 05/04/2019 2036   CALCIUM 9.3 05/04/2019 2036   GFRNONAA >60 05/04/2019 2036   GFRAA >60 05/04/2019 2036    Lipid Panel     Component Value Date/Time   CHOL 201 (H) 06/16/2017 1425   TRIG (H) 06/16/2017 1425    476.0 Triglyceride is over 400; calculations on Lipids are invalid.   HDL 39.50 06/16/2017 1425   CHOLHDL 5 06/16/2017 1425   VLDL 45.2 (H) 05/03/2016 1002   LDLCALC 124 (H) 03/16/2015 0830    CBC    Component Value Date/Time   WBC 11.7 (H) 05/04/2019 2036   RBC 5.68 05/04/2019 2036   HGB 15.5 05/04/2019 2036   HCT 49.8 05/04/2019 2036   PLT 505 (H) 05/04/2019 2036   MCV 87.7 05/04/2019 2036   MCH 27.3 05/04/2019 2036   MCHC 31.1 05/04/2019 2036   RDW 13.6 05/04/2019 2036   LYMPHSABS 1.8 02/15/2018 1921   MONOABS 0.5 02/15/2018 1921   EOSABS 0.1 02/15/2018 1921   BASOSABS 0.0 02/15/2018 1921    Hgb A1C Lab Results  Component Value Date   HGBA1C 5.2 03/16/2015       Assessment and Plan:  Insomnia:  Ok to continue Melatinin RX for Trazadone 25-50 mg PO QHS prn- sedation caution given Discussed sleep routine, dark, quiet, slightly cool room to promote sleep  Return precautions discussed  Follow Up Instructions:    I discussed the assessment and treatment plan with the patient. The patient was provided an opportunity to ask questions and all were answered. The patient agreed with  the plan and demonstrated an understanding of the instructions.   The patient was advised to call back or seek an in-person evaluation if the symptoms worsen or if the condition fails to improve as anticipated.  I provided 5:47 minutes of non-face-to-face time during this encounter.   Webb Silversmith, NP

## 2019-09-02 ENCOUNTER — Other Ambulatory Visit: Payer: Self-pay

## 2019-09-02 ENCOUNTER — Ambulatory Visit: Payer: Self-pay

## 2019-09-02 ENCOUNTER — Other Ambulatory Visit: Payer: Self-pay | Admitting: Occupational Medicine

## 2019-09-02 DIAGNOSIS — M79642 Pain in left hand: Secondary | ICD-10-CM

## 2019-09-13 ENCOUNTER — Other Ambulatory Visit: Payer: Self-pay | Admitting: Internal Medicine

## 2019-09-14 NOTE — Telephone Encounter (Signed)
Last prescribed on 08/18/2019 by Webb Silversmith, NP. Last appointment on 08/18/2019. No future appointment

## 2019-09-14 NOTE — Telephone Encounter (Signed)
Needs office visit (can be virtual) for refills.  Have him schedule. Can do CPE in person if he prefers, he's due.

## 2019-09-15 NOTE — Telephone Encounter (Signed)
lvm asking pt to call office °

## 2019-09-15 NOTE — Telephone Encounter (Signed)
Ok to refill? Carl Moore had schedule patient for a phone visit on tomorrow 09/16/2019

## 2019-09-15 NOTE — Telephone Encounter (Signed)
Pt has been scheduled.  °

## 2019-09-16 ENCOUNTER — Other Ambulatory Visit: Payer: Self-pay

## 2019-09-16 ENCOUNTER — Ambulatory Visit: Payer: 59 | Admitting: Primary Care

## 2019-09-16 NOTE — Telephone Encounter (Signed)
We could not get a hold of patient during and after his scheduled appointment time 09/16/19. Will refuse refill until he's able to meet.

## 2019-09-21 ENCOUNTER — Ambulatory Visit (INDEPENDENT_AMBULATORY_CARE_PROVIDER_SITE_OTHER): Payer: 59 | Admitting: Primary Care

## 2019-09-21 ENCOUNTER — Encounter: Payer: Self-pay | Admitting: Primary Care

## 2019-09-21 ENCOUNTER — Other Ambulatory Visit: Payer: Self-pay

## 2019-09-21 VITALS — BP 137/80 | Temp 98.5°F | Wt 175.0 lb

## 2019-09-21 DIAGNOSIS — G4700 Insomnia, unspecified: Secondary | ICD-10-CM

## 2019-09-21 MED ORDER — TRAZODONE HCL 50 MG PO TABS: 50.0000 mg | ORAL_TABLET | Freq: Every evening | ORAL | 0 refills | Status: DC | PRN

## 2019-09-21 NOTE — Assessment & Plan Note (Addendum)
Chronic for the last year, improved with Trazodone 50 mg HS. Discussed healthy bedtime hygiene habits.  Refill sent to pharmacy.

## 2019-09-21 NOTE — Progress Notes (Signed)
Subjective:    Patient ID: Carl Moore, male    DOB: 09/02/1980, 39 y.o.   MRN: [id number]  HPI     DANILA CURETON - 39 y.o. male  MRN [id number]  Date of Birth: [birth year]  PCP: Pleas [name], NP  This service was provided via telemedicine. Phone Visit performed on 09/21/2019    Rationale for phone visit along with limitations reviewed. Patient consented to telephone encounter.    Location of patient: Home Location of provider: Office at [employer] @ [facility] Name of referring provider: N/A   Names of persons and role in encounter: Provider: Pleas [name], NP  Patient: [name]  Other: N/A   Time on call: 5 min - 26 sec   Subjective: Chief Complaint  Patient presents with  . Follow-up  . Medication Refill     HPI:  Mr. Giannotti is a 39 year old male with a history of seasonal allergies, hypertriglyceridemia, elevated blood pressure reading, insomnia who presents today for medication refill.  He is needing a refill of his Trazodone 50 mg that was initially prescribed by Webb Silversmith in mid January 2021. He has a chronic history of difficulty falling asleep for the last one year, "my brain never shuts down", will toss and turn throughout the night. He does wake during the night to urinate, will sometimes struggle to fall back asleep.  He denies daily anxiety, worry, nervous feeling during the day, only at night. He was prescribed Trazodone 50 mg during his last visit, has found this to be helpful for the most part. Prior to his last visit he had tried Melatonin 10 mg without much improvement.   He's been out of Trazodone since last week and hasn't slept well. He is requesting refills.    Objective/Observations:  No physical exam or vital signs collected unless specifically identified below.   BP 137/80   Temp 98.5 F (36.9 C) (Oral)   Wt 175 lb (79.4 kg)   BMI 28.25 kg/m    Respiratory status: speaks in complete sentences without  evident shortness of breath.   Assessment/Plan:  See problem based charting.   No problem-specific Assessment & Plan notes found for this encounter.   I discussed the assessment and treatment plan with the patient. The patient was provided an opportunity to ask questions and all were answered. The patient agreed with the plan and demonstrated an understanding of the instructions.  Lab Orders  No laboratory test(s) ordered today    No orders of the defined types were placed in this encounter.   The patient was advised to call back or seek an in-person evaluation if the symptoms worsen or if the condition fails to improve as anticipated.  Pleas [name], NP    Review of Systems  Neurological: Negative for headaches.  Psychiatric/Behavioral: Positive for sleep disturbance. Negative for suicidal ideas.       See HPI       Past Medical History:  Diagnosis Date  . Chicken pox      Social History   Socioeconomic History  . Marital status: Married    Spouse name: Not on file  . Number of children: Not on file  . Years of education: Not on file  . Highest education level: Not on file  Occupational History  . Not on file  Tobacco Use  . Smoking status: Never Smoker  . Smokeless tobacco: Never Used  Substance and Sexual Activity  . Alcohol use:  No    Alcohol/week: 0.0 standard drinks  . Drug use: No  . Sexual activity: Yes  Other Topics Concern  . 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   No children.   Enjoys playing golf.   Social Determinants of Health   Financial Resource Strain:   . Difficulty of Paying Living Expenses: Not on file  Food Insecurity:   . Worried About Charity fundraiser in the Last Year: Not on file  . Ran Out of Food in the Last Year: Not on file  Transportation Needs:   . Lack of Transportation (Medical): Not on file  . Lack of Transportation (Non-Medical): Not on file  Physical Activity:    . Days of Exercise per Week: Not on file  . Minutes of Exercise per Session: Not on file  Stress:   . Feeling of Stress : Not on file  Social Connections:   . Frequency of Communication with Friends and Family: Not on file  . Frequency of Social Gatherings with Friends and Family: Not on file  . Attends Religious Services: Not on file  . Active Member of Clubs or Organizations: Not on file  . Attends Archivist Meetings: Not on file  . Marital Status: Not on file  Intimate Partner Violence:   . Fear of Current or Ex-Partner: Not on file  . Emotionally Abused: Not on file  . Physically Abused: Not on file  . Sexually Abused: Not on file    Past Surgical History:  Procedure Laterality Date  . bone spur Left 02/2018   left big toe  . KNEE ARTHROSCOPY Left   . ULNAR NERVE REPAIR Left 1999    Family History  Problem Relation Age of Onset  . Hypertension Mother   . Stroke Father        Deceased  . Hypertension Father     No Known Allergies  Current Outpatient Medications on File Prior to Visit  Medication Sig Dispense Refill  . MELATONIN PO Take 1 tablet by mouth at bedtime as needed (sleep).    . traZODone (DESYREL) 50 MG tablet Take 0.5-1 tablets (25-50 mg total) by mouth at bedtime as needed for sleep. 30 tablet 0   No current facility-administered medications on file prior to visit.    BP 137/80   Temp 98.5 F (36.9 C) (Oral)   Wt 175 lb (79.4 kg)   BMI 28.25 kg/m    Objective:   Physical Exam  Constitutional: He is oriented to person, place, and time.  Respiratory: Effort normal.  Neurological: He is alert and oriented to person, place, and time.  Psychiatric: He has a normal mood and affect.           Assessment & Plan:

## 2019-09-21 NOTE — Patient Instructions (Signed)
Resume Trazodone 50 mg nightly for sleep.  It was a pleasure to see you today!

## 2019-11-04 ENCOUNTER — Ambulatory Visit: Payer: 59 | Admitting: Primary Care

## 2019-11-04 ENCOUNTER — Other Ambulatory Visit: Payer: Self-pay

## 2019-11-04 ENCOUNTER — Encounter: Payer: Self-pay | Admitting: Primary Care

## 2019-11-04 VITALS — BP 134/80 | HR 82 | Temp 96.7°F | Ht 66.0 in | Wt 168.0 lb

## 2019-11-04 DIAGNOSIS — J302 Other seasonal allergic rhinitis: Secondary | ICD-10-CM | POA: Diagnosis not present

## 2019-11-04 DIAGNOSIS — E781 Pure hyperglyceridemia: Secondary | ICD-10-CM

## 2019-11-04 DIAGNOSIS — Z0000 Encounter for general adult medical examination without abnormal findings: Secondary | ICD-10-CM

## 2019-11-04 DIAGNOSIS — G4700 Insomnia, unspecified: Secondary | ICD-10-CM

## 2019-11-04 DIAGNOSIS — R030 Elevated blood-pressure reading, without diagnosis of hypertension: Secondary | ICD-10-CM

## 2019-11-04 LAB — LIPID PANEL
Cholesterol: 197 mg/dL (ref 0–200)
HDL: 44.6 mg/dL (ref >39.00)
NonHDL: 152.12
Total CHOL/HDL Ratio: 4
Triglycerides: 231 mg/dL — ABNORMAL HIGH (ref 0.0–149.0)
VLDL: 46.2 mg/dL — ABNORMAL HIGH (ref 0.0–40.0)

## 2019-11-04 LAB — LDL CHOLESTEROL, DIRECT: Direct LDL: 120 mg/dL

## 2019-11-04 LAB — COMPREHENSIVE METABOLIC PANEL
ALT: 42 U/L (ref 0–53)
AST: 26 U/L (ref 0–37)
Albumin: 4.5 g/dL (ref 3.5–5.2)
Alkaline Phosphatase: 97 U/L (ref 39–117)
BUN: 17 mg/dL (ref 6–23)
CO2: 32 mEq/L (ref 19–32)
Calcium: 9.5 mg/dL (ref 8.4–10.5)
Chloride: 101 mEq/L (ref 96–112)
Creatinine, Ser: 1.06 mg/dL (ref 0.40–1.50)
GFR: 77.21 mL/min (ref >60.00)
Glucose, Bld: 91 mg/dL (ref 70–99)
Potassium: 4.7 mEq/L (ref 3.5–5.1)
Sodium: 140 mEq/L (ref 135–145)
Total Bilirubin: 0.5 mg/dL (ref 0.2–1.2)
Total Protein: 6.9 g/dL (ref 6.0–8.3)

## 2019-11-04 NOTE — Patient Instructions (Addendum)
Stop by the lab prior to leaving today. I will notify you of your results once received.   Nasal Congestion/Ear Pressure/Sinus Pressure: Try using Flonase (fluticasone) nasal spray. Instill 1 spray in each nostril twice daily.   Switch from Zyrtec to Xyzal. Get the off brand version.  Increase your Trazodone to 100 mg as discussed. Please update me in one week.   Continue exercising. You should be getting 150 minutes of moderate intensity exercise weekly.  Continue to work on a healthy diet. Ensure you are consuming 64 ounces of water daily.  It was a pleasure to see you today!   Preventive Care 41-52 Years Old, Male Preventive care refers to lifestyle choices and visits with your health care provider that can promote health and wellness. This includes:  A yearly physical exam. This is also called an annual well check.  Regular dental and eye exams.  Immunizations.  Screening for certain conditions.  Healthy lifestyle choices, such as eating a healthy diet, getting regular exercise, not using drugs or products that contain nicotine and tobacco, and limiting alcohol use. What can I expect for my preventive care visit? Physical exam Your health care provider will check:  Height and weight. These may be used to calculate body mass index (BMI), which is a measurement that tells if you are at a healthy weight.  Heart rate and blood pressure.  Your skin for abnormal spots. Counseling Your health care provider may ask you questions about:  Alcohol, tobacco, and drug use.  Emotional well-being.  Home and relationship well-being.  Sexual activity.  Eating habits.  Work and work Statistician. What immunizations do I need?  Influenza (flu) vaccine  This is recommended every year. Tetanus, diphtheria, and pertussis (Tdap) vaccine  You may need a Td booster every 10 years. Varicella (chickenpox) vaccine  You may need this vaccine if you have not already been  vaccinated. Zoster (shingles) vaccine  You may need this after age 41. Measles, mumps, and rubella (MMR) vaccine  You may need at least one dose of MMR if you were born in 1957 or later. You may also need a second dose. Pneumococcal conjugate (PCV13) vaccine  You may need this if you have certain conditions and were not previously vaccinated. Pneumococcal polysaccharide (PPSV23) vaccine  You may need one or two doses if you smoke cigarettes or if you have certain conditions. Meningococcal conjugate (MenACWY) vaccine  You may need this if you have certain conditions. Hepatitis A vaccine  You may need this if you have certain conditions or if you travel or work in places where you may be exposed to hepatitis A. Hepatitis B vaccine  You may need this if you have certain conditions or if you travel or work in places where you may be exposed to hepatitis B. Haemophilus influenzae type b (Hib) vaccine  You may need this if you have certain risk factors. Human papillomavirus (HPV) vaccine  If recommended by your health care provider, you may need three doses over 6 months. You may receive vaccines as individual doses or as more than one vaccine together in one shot (combination vaccines). Talk with your health care provider about the risks and benefits of combination vaccines. What tests do I need? Blood tests  Lipid and cholesterol levels. These may be checked every 5 years, or more frequently if you are over 21 years old.  Hepatitis C test.  Hepatitis B test. Screening  Lung cancer screening. You may have this screening every year  starting at age 41 if you have a 30-pack-year history of smoking and currently smoke or have quit within the past 15 years.  Prostate cancer screening. Recommendations will vary depending on your family history and other risks.  Colorectal cancer screening. All adults should have this screening starting at age 41 and continuing until age 41. Your  health care provider may recommend screening at age 41 if you are at increased risk. You will have tests every 1-10 years, depending on your results and the type of screening test.  Diabetes screening. This is done by checking your blood sugar (glucose) after you have not eaten for a while (fasting). You may have this done every 1-3 years.  Sexually transmitted disease (STD) testing. Follow these instructions at home: Eating and drinking  Eat a diet that includes fresh fruits and vegetables, whole grains, lean protein, and low-fat dairy products.  Take vitamin and mineral supplements as recommended by your health care provider.  Do not drink alcohol if your health care provider tells you not to drink.  If you drink alcohol: ? Limit how much you have to 0-2 drinks a day. ? Be aware of how much alcohol is in your drink. In the [place] one drink equals one 12 oz bottle of beer (355 mL), one 5 oz glass of wine (148 mL), or one 1 oz glass of hard liquor (44 mL). Lifestyle  Take daily care of your teeth and gums.  Stay active. Exercise for at least 30 minutes on 5 or more days each week.  Do not use any products that contain nicotine or tobacco, such as cigarettes, e-cigarettes, and chewing tobacco. If you need help quitting, ask your health care provider.  If you are sexually active, practice safe sex. Use a condom or other form of protection to prevent STIs (sexually transmitted infections).  Talk with your health care provider about taking a low-dose aspirin every day starting at age 69. What's next?  Go to your health care provider once a year for a well check visit.  Ask your health care provider how often you should have your eyes and teeth checked.  Stay up to date on all vaccines. This information is not intended to replace advice given to you by your health care provider. Make sure you discuss any questions you have with your health care provider. Document Revised: 07/09/2018  Document Reviewed: 07/09/2018 Elsevier Patient Education  2020 [employer].

## 2019-11-04 NOTE — Assessment & Plan Note (Signed)
Acute flare.  Switch to Xyzal. Add Flonase. He will update.

## 2019-11-04 NOTE — Assessment & Plan Note (Signed)
Some improvement with Trazodone, still struggling with waking during the night and cannot fall back asleep.  Increase dose to 100 mg, he has enough 50 mg tablets at home and will update in one week.

## 2019-11-04 NOTE — Progress Notes (Signed)
Subjective:    Patient ID: JUAN DORSAINVIL, male    DOB: 25-Aug-1980, 39 y.o.   MRN: [id number]  HPI  This visit occurred during the SARS-CoV-2 public health emergency.  Safety protocols were in place, including screening questions prior to the visit, additional usage of staff PPE, and extensive cleaning of exam room while observing appropriate contact time as indicated for disinfecting solutions.   Mr. Forrister is a 39 year old male who presents today for complete physical.  Three days ago he developed severe allergy symptoms, has had symptoms prior to three days ago but not this bad. Works outside all summer in Biomedical scientist. Symptoms include rhinorrhea, nasal congestion, post nasal drip, sneezing, itchy/watery eyes. He been taking Zyrtec without much improvement.   Immunizations: -Tetanus: Completed in 2015 -Influenza: Completed this season   Diet: He endorses a fair diet.  Exercise: He is active at work, exercises four times weekly  Eye exam: No recent exam Dental exam: Completes semi-annually   BP Readings from Last 3 Encounters:  11/04/19 134/80  09/21/19 137/80  05/05/19 (!) 149/94     Review of Systems  Constitutional: Negative for unexpected weight change.  HENT: Positive for congestion, postnasal drip, rhinorrhea and sneezing.   Eyes: Positive for itching.       Watery eyes  Respiratory: Negative for cough and shortness of breath.   Cardiovascular: Negative for chest pain.  Gastrointestinal: Negative for constipation and diarrhea.  Genitourinary: Negative for difficulty urinating.  Musculoskeletal: Negative for arthralgias and myalgias.  Skin: Negative for rash.  Allergic/Immunologic: Positive for environmental allergies.  Neurological: Negative for dizziness, numbness and headaches.  Psychiatric/Behavioral: The patient is not nervous/anxious.        Past Medical History:  Diagnosis Date  . Chicken pox      Social History   Socioeconomic History  .  Marital status: Married    Spouse name: Not on file  . Number of children: Not on file  . Years of education: Not on file  . Highest education level: Not on file  Occupational History  . Not on file  Tobacco Use  . Smoking status: Never Smoker  . Smokeless tobacco: Never Used  Substance and Sexual Activity  . Alcohol use: No    Alcohol/week: 0.0 standard drinks  . Drug use: No  . Sexual activity: Yes  Other Topics Concern  . 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   No children.   Enjoys playing golf.   Social Determinants of Health   Financial Resource Strain:   . Difficulty of Paying Living Expenses:   Food Insecurity:   . Worried About Charity fundraiser in the Last Year:   . Arboriculturist in the Last Year:   Transportation Needs:   . Film/video editor (Medical):   Marland Kitchen Lack of Transportation (Non-Medical):   Physical Activity:   . Days of Exercise per Week:   . Minutes of Exercise per Session:   Stress:   . Feeling of Stress :   Social Connections:   . Frequency of Communication with Friends and Family:   . Frequency of Social Gatherings with Friends and Family:   . Attends Religious Services:   . Active Member of Clubs or Organizations:   . Attends Archivist Meetings:   Marland Kitchen Marital Status:   Intimate Partner Violence:   . Fear of Current or Ex-Partner:   . Emotionally  Abused:   Marland Kitchen Physically Abused:   . Sexually Abused:     Past Surgical History:  Procedure Laterality Date  . bone spur Left 02/2018   left big toe  . KNEE ARTHROSCOPY Left   . ULNAR NERVE REPAIR Left 1999    Family History  Problem Relation Age of Onset  . Hypertension Mother   . Stroke Father        Deceased  . Hypertension Father     No Known Allergies  Current Outpatient Medications on File Prior to Visit  Medication Sig Dispense Refill  . MELATONIN PO Take 1 tablet by mouth at bedtime as needed (sleep).    .  traZODone (DESYREL) 50 MG tablet Take 1 tablet (50 mg total) by mouth at bedtime as needed for sleep. 90 tablet 0   No current facility-administered medications on file prior to visit.    BP 134/80   Pulse 82   Temp (!) 96.7 F (35.9 C) (Temporal)   Ht 5\' 6"  (1.676 m)   Wt 168 lb (76.2 kg)   SpO2 98%   BMI 27.12 kg/m    Objective:   Physical Exam  Constitutional: He is oriented to person, place, and time. He appears well-nourished.  HENT:  Right Ear: Tympanic membrane and ear canal normal.  Left Ear: Tympanic membrane and ear canal normal.  Mouth/Throat: Oropharynx is clear and moist.  Eyes: Pupils are equal, round, and reactive to light. EOM are normal.  Cardiovascular: Normal rate and regular rhythm.  Respiratory: Effort normal and breath sounds normal.  GI: Soft. Bowel sounds are normal. There is no abdominal tenderness.  Musculoskeletal:        General: Normal range of motion.     Cervical back: Neck supple.  Neurological: He is alert and oriented to person, place, and time. No cranial nerve deficit.  Reflex Scores:      Patellar reflexes are 2+ on the right side and 2+ on the left side. Skin: Skin is warm and dry.  Psychiatric: He has a normal mood and affect.           Assessment & Plan:

## 2019-11-04 NOTE — Assessment & Plan Note (Signed)
Tetanus UTD. Encouraged a healthy diet, regular exercise. Exam today unremarkable. Labs pending.

## 2019-11-04 NOTE — Assessment & Plan Note (Signed)
Commended him on regular exercise, encouraged a healthy diet. Repeat lipids pending.

## 2019-11-04 NOTE — Assessment & Plan Note (Signed)
Borderline today, continue to monitor.

## 2019-11-08 ENCOUNTER — Telehealth: Payer: Self-pay | Admitting: Primary Care

## 2019-11-08 DIAGNOSIS — G4700 Insomnia, unspecified: Secondary | ICD-10-CM

## 2019-11-08 MED ORDER — TRAZODONE HCL 100 MG PO TABS: 100.0000 mg | ORAL_TABLET | Freq: Every day | ORAL | 1 refills | Status: DC

## 2019-11-08 NOTE — Telephone Encounter (Signed)
Noted, new Rx sent to pharmacy. 

## 2019-11-08 NOTE — Telephone Encounter (Signed)
Patient called today requesting a new script for the TraZODone. He stated that he has been taking 100mg  like instructed and it is work much better than the 50mg . He was advised to call back if he wanted the new script sent  [place]

## 2019-12-11 ENCOUNTER — Encounter (HOSPITAL_COMMUNITY): Payer: Self-pay | Admitting: Emergency Medicine

## 2019-12-11 ENCOUNTER — Emergency Department (HOSPITAL_COMMUNITY)
Admission: EM | Admit: 2019-12-11 | Discharge: 2019-12-11 | Disposition: A | Discharge status: Home / Self Care | Payer: 59 | Attending: Emergency Medicine | Admitting: Emergency Medicine

## 2019-12-11 ENCOUNTER — Other Ambulatory Visit: Payer: Self-pay

## 2019-12-11 ENCOUNTER — Emergency Department (HOSPITAL_COMMUNITY): Payer: 59

## 2019-12-11 DIAGNOSIS — M79674 Pain in right toe(s): Secondary | ICD-10-CM | POA: Diagnosis present

## 2019-12-11 DIAGNOSIS — M2021 Hallux rigidus, right foot: Secondary | ICD-10-CM

## 2019-12-11 MED ORDER — ACETAMINOPHEN 325 MG PO TABS
650.0000 mg | ORAL_TABLET | Freq: Once | ORAL | Status: AC
  Administered 2019-12-11: 650 mg via ORAL
  Filled 2019-12-11: qty 2

## 2019-12-11 MED ORDER — HYDROCODONE-ACETAMINOPHEN 5-325 MG PO TABS: 1.0000 | ORAL_TABLET | Freq: Four times a day (QID) | ORAL | 0 refills | Status: DC | PRN

## 2019-12-11 MED ORDER — NAPROXEN 500 MG PO TABS
500.0000 mg | ORAL_TABLET | Freq: Once | ORAL | Status: AC
  Administered 2019-12-11: 500 mg via ORAL
  Filled 2019-12-11: qty 1

## 2019-12-11 NOTE — ED Provider Notes (Signed)
39yo male with right great MTP pain. Awaiting XR.  Physical Exam  BP (!) 180/118   Pulse 91   Temp 98.1 F (36.7 C)   Resp (!) 21   SpO2 99%   Physical Exam  ED Course/Procedures     Procedures  MDM  X-ray findings consistent with hallux rigidus.  Patient was given prescription for Norco, recommend he call his orthopedic surgeon.       Tacy Learn, PA-C 12/11/19 2139    Lacretia Leigh, MD 12/12/19 1212

## 2019-12-11 NOTE — Discharge Instructions (Addendum)
Per our discussion, I am prescribing you a short course of norco for breakthrough pain. This is a strong narcotic and can have a constipating effect so please stay adequately hydrated and please eat adequate fiber. Do not mix this medication with alcohol or take it prior to driving.   I would additionally recommend you take naproxen or ibuprofen for your pain throughout the day. Please follow the instructions on the bottle.  Please follow up with Dr. Doran Durand regarding your symptoms first thing on Monday morning. If your symptoms worsen please feel free to return to the emergency department for reevaluation.   It was a pleasure to meet you.

## 2019-12-11 NOTE — ED Provider Notes (Signed)
[facility] DEPT Provider Note   CSN: [id number] Arrival date & time: 12/11/19  1924     History Chief Complaint  Patient presents with  . Foot Pain    JAYDA BANTHER is a 39 y.o. male.  HPI HPI Comments: CRUSE HEINZERLING is a 39 y.o. male who presents to the Emergency Department complaining of right foot pain.  Patient states last night he was sitting on his couch and began experiencing worsening pain along the right first MTP.  For the last 24 hours he notes associated worsening swelling in the region.  His pain worsens with palpation or any movement of the first or second toes of the right foot.  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  This was surgically corrected by Dr. Wylene Simmer at [facility].  He performed left hallux metatarsal phalangeal joint cheilectomy and joint resurfacing.  He believes that he is having similar symptoms in his right foot.  No numbness, tingling, weakness.    Past Medical History:  Diagnosis Date  . Chicken pox     Patient Active Problem List   Diagnosis Date Noted  . Insomnia 09/21/2019  . Elevated blood pressure reading 08/27/2018  . Hypertriglyceridemia 05/10/2016  . Preventative health care 03/24/2015  . Seasonal allergies 12/19/2014    Past Surgical History:  Procedure Laterality Date  . bone spur Left 02/2018   left big toe  . KNEE ARTHROSCOPY Left   . ULNAR NERVE REPAIR Left 1999       Family History  Problem Relation Age of Onset  . Hypertension Mother   . Stroke Father        Deceased  . Hypertension Father     Social History   Tobacco Use  . Smoking status: Never Smoker  . Smokeless tobacco: Never Used  Substance Use Topics  . Alcohol use: No    Alcohol/week: 0.0 standard drinks  . Drug use: No    Home Medications Prior to Admission medications   Medication Sig Start Date End Date Taking? Authorizing Provider  MELATONIN PO Take 1 tablet by mouth  at bedtime as needed (sleep).    [provider]  traZODone (DESYREL) 100 MG tablet Take 1 tablet (100 mg total) by mouth at bedtime. For sleep. 11/08/19   Pleas [name], NP    Allergies    Patient has no known allergies.  Review of Systems   Review of Systems  Constitutional: Negative for chills and fever.  Musculoskeletal: Positive for arthralgias, joint swelling and myalgias.  Neurological: Negative for weakness and numbness.    Physical Exam Updated Vital Signs BP (!) 180/118   Pulse 91   Temp 98.1 F (36.7 C)   Resp (!) 21   SpO2 99%   Physical Exam Vitals and nursing note reviewed.  Constitutional:      General: He is in acute distress.     Appearance: Normal appearance. He is normal weight. He is not ill-appearing, toxic-appearing or diaphoretic.  HENT:     Head: Normocephalic and atraumatic.     Right Ear: External ear normal.     Left Ear: External ear normal.     Nose: Nose normal.     Mouth/Throat:     Pharynx: Oropharynx is clear.  Eyes:     Extraocular Movements: Extraocular movements intact.  Cardiovascular:     Rate and Rhythm: Normal rate.     Pulses: Normal pulses.  Pulmonary:  Effort: Pulmonary effort is normal.  Abdominal:     General: Abdomen is flat.  Musculoskeletal:        General: Swelling and tenderness present.     Cervical back: Normal range of motion.     Comments: Moderate edema noted along the first MTP of the right foot.  Exquisite TTP overlying the site.  Patient is able to flex and extend the toes of the right foot but cannot completely flex and extend the right great toe.  Good capillary refill noted in all the toes of the right foot.  Distal sensation intact.  No erythema or increased warmth noted in the region.  Skin:    General: Skin is warm and dry.     Capillary Refill: Capillary refill takes less than 2 seconds.  Neurological:     General: No focal deficit present.     Mental Status: He is alert and oriented  to person, place, and time.  Psychiatric:        Mood and Affect: Mood normal.        Behavior: Behavior normal.    ED Results / Procedures / Treatments   Labs (all labs ordered are listed, but only abnormal results are displayed) Labs Reviewed - No data to display  EKG None  Radiology No results found.  Procedures Procedures   Medications Ordered in ED Medications  naproxen (NAPROSYN) tablet 500 mg (has no administration in time range)  acetaminophen (TYLENOL) tablet 650 mg (has no administration in time range)    ED Course  I have reviewed the triage vital signs and the nursing notes.  Pertinent labs & imaging results that were available during my care of the patient were reviewed by me and considered in my medical decision making (see chart for details).    MDM Rules/Calculators/A&P                      8:29 PM patient is a 39 year old male who presents with atraumatic right first MTP pain and swelling.  The joint is nonerythematous and is not warm to the touch.  I am considering possible gout but patient also has a history of hallux rigidus in the left foot.  He states his prior symptoms in the left foot are consistent with those of the right.  This was operated on by Dr. Wylene Simmer at [facility].  Will obtain imaging of the right foot.  Naproxen and Tylenol for pain.  Will reassess.  9:00 PM It is the end of my shift and patient care is being transferred to my colleague PA-C Percell Miller. If x-ray is negative, will discharge patient with a short course of medication for pain management as well as orthopaedic follow up with Dr. Doran Durand.    Note: Portions of this report may have been transcribed using voice recognition software. Every effort was made to ensure accuracy; however, inadvertent computerized transcription errors may be present.     Final Clinical Impression(s) / ED Diagnoses Final diagnoses:  Great toe pain, right   Rx / DC Orders ED Discharge Orders          Ordered    HYDROcodone-acetaminophen (NORCO/VICODIN) 5-325 MG tablet  Every 6 hours PRN     12/11/19 2114           Rayna Sexton, PA-C 12/11/19 2117    Lacretia Leigh, MD 12/12/19 1214

## 2019-12-11 NOTE — ED Triage Notes (Signed)
Patient c/o pain to right foot since waking this morning. Reports hx bone spur. Denies injury.

## 2019-12-24 ENCOUNTER — Telehealth (INDEPENDENT_AMBULATORY_CARE_PROVIDER_SITE_OTHER): Payer: 59 | Admitting: Primary Care

## 2019-12-24 DIAGNOSIS — M25512 Pain in left shoulder: Secondary | ICD-10-CM | POA: Diagnosis not present

## 2019-12-24 DIAGNOSIS — G8929 Other chronic pain: Secondary | ICD-10-CM | POA: Diagnosis not present

## 2019-12-24 MED ORDER — CYCLOBENZAPRINE HCL 10 MG PO TABS: 10.0000 mg | ORAL_TABLET | Freq: Every evening | ORAL | 0 refills | Status: DC | PRN

## 2019-12-24 NOTE — Progress Notes (Signed)
Subjective:    Patient ID: Carl Moore, male    DOB: Aug 15, 1980, 39 y.o.   MRN: [id number]  HPI     Carl Moore - 39 y.o. male  MRN [id number]  Date of Birth: Nov 10, 1980  PCP: Pleas [name], NP  This service was provided via telemedicine. Phone Visit performed on 12/24/2019    Rationale for phone visit along with limitations reviewed. I discussed the limitations, risks, security and privacy concerns of performing a phone visit and the availability of in person appointments. I also discussed with the patient that there may be a patient responsible charge related to this service. Patient consented to telephone encounter.    Location of patient: Home Location of provider: Office at [employer] @ [facility] Name of referring provider: N/A   Names of persons and role in encounter: Provider: Pleas [name], NP  Patient: Carl Moore  Other: N/A   Time on call: 7 min - 8 sec   Subjective: No chief complaint on file.    HPI:  [name] is a 39 year old male with a history of seasonal allergies, insomnia, elevated blood pressure reading who presents today with a chief complaint of muscle spasm.  His spasm is located to the posterior left shoulder. He was once managed on Flexeril in the past for which he took intermittently. 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 He underwent cortisone injections and PRN Flexeril for a period of time with improvement.   He denies recent injury/trauma, over exertion. He is an Development worker, international aid for [employer], active during the day running chainsaws, trimming bushes, etc. The pain and spasm will bother him at night and keep him from sleeping.     Objective/Observations:  No physical exam or vital signs collected unless specifically identified below.   There were no vitals taken for this visit.   Respiratory status: speaks in complete sentences without evident shortness  of breath.   Assessment/Plan:  Acute on chronic symptoms to left shoulder, history of rotator cuff tear. Must be a minor tear given that he had improvement with cortisone injections and Flexeril.  Given absence of weakness and numbness I do not feel we need to urgently refer him back to orthopedics. Agreed to provide Flexeril dose but he may benefit from a cortisone injection.  Recommended he set up a visit with sports medicine. Rx for Flexeril sent to pharmacy, drowsiness precautions provided.   No problem-specific Assessment & Plan notes found for this encounter.   I discussed the assessment and treatment plan with the patient. The patient was provided an opportunity to ask questions and all were answered. The patient agreed with the plan and demonstrated an understanding of the instructions.  Lab Orders  No laboratory test(s) ordered today    No orders of the defined types were placed in this encounter.   The patient was advised to call back or seek an in-person evaluation if the symptoms worsen or if the condition fails to improve as anticipated.  Pleas [name], NP    Review of Systems  Musculoskeletal: Positive for arthralgias and myalgias. Negative for neck pain.  Skin: Negative for color change.  Neurological: Negative for weakness and numbness.       Past Medical History:  Diagnosis Date  . Chicken pox      Social History   Socioeconomic History  . Marital status: Married    Spouse name:  Not on file  . Number of children: Not on file  . Years of education: Not on file  . Highest education level: Not on file  Occupational History  . Not on file  Tobacco Use  . Smoking status: Never Smoker  . Smokeless tobacco: Never Used  Substance and Sexual Activity  . Alcohol use: No    Alcohol/week: 0.0 standard drinks  . Drug use: No  . Sexual activity: Yes  Other Topics Concern  . Not on file  Social History Narrative   Works as Development worker, international aid.   Married.  Newly wed.   No children.   Enjoys playing golf.   Social Determinants of Health   Financial Resource Strain:   . Difficulty of Paying Living Expenses:   Food Insecurity:   . Worried About Charity fundraiser in the Last Year:   . Arboriculturist in the Last Year:   Transportation Needs:   . Film/video editor (Medical):   Marland Kitchen Lack of Transportation (Non-Medical):   Physical Activity:   . Days of Exercise per Week:   . Minutes of Exercise per Session:   Stress:   . Feeling of Stress :   Social Connections:   . Frequency of Communication with Friends and Family:   . Frequency of Social Gatherings with Friends and Family:   . Attends Religious Services:   . Active Member of Clubs or Organizations:   . Attends Archivist Meetings:   Marland Kitchen Marital Status:   Intimate Partner Violence:   . Fear of Current or Ex-Partner:   . Emotionally Abused:   Marland Kitchen Physically Abused:   . Sexually Abused:     Past Surgical History:  Procedure Laterality Date  . bone spur Left 02/2018   left big toe  . KNEE ARTHROSCOPY Left   . ULNAR NERVE REPAIR Left 1999    Family History  Problem Relation Age of Onset  . Hypertension Mother   . Stroke Father        Deceased  . Hypertension Father     No Known Allergies  Current Outpatient Medications on File Prior to Visit  Medication Sig Dispense Refill  . MELATONIN PO Take 1 tablet by mouth at bedtime as needed (sleep).    . traZODone (DESYREL) 100 MG tablet Take 1 tablet (100 mg total) by mouth at bedtime. For sleep. 90 tablet 1   No current facility-administered medications on file prior to visit.    There were no vitals taken for this visit.   Objective:   Physical Exam  Constitutional: He is oriented to person, place, and time.  Respiratory: Effort normal.  Neurological: He is alert and oriented to person, place, and time.  Psychiatric: He has a normal mood and affect.           Assessment & Plan:

## 2019-12-24 NOTE — Patient Instructions (Signed)
You may take the cyclobenzaprine (Flexeril) at bedtime as needed for muscle spasms.  Schedule a visit with Dr. Lorelei Pont as discussed.  It was a pleasure to see you today!

## 2019-12-24 NOTE — Assessment & Plan Note (Signed)
Acute on chronic symptoms to left shoulder, history of rotator cuff tear. Must be a minor tear given that he had improvement with cortisone injections and Flexeril.  Given absence of weakness and numbness I do not feel we need to urgently refer him back to orthopedics. Agreed to provide Flexeril dose but he may benefit from a cortisone injection.  Recommended he set up a visit with sports medicine. Rx for Flexeril sent to pharmacy, drowsiness precautions provided.

## 2019-12-30 ENCOUNTER — Ambulatory Visit: Payer: 59 | Admitting: Family Medicine

## 2019-12-30 DIAGNOSIS — Z0289 Encounter for other administrative examinations: Secondary | ICD-10-CM

## 2020-01-20 ENCOUNTER — Ambulatory Visit: Payer: 59 | Admitting: Family Medicine

## 2020-01-20 DIAGNOSIS — Z0289 Encounter for other administrative examinations: Secondary | ICD-10-CM

## 2020-01-24 ENCOUNTER — Encounter: Payer: Self-pay | Admitting: Primary Care

## 2020-01-24 ENCOUNTER — Ambulatory Visit
Admission: RE | Admit: 2020-01-24 | Discharge: 2020-01-24 | Disposition: A | Discharge status: Home / Self Care | Payer: 59 | Source: Ambulatory Visit | Attending: Primary Care | Admitting: Primary Care

## 2020-01-24 ENCOUNTER — Ambulatory Visit (INDEPENDENT_AMBULATORY_CARE_PROVIDER_SITE_OTHER): Payer: 59 | Admitting: Primary Care

## 2020-01-24 ENCOUNTER — Other Ambulatory Visit: Payer: Self-pay

## 2020-01-24 DIAGNOSIS — H6120 Impacted cerumen, unspecified ear: Secondary | ICD-10-CM

## 2020-01-24 DIAGNOSIS — H6123 Impacted cerumen, bilateral: Secondary | ICD-10-CM | POA: Diagnosis not present

## 2020-01-24 DIAGNOSIS — R1031 Right lower quadrant pain: Secondary | ICD-10-CM | POA: Diagnosis not present

## 2020-01-24 DIAGNOSIS — R1011 Right upper quadrant pain: Secondary | ICD-10-CM | POA: Insufficient documentation

## 2020-01-24 HISTORY — DX: Diverticulosis of intestine, part unspecified, without perforation or abscess without bleeding: K57.90

## 2020-01-24 HISTORY — DX: Impacted cerumen, unspecified ear: H61.20

## 2020-01-24 HISTORY — DX: Fatty (change of) liver, not elsewhere classified: K76.0

## 2020-01-24 HISTORY — DX: Right upper quadrant pain: R10.11

## 2020-01-24 LAB — COMPREHENSIVE METABOLIC PANEL
ALT: 18 U/L (ref 0–53)
AST: 16 U/L (ref 0–37)
Albumin: 4.2 g/dL (ref 3.5–5.2)
Alkaline Phosphatase: 99 U/L (ref 39–117)
BUN: 15 mg/dL (ref 6–23)
CO2: 30 mEq/L (ref 19–32)
Calcium: 9.1 mg/dL (ref 8.4–10.5)
Chloride: 100 mEq/L (ref 96–112)
Creatinine, Ser: 1.21 mg/dL (ref 0.40–1.50)
GFR: 66.2 mL/min (ref >60.00)
Glucose, Bld: 113 mg/dL — ABNORMAL HIGH (ref 70–99)
Potassium: 4.1 mEq/L (ref 3.5–5.1)
Sodium: 136 mEq/L (ref 135–145)
Total Bilirubin: 0.4 mg/dL (ref 0.2–1.2)
Total Protein: 6.4 g/dL (ref 6.0–8.3)

## 2020-01-24 LAB — CBC WITH DIFFERENTIAL/PLATELET
Basophils Absolute: 0 10*3/uL (ref 0.0–0.1)
Basophils Relative: 0.5 % (ref 0.0–3.0)
Eosinophils Absolute: 0.2 10*3/uL (ref 0.0–0.7)
Eosinophils Relative: 2.5 % (ref 0.0–5.0)
HCT: 47.7 % (ref 39.0–52.0)
Hemoglobin: 16.2 g/dL (ref 13.0–17.0)
Lymphocytes Relative: 18 % (ref 12.0–46.0)
Lymphs Abs: 1.3 10*3/uL (ref 0.7–4.0)
MCHC: 34 g/dL (ref 30.0–36.0)
MCV: 87.9 fl (ref 78.0–100.0)
Monocytes Absolute: 0.5 10*3/uL (ref 0.1–1.0)
Monocytes Relative: 7.4 % (ref 3.0–12.0)
Neutro Abs: 5.4 10*3/uL (ref 1.4–7.7)
Neutrophils Relative %: 71.6 % (ref 43.0–77.0)
Platelets: 382 10*3/uL (ref 150.0–400.0)
RBC: 5.43 Mil/uL (ref 4.22–5.81)
RDW: 13.8 % (ref 11.5–15.5)
WBC: 7.5 10*3/uL (ref 4.0–10.5)

## 2020-01-24 MED ORDER — IOHEXOL 300 MG/ML  SOLN
100.0000 mL | Freq: Once | INTRAMUSCULAR | Status: AC | PRN
  Administered 2020-01-24: 100 mL via INTRAVENOUS

## 2020-01-24 NOTE — Assessment & Plan Note (Signed)
Acute for the last 8-12 hours, progressing. Tenderness on exam, now with nausea. He appears fatigued and ill.  Given symptoms, coupled with exam and presentation, we will obtain a stat CT abdomen/pelvis and stat labs to rule out acute appendicitis.   Orders placed, hospital precautions provided.

## 2020-01-24 NOTE — Patient Instructions (Addendum)
Stop by the lab prior to leaving today. I will notify you of your results once received.   Stop by the front desk and speak with either Rosaria Ferries or Charmaine regarding your CT scan.  Do not eat or drink anything.  Try using Debrox ear wax drops for removal in the right ear.   It was a pleasure to see you today!

## 2020-01-24 NOTE — Progress Notes (Signed)
Subjective:    Patient ID: Carl Moore, male    DOB: 1981-05-28, 39 y.o.   MRN: [id number]  HPI  This visit occurred during the SARS-CoV-2 public health emergency.  Safety protocols were in place, including screening questions prior to the visit, additional usage of staff PPE, and extensive cleaning of exam room while observing appropriate contact time as indicated for disinfecting solutions.   Carl Moore is a 39 year old male with a history of seasonal allergies who presents today with a chief complaint of several symptoms.   He was working outdoors to about 10 hours two days ago. After his shift he began feeling fatigued, decrease in appetite, and left ear fullness. This morning he started to notice RLQ abdominal pain that has gradually progressed this afternoon. He describes his pain as stabbing, worse with movement. He's also started to notice nausea this afternoon, denies fevers, vomiting, diarrhea.    Review of Systems  Constitutional: Positive for chills and fatigue. Negative for fever.  Gastrointestinal: Positive for abdominal pain and nausea. Negative for diarrhea and vomiting.  Skin: Negative for color change.       Past Medical History:  Diagnosis Date  . Chicken pox      Social History   Socioeconomic History  . Marital status: Married    Spouse name: Not on file  . Number of children: Not on file  . Years of education: Not on file  . Highest education level: Not on file  Occupational History  . Not on file  Tobacco Use  . Smoking status: Never Smoker  . Smokeless tobacco: Never Used  Vaping Use  . Vaping Use: Never used  Substance and Sexual Activity  . Alcohol use: No    Alcohol/week: 0.0 standard drinks  . Drug use: No  . Sexual activity: Yes  Other Topics Concern  . 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   No children.   Enjoys playing golf.   Social Determinants of Health   Financial  Resource Strain:   . Difficulty of Paying Living Expenses:   Food Insecurity:   . Worried About Charity fundraiser in the Last Year:   . Arboriculturist in the Last Year:   Transportation Needs:   . Film/video editor (Medical):   Marland Kitchen Lack of Transportation (Non-Medical):   Physical Activity:   . Days of Exercise per Week:   . Minutes of Exercise per Session:   Stress:   . Feeling of Stress :   Social Connections:   . Frequency of Communication with Friends and Family:   . Frequency of Social Gatherings with Friends and Family:   . Attends Religious Services:   . Active Member of Clubs or Organizations:   . Attends Archivist Meetings:   Marland Kitchen Marital Status:   Intimate Partner Violence:   . Fear of Current or Ex-Partner:   . Emotionally Abused:   Marland Kitchen Physically Abused:   . Sexually Abused:     Past Surgical History:  Procedure Laterality Date  . bone spur Left 02/2018   left big toe  . KNEE ARTHROSCOPY Left   . ULNAR NERVE REPAIR Left 1999    Family History  Problem Relation Age of Onset  . Hypertension Mother   . Stroke Father        Deceased  . Hypertension Father     No Known Allergies  Current  Outpatient Medications on File Prior to Visit  Medication Sig Dispense Refill  . traZODone (DESYREL) 100 MG tablet Take 1 tablet (100 mg total) by mouth at bedtime. For sleep. 90 tablet 1   No current facility-administered medications on file prior to visit.    BP 132/78   Pulse 87   Temp 97.7 F (36.5 C)   Ht 5\' 6"  (1.676 m)   Wt 168 lb (76.2 kg)   SpO2 97%   BMI 27.12 kg/m    Objective:   Physical Exam Constitutional:      General: He is not in acute distress.    Appearance: He is ill-appearing.  Cardiovascular:     Rate and Rhythm: Normal rate and regular rhythm.  Pulmonary:     Effort: Pulmonary effort is normal.     Breath sounds: Normal breath sounds.  Abdominal:     General: Abdomen is flat.     Palpations: Abdomen is soft.      Tenderness: There is abdominal tenderness in the right lower quadrant. There is guarding.  Musculoskeletal:     Cervical back: Neck supple.  Skin:    General: Skin is warm and dry.            Assessment & Plan:

## 2020-01-24 NOTE — Assessment & Plan Note (Addendum)
Acute ear fullness. Bilateral cerumen impaction noted.  He agreed to removal with instrumentation.  Left side completely removed, canal and TM unremarkable. Right side with residual cerumen unable to be removed. He tolerated well.  Discussed use of Debrox drops.

## 2020-02-08 NOTE — Progress Notes (Signed)
Carl Moore T. Carl Nodarse, MD, Carl Moore  Primary Care and [place] at [facility] [place], [address]  Phone: [phone]  FAX: [place] - 39 y.o. male  MRN [id number]  Date of Birth: 1981/06/13  Date: 02/10/2020  PCP: Carl [name], NP  Referral: Carl [name], NP  Chief Complaint  Patient presents with  . Shoulder Pain    Right-Injection    This visit occurred during the SARS-CoV-2 public health emergency.  Safety protocols were in place, including screening questions prior to the visit, additional usage of staff PPE, and extensive cleaning of exam room while observing appropriate contact time as indicated for disinfecting solutions.   Subjective:   Carl Moore is a 39 y.o. very pleasant male patient with Body mass index is 27.2 kg/m. who presents with the following:  He is here today in consultation.  It looks like in the past he is seen Carl Moore, Carl Moore, Carl Moore, [id number] Carl Moore.  Old baseball injury.  Plays some golf.    As a younger man, he was an avid baseball player and he did have an old injury and partial-thickness rotator cuff tear.  At this point he is not doing any throwing movements but he does work out at [employer] with [employer].  He has noted that sometimes when he is doing chest press maneuvers that he does have some pain in his shoulder, but this can be improved with positional change.  He also has some intermittent neck spasms and shoulder spasms, and is taken Flexeril in the past with some benefit.  Review of Systems is noted in the HPI, as appropriate   Objective:   BP 132/70   Pulse 75   Temp 98.2 F (36.8 C) (Temporal)   Ht 5\' 6"  (1.676 m)   Wt 168 lb 8 oz (76.4 kg)   SpO2 98%   BMI 27.20 kg/m    GEN: No acute distress; alert,appropriate. PULM: Breathing comfortably in no respiratory distress PSYCH:  Normally interactive.   Shoulder: R Inspection: No muscle wasting or winging Ecchymosis/edema: neg  AC joint, scapula, clavicle: Mildly tender to palpation at the [facility] joint Cervical spine: NT, full ROM Spurling's: neg Abduction: full, 5/5 Flexion: full, 5/5 IR, full, lift-off: 5/5 ER at neutral: full, 5/5 AC crossover and compression: neg Neer: Mild positive Hawkins: neg Drop Test: neg Empty Can: neg Supraspinatus insertion: NT Bicipital groove: NT Speed's: Mild positive Yergason's: neg Sulcus sign: neg Scapular dyskinesis: none  Some pain with rotational movement at the shoulder  C5-T1 intact Sensation intact Grip 5/5   Radiology:   Assessment and Plan:     ICD-10-CM   1. Chronic right shoulder pain  M25.511    G89.29   2. Muscle spasms of neck  M62.838    Ongoing right-sided intermittent shoulder pain with some arthropathy, pain at the [facility] joint, pain in the biceps tendon sheath.  Prior rotator cuff tear.  I think it is not unreasonable to do an intra-articular injection today.  His rotator cuff provocation is is minimally active right now.  I also think it is reasonable for him to have some Flexeril to use on a as needed basis.  Intraarticular Shoulder Aspiration/Injection Procedure Note Carl Moore [birth year] Date of procedure: 02/10/2020  Procedure: Large Joint Aspiration / Injection of Shoulder, Intraarticular, R Indications: Pain  Procedure Details Verbal consent was obtained from the  patient. Risks including infection explained and contrasted with benefits and alternatives. Patient prepped with Chloraprep and Ethyl Chloride used for anesthesia. An intraarticular shoulder injection was performed using the posterior approach; needle placed into joint capsule without difficulty. The patient tolerated the procedure well and had decreased pain post injection. No complications. Injection: 8 cc of Lidocaine 1% and 2 mL Depo-Medrol 40 mg. Needle: 22 gauge, 2  1/2 inch spinal needle  Follow-up: No follow-ups on file.  Meds ordered this encounter  Medications  . cyclobenzaprine (FLEXERIL) 10 MG tablet    Sig: Take 0.5-1 tablets (5-10 mg total) by mouth at bedtime as needed for muscle spasms.    Dispense:  30 tablet    Refill:  2   There are no discontinued medications. No orders of the defined types were placed in this encounter.   Signed,  Carl Deed. Lynden Carrithers, MD   Outpatient Encounter Medications as of 02/10/2020  Medication Sig  . traZODone (DESYREL) 100 MG tablet Take 1 tablet (100 mg total) by mouth at bedtime. For sleep.  . cyclobenzaprine (FLEXERIL) 10 MG tablet Take 0.5-1 tablets (5-10 mg total) by mouth at bedtime as needed for muscle spasms.   No facility-administered encounter medications on file as of 02/10/2020.

## 2020-02-10 ENCOUNTER — Encounter: Payer: Self-pay | Admitting: Family Medicine

## 2020-02-10 ENCOUNTER — Other Ambulatory Visit: Payer: Self-pay

## 2020-02-10 ENCOUNTER — Ambulatory Visit: Payer: 59 | Admitting: Family Medicine

## 2020-02-10 VITALS — BP 132/70 | HR 75 | Temp 98.2°F | Ht 66.0 in | Wt 168.5 lb

## 2020-02-10 DIAGNOSIS — M25511 Pain in right shoulder: Secondary | ICD-10-CM | POA: Diagnosis not present

## 2020-02-10 DIAGNOSIS — G8929 Other chronic pain: Secondary | ICD-10-CM

## 2020-02-10 DIAGNOSIS — M62838 Other muscle spasm: Secondary | ICD-10-CM

## 2020-02-10 MED ORDER — CYCLOBENZAPRINE HCL 10 MG PO TABS: 5.0000 mg | ORAL_TABLET | Freq: Every evening | ORAL | 2 refills | Status: DC | PRN

## 2020-02-10 MED ORDER — METHYLPREDNISOLONE ACETATE 40 MG/ML IJ SUSP
80.0000 mg | Freq: Once | INTRAMUSCULAR | Status: AC
  Administered 2020-02-10: 80 mg via INTRA_ARTICULAR

## 2020-02-10 NOTE — Addendum Note (Signed)
Addended by: Carter Kitten on: 02/10/2020 09:40 AM   Modules accepted: Orders

## 2020-02-28 ENCOUNTER — Emergency Department (HOSPITAL_COMMUNITY)
Admission: EM | Admit: 2020-02-28 | Discharge: 2020-02-28 | Disposition: A | Discharge status: Home / Self Care | Payer: 59 | Attending: Emergency Medicine | Admitting: Emergency Medicine

## 2020-02-28 ENCOUNTER — Telehealth: Payer: Self-pay

## 2020-02-28 ENCOUNTER — Other Ambulatory Visit: Payer: Self-pay

## 2020-02-28 ENCOUNTER — Encounter (HOSPITAL_COMMUNITY): Payer: Self-pay | Admitting: Emergency Medicine

## 2020-02-28 DIAGNOSIS — Z5321 Procedure and treatment not carried out due to patient leaving prior to being seen by health care provider: Secondary | ICD-10-CM | POA: Insufficient documentation

## 2020-02-28 DIAGNOSIS — R110 Nausea: Secondary | ICD-10-CM | POA: Diagnosis not present

## 2020-02-28 DIAGNOSIS — R109 Unspecified abdominal pain: Secondary | ICD-10-CM | POA: Diagnosis present

## 2020-02-28 DIAGNOSIS — R1031 Right lower quadrant pain: Secondary | ICD-10-CM | POA: Diagnosis not present

## 2020-02-28 LAB — COMPREHENSIVE METABOLIC PANEL
ALT: 25 U/L (ref 0–44)
AST: 17 U/L (ref 15–41)
Albumin: 3.9 g/dL (ref 3.5–5.0)
Alkaline Phosphatase: 88 U/L (ref 38–126)
Anion gap: 11 (ref 5–15)
BUN: 12 mg/dL (ref 6–20)
CO2: 25 mmol/L (ref 22–32)
Calcium: 9.2 mg/dL (ref 8.9–10.3)
Chloride: 100 mmol/L (ref 98–111)
Creatinine, Ser: 1.19 mg/dL (ref 0.61–1.24)
GFR calc Af Amer: >60 mL/min (ref >60)
GFR calc non Af Amer: >60 mL/min (ref >60)
Glucose, Bld: 96 mg/dL (ref 70–99)
Potassium: 3.4 mmol/L — ABNORMAL LOW (ref 3.5–5.1)
Sodium: 136 mmol/L (ref 135–145)
Total Bilirubin: 0.5 mg/dL (ref 0.3–1.2)
Total Protein: 6.8 g/dL (ref 6.5–8.1)

## 2020-02-28 LAB — URINALYSIS, ROUTINE W REFLEX MICROSCOPIC
Bilirubin Urine: NEGATIVE
Glucose, UA: NEGATIVE mg/dL
Hgb urine dipstick: NEGATIVE
Ketones, ur: NEGATIVE mg/dL
Leukocytes,Ua: NEGATIVE
Nitrite: NEGATIVE
Protein, ur: NEGATIVE mg/dL
Specific Gravity, Urine: 1.019 (ref 1.005–1.030)
pH: 6 (ref 5.0–8.0)

## 2020-02-28 LAB — CBC
HCT: 52.7 % — ABNORMAL HIGH (ref 39.0–52.0)
Hemoglobin: 17.1 g/dL — ABNORMAL HIGH (ref 13.0–17.0)
MCH: 29.6 pg (ref 26.0–34.0)
MCHC: 32.4 g/dL (ref 30.0–36.0)
MCV: 91.3 fL (ref 80.0–100.0)
Platelets: 409 10*3/uL — ABNORMAL HIGH (ref 150–400)
RBC: 5.77 MIL/uL (ref 4.22–5.81)
RDW: 12.3 % (ref 11.5–15.5)
WBC: 10 10*3/uL (ref 4.0–10.5)
nRBC: 0 % (ref 0.0–0.2)

## 2020-02-28 LAB — LIPASE, BLOOD: Lipase: 35 U/L (ref 11–51)

## 2020-02-28 MED ORDER — SODIUM CHLORIDE 0.9% FLUSH: 3.0000 mL | Freq: Once | INTRAVENOUS | Status: DC

## 2020-02-28 NOTE — ED Notes (Signed)
Pt asked about wait times, pt told about wait times and informed that does not necessarily mean he will wait that long. Pt stated "ok have a good night" and walked out of the department.

## 2020-02-28 NOTE — Telephone Encounter (Signed)
Patient contacted the office and states Carl Moore has seen him before for Kidney stones, and he states he is having the same sx as when he had these previously. He states has lots of pain in his back, abdominal area, and groin area. Patient states he only wants to see Carl Moore, but unfortunately her schedule is full today, and he is wondering if his urologist could help him with this? I did advise to call their office in the meantime for an appt, but I would still send a message to Carl Moore to see what she thinks the best next steps are, or what he should do?

## 2020-02-28 NOTE — Telephone Encounter (Signed)
Spoken to patient and he stated that he was waiting to hear back from [place]. I have inform patient to call his Urologist and if he is unable to get appointment with them then Anda Kraft will add patient at 3:20 if he willing.

## 2020-02-28 NOTE — Telephone Encounter (Signed)
Did he get in touch with his Urologist? I can see him tomorrow if he can wait.

## 2020-02-28 NOTE — ED Triage Notes (Signed)
Patient reports RLQ abdominal pain radiating to right lower back onset last night with nausea , no emesis or diarrhea , denies fever or chills .

## 2020-02-28 NOTE — Telephone Encounter (Signed)
Patient will call back and let us know

## 2020-02-29 ENCOUNTER — Telehealth: Payer: Self-pay | Admitting: *Deleted

## 2020-02-29 NOTE — Telephone Encounter (Signed)
Is he willing to come in tomorrow (03/01/20) at 12:20 pm? Okay to add on.

## 2020-02-29 NOTE — Telephone Encounter (Addendum)
Pt called requesting an appt with PCP. PCP has no appts today or tomorrow so requested I send a message to PCP to update her on issue. Pt said he has already seen PCP for right abd pain. Pt has been to the ER due to pain and also he just left his urologist and they advise him it's not a kidney stone or kidney issue and to f/u with PCP. Pt mentioned that PCP thought it could have been his gallbladder and pt wants to know what the next step would be, he ?? if he needs to have it removed, or what ever PCP thinks he should do next. Pt has no other sxs besides the right abd pain.

## 2020-03-01 ENCOUNTER — Ambulatory Visit: Payer: 59 | Admitting: Primary Care

## 2020-03-01 DIAGNOSIS — Z0289 Encounter for other administrative examinations: Secondary | ICD-10-CM

## 2020-03-06 ENCOUNTER — Ambulatory Visit: Payer: 59 | Admitting: Family Medicine

## 2020-03-06 ENCOUNTER — Encounter (HOSPITAL_COMMUNITY): Payer: Self-pay

## 2020-03-06 ENCOUNTER — Other Ambulatory Visit: Payer: Self-pay

## 2020-03-06 ENCOUNTER — Encounter: Payer: Self-pay | Admitting: Family Medicine

## 2020-03-06 VITALS — BP 140/76 | HR 96 | Temp 97.9°F | Ht 66.0 in | Wt 164.2 lb

## 2020-03-06 DIAGNOSIS — K8020 Calculus of gallbladder without cholecystitis without obstruction: Secondary | ICD-10-CM

## 2020-03-06 DIAGNOSIS — Z20822 Contact with and (suspected) exposure to covid-19: Secondary | ICD-10-CM | POA: Diagnosis not present

## 2020-03-06 DIAGNOSIS — R1011 Right upper quadrant pain: Secondary | ICD-10-CM

## 2020-03-06 DIAGNOSIS — R05 Cough: Secondary | ICD-10-CM | POA: Insufficient documentation

## 2020-03-06 HISTORY — DX: Calculus of gallbladder without cholecystitis without obstruction: K80.20

## 2020-03-06 LAB — CBC
HCT: 53.7 % — ABNORMAL HIGH (ref 39.0–52.0)
Hemoglobin: 17.6 g/dL — ABNORMAL HIGH (ref 13.0–17.0)
MCH: 29.5 pg (ref 26.0–34.0)
MCHC: 32.8 g/dL (ref 30.0–36.0)
MCV: 90.1 fL (ref 80.0–100.0)
Platelets: 332 10*3/uL (ref 150–400)
RBC: 5.96 MIL/uL — ABNORMAL HIGH (ref 4.22–5.81)
RDW: 12.1 % (ref 11.5–15.5)
WBC: 8.5 10*3/uL (ref 4.0–10.5)
nRBC: 0 % (ref 0.0–0.2)

## 2020-03-06 LAB — COMPREHENSIVE METABOLIC PANEL
ALT: 23 U/L (ref 0–44)
AST: 18 U/L (ref 15–41)
Albumin: 4.1 g/dL (ref 3.5–5.0)
Alkaline Phosphatase: 95 U/L (ref 38–126)
Anion gap: 8 (ref 5–15)
BUN: 14 mg/dL (ref 6–20)
CO2: 28 mmol/L (ref 22–32)
Calcium: 8.7 mg/dL — ABNORMAL LOW (ref 8.9–10.3)
Chloride: 101 mmol/L (ref 98–111)
Creatinine, Ser: 1.09 mg/dL (ref 0.61–1.24)
GFR calc Af Amer: >60 mL/min (ref >60)
GFR calc non Af Amer: >60 mL/min (ref >60)
Glucose, Bld: 104 mg/dL — ABNORMAL HIGH (ref 70–99)
Potassium: 4 mmol/L (ref 3.5–5.1)
Sodium: 137 mmol/L (ref 135–145)
Total Bilirubin: 0.5 mg/dL (ref 0.3–1.2)
Total Protein: 7.4 g/dL (ref 6.5–8.1)

## 2020-03-06 LAB — LIPASE, BLOOD: Lipase: 30 U/L (ref 11–51)

## 2020-03-06 MED ORDER — ONDANSETRON 4 MG PO TBDP
4.0000 mg | ORAL_TABLET | Freq: Once | ORAL | Status: AC
  Administered 2020-03-06: 4 mg via ORAL

## 2020-03-06 NOTE — Assessment & Plan Note (Addendum)
In known gallstones by prior CT 01/24/2020 with fever to 101 last night concern for cholecystitis or other infectious complication of cholelithiasis. Reviewed labs from ER visit last week - overall reassuring - however he notes progressive worsening symptoms since then, acutely worse in the last 24 hours. Will recommend ER evaluation today. He agrees to EMS transportation.

## 2020-03-06 NOTE — Patient Instructions (Addendum)
I'm worried about infection complication of gallstones. I recommend ER evaluation today. We will send you via ambulance to ER today.   Cholecystitis  Cholecystitis is inflammation of the gallbladder. It is often called a gallbladder attack. The gallbladder is a pear-shaped organ that lies beneath the liver on the right side of the body. The gallbladder stores bile, which is a fluid that helps the body digest fats. If bile builds up in your gallbladder, your gallbladder becomes inflamed. This condition may occur suddenly. Cholecystitis is a serious condition and requires treatment. What are the causes? The most common cause of this condition is gallstones. Gallstones can block the tube (duct) that carries bile out of your gallbladder. This causes bile to build up. Other causes include:  Damage to the gallbladder due to a decrease in blood flow.  Infections in the bile ducts.  Scars or kinks in the bile ducts.  Tumors in the liver, pancreas, or gallbladder. What increases the risk? You are more likely to develop this condition if:  You have sickle cell disease.  You take birth control pills or use estrogen.  You have alcoholic liver disease.  You have liver cirrhosis.  You have your nutrition delivered through a vein (parenteral nutrition).  You are critically ill.  You do not eat or drink for a long time. This is also called "fasting."  You are obese.  You lose weight too fast.  You are pregnant.  You have high levels of fat (triglycerides) in the blood.  You have pancreatitis. What are the signs or symptoms? Symptoms of this condition include:  Pain in the abdomen, especially in the upper right area of the abdomen.  Tenderness or bloating in the abdomen.  Nausea.  Vomiting.  Fever.  Chills. How is this diagnosed? This condition is diagnosed with a medical history and physical exam. You may also have other tests, including:  Imaging tests, such as: ? An  ultrasound of the gallbladder. ? A CT scan of the abdomen. ? A gallbladder nuclear scan (HIDA scan). This scan allows your health care provider to see the bile moving from your liver to your gallbladder and on to your small intestine. ? MRI.  Blood tests, such as: ? A complete blood count. The white blood cell count may be higher than normal. ? Liver function tests. Certain types of gallstones cause some results to be higher than normal. How is this treated? Treatment may include:  Surgery to remove your gallbladder (cholecystectomy).  Antibiotic medicine, usually through an IV.  Fasting for a certain amount of time.  Giving IV fluids.  Medicine to treat pain or vomiting. Follow these instructions at home:  If you had surgery, follow instructions from your health care provider about home care after the procedure. Medicines   Take over-the-counter and prescription medicines only as told by your health care provider.  If you were prescribed an antibiotic medicine, take it as told by your health care provider. Do not stop taking the antibiotic even if you start to feel better. General instructions  Follow instructions from your health care provider about what to eat or drink. When you are allowed to eat, avoid eating or drinking anything that triggers your symptoms.  Do not lift anything that is heavier than 10 lb (4.5 kg), or the limit that you are told, until your health care provider says that it is safe.  Do not use any products that contain nicotine or tobacco, such as cigarettes and e-cigarettes. If  you need help quitting, ask your health care provider.  Keep all follow-up visits as told by your health care provider. This is important. Contact a health care provider if:  Your pain is not controlled with medicine.  You have a fever. Get help right away if:  Your pain moves to another part of your abdomen or to your back.  You continue to have symptoms or you develop  new symptoms even with treatment. Summary  Cholecystitis is inflammation of the gallbladder.  The most common cause of this condition is gallstones. Gallstones can block the tube (duct) that carries bile out of your gallbladder.  Common symptoms are pain in the abdomen, nausea, vomiting, fever, and chills.  This condition is treated with surgery to remove the gallbladder, medicines, fasting, and IV fluids.  Follow your health care provider's instructions for eating and drinking. Avoid eating anything that triggers your symptoms. This information is not intended to replace advice given to you by your health care provider. Make sure you discuss any questions you have with your health care provider. Document Revised: 11/21/2017 Document Reviewed: 11/21/2017 Elsevier Patient Education  [place].

## 2020-03-06 NOTE — Progress Notes (Signed)
This visit was conducted in person.  BP 140/76 (BP Location: Right Arm, Patient Position: Sitting, Cuff Size: Normal)   Pulse 96   Temp 97.9 F (36.6 C) (Temporal)   Ht 5\' 6"  (1.676 m)   Wt 164 lb 3 oz (74.5 kg)   SpO2 99%   BMI 26.50 kg/m    CC: RUQ abd pain  Subjective:    Patient ID: Carl Moore, male    DOB: 11/23/1980, 39 y.o.   MRN: [id number]  HPI: Carl Moore is a 38 y.o. male presenting on 03/06/2020 for Abdominal Pain (C/o RUQ abd pain, worsening.  Also, c/o nausea and dry heaves. Started 2 days ago.  H/o gallstone. )   2d h/o progressive RUQ abd pain associated with nausea, vomiting this morning and dry heaves. Describes throbbing RUQ abd pain that started yesterday - normally crescendo/decrescendo pain over several hours but current flare actually worsening. Feels chilled. Tmax 101 yesterday - treated with tylenol. Today feeling worse. Has had 4 flares RUQ abd pain in the last few months. Last BM normal this morning. Last meal was cereal last night. Some tea and water this morning. Avoiding greasy fatty foods. Took zofran yesterday.   No icterus or jaundice.   Known gallstones on contrasted CT abd/pelvis from late June 2021 - at that time obtained for RLQ abd pain.   Has been to ER with symptoms - latest last week but didn't stay to be seen.      Relevant past medical, surgical, family and social history reviewed and updated as indicated. Interim medical history since our last visit reviewed. Allergies and medications reviewed and updated. Outpatient Medications Prior to Visit  Medication Sig Dispense Refill  . cetirizine (ZYRTEC) 10 MG tablet Take 10 mg by mouth daily as needed for allergies. Seasonally    . cyclobenzaprine (FLEXERIL) 10 MG tablet Take 0.5-1 tablets (5-10 mg total) by mouth at bedtime as needed for muscle spasms. 30 tablet 2  . traZODone (DESYREL) 100 MG tablet Take 1 tablet (100 mg total) by mouth at bedtime. For sleep. 90 tablet 1   No  facility-administered medications prior to visit.     Per HPI unless specifically indicated in ROS section below Review of Systems Objective:  BP 140/76 (BP Location: Right Arm, Patient Position: Sitting, Cuff Size: Normal)   Pulse 96   Temp 97.9 F (36.6 C) (Temporal)   Ht 5\' 6"  (1.676 m)   Wt 164 lb 3 oz (74.5 kg)   SpO2 99%   BMI 26.50 kg/m   Wt Readings from Last 3 Encounters:  03/06/20 164 lb 3 oz (74.5 kg)  02/28/20 187 lb 6.3 oz (85 kg)  02/10/20 168 lb 8 oz (76.4 kg)      Physical Exam Vitals and nursing note reviewed.  Constitutional:      General: He is in acute distress (uncomfortable).     Appearance: Normal appearance.     Comments: Tears due to pain  Cardiovascular:     Rate and Rhythm: Normal rate and regular rhythm.     Pulses: Normal pulses.     Heart sounds: Normal heart sounds. No murmur heard.   Pulmonary:     Effort: Pulmonary effort is normal. No respiratory distress.     Breath sounds: Normal breath sounds. No wheezing, rhonchi or rales.  Abdominal:     General: Abdomen is flat. Bowel sounds are normal. There is no distension.     Palpations: There is no mass.  Tenderness: There is abdominal tenderness (mod-severe) in the right upper quadrant. There is guarding. There is no right CVA tenderness or left CVA tenderness. Negative signs include Murphy's sign.     Hernia: No hernia is present.  Skin:    General: Skin is warm and dry.  Neurological:     Mental Status: He is alert.  Psychiatric:        Mood and Affect: Mood normal.        Behavior: Behavior normal.       Results for orders placed or performed during the hospital encounter of 02/28/20  Lipase, blood  Result Value Ref Range   Lipase 35 11 - 51 U/L  Comprehensive metabolic panel  Result Value Ref Range   Sodium 136 135 - 145 mmol/L   Potassium 3.4 (L) 3.5 - 5.1 mmol/L   Chloride 100 98 - 111 mmol/L   CO2 25 22 - 32 mmol/L   Glucose, Bld 96 70 - 99 mg/dL   BUN 12 6 - 20  mg/dL   Creatinine, Ser 1.19 0.61 - 1.24 mg/dL   Calcium 9.2 8.9 - 10.3 mg/dL   Total Protein 6.8 6.5 - 8.1 g/dL   Albumin 3.9 3.5 - 5.0 g/dL   AST 17 15 - 41 U/L   ALT 25 0 - 44 U/L   Alkaline Phosphatase 88 38 - 126 U/L   Total Bilirubin 0.5 0.3 - 1.2 mg/dL   GFR calc non Af Amer >60 >60 mL/min   GFR calc Af Amer >60 >60 mL/min   Anion gap 11 5 - 15  CBC  Result Value Ref Range   WBC 10.0 4.0 - 10.5 K/uL   RBC 5.77 4.22 - 5.81 MIL/uL   Hemoglobin 17.1 (H) 13.0 - 17.0 g/dL   HCT 52.7 (H) 39 - 52 %   MCV 91.3 80.0 - 100.0 fL   MCH 29.6 26.0 - 34.0 pg   MCHC 32.4 30.0 - 36.0 g/dL   RDW 12.3 11.5 - 15.5 %   Platelets 409 (H) 150 - 400 K/uL   nRBC 0.0 0.0 - 0.2 %  Urinalysis, Routine w reflex microscopic  Result Value Ref Range   Color, Urine YELLOW YELLOW   APPearance CLEAR CLEAR   Specific Gravity, Urine 1.019 1.005 - 1.030   pH 6.0 5.0 - 8.0   Glucose, UA NEGATIVE NEGATIVE mg/dL   Hgb urine dipstick NEGATIVE NEGATIVE   Bilirubin Urine NEGATIVE NEGATIVE   Ketones, ur NEGATIVE NEGATIVE mg/dL   Protein, ur NEGATIVE NEGATIVE mg/dL   Nitrite NEGATIVE NEGATIVE   Leukocytes,Ua NEGATIVE NEGATIVE   Assessment & Plan:  This visit occurred during the SARS-CoV-2 public health emergency.  Safety protocols were in place, including screening questions prior to the visit, additional usage of staff PPE, and extensive cleaning of exam room while observing appropriate contact time as indicated for disinfecting solutions.   Problem List Items Addressed This Visit    Gallstones   Continuous RUQ abdominal pain - Primary    In known gallstones by prior CT 01/24/2020 with fever to 101 last night concern for cholecystitis or other infectious complication of cholelithiasis. Reviewed labs from ER visit last week - overall reassuring - however he notes progressive worsening symptoms since then, acutely worse in the last 24 hours. Will recommend ER evaluation today. He agrees to EMS transportation.             Meds ordered this encounter  Medications  . ondansetron (ZOFRAN-ODT) disintegrating tablet 4  mg   No orders of the defined types were placed in this encounter.   Patient Instructions  I'm worried about infection complication of gallstones. I recommend ER evaluation today. We will send you via ambulance to ER today.    Follow up plan: No follow-ups on file.  Ria Bush, MD

## 2020-03-06 NOTE — ED Triage Notes (Signed)
Pt arrives [facility] from home with complaints of RUQ pain beginning 1 week ago. Pt seen at [facility] on 8/2 but was unable to stay due to the wait. Pt reports hx of a current kidney stone. Pt endorses cough, fever, N/V.

## 2020-03-07 ENCOUNTER — Emergency Department (HOSPITAL_COMMUNITY)
Admission: EM | Admit: 2020-03-07 | Discharge: 2020-03-07 | Disposition: A | Discharge status: Home / Self Care | Payer: 59 | Attending: Emergency Medicine | Admitting: Emergency Medicine

## 2020-03-07 ENCOUNTER — Emergency Department (HOSPITAL_COMMUNITY): Payer: 59

## 2020-03-07 DIAGNOSIS — R1011 Right upper quadrant pain: Secondary | ICD-10-CM

## 2020-03-07 LAB — SARS CORONAVIRUS 2 BY RT PCR (HOSPITAL ORDER, PERFORMED IN ~~LOC~~ HOSPITAL LAB): SARS Coronavirus 2: NEGATIVE

## 2020-03-07 MED ORDER — KETOROLAC TROMETHAMINE 60 MG/2ML IM SOLN
30.0000 mg | Freq: Once | INTRAMUSCULAR | Status: AC
  Administered 2020-03-07: 30 mg via INTRAMUSCULAR
  Filled 2020-03-07: qty 2

## 2020-03-07 MED ORDER — HYOSCYAMINE SULFATE 0.125 MG SL SUBL
0.2500 mg | SUBLINGUAL_TABLET | Freq: Once | SUBLINGUAL | Status: AC
  Administered 2020-03-07: 0.25 mg via SUBLINGUAL
  Filled 2020-03-07: qty 2

## 2020-03-07 MED ORDER — LIDOCAINE VISCOUS HCL 2 % MT SOLN
15.0000 mL | Freq: Once | OROMUCOSAL | Status: AC
  Administered 2020-03-07: 15 mL via ORAL
  Filled 2020-03-07: qty 15

## 2020-03-07 MED ORDER — ALUM & MAG HYDROXIDE-SIMETH 200-200-20 MG/5ML PO SUSP
30.0000 mL | Freq: Once | ORAL | Status: AC
  Administered 2020-03-07: 30 mL via ORAL
  Filled 2020-03-07: qty 30

## 2020-03-07 MED ORDER — ONDANSETRON 4 MG PO TBDP
4.0000 mg | ORAL_TABLET | Freq: Once | ORAL | Status: AC
  Administered 2020-03-07: 4 mg via ORAL
  Filled 2020-03-07: qty 1

## 2020-03-07 NOTE — ED Provider Notes (Signed)
[facility] DEPT Provider Note  CSN: [id number] Arrival date & time: 03/06/20 1253  Chief Complaint(s) Abdominal Pain and Cough  HPI Carl Moore is a 39 y.o. male   CC: RUQ pain  Onset/Duration: 16 hrs Timing: constant Location: RUQ Quality: sharp Severity: severe Modifying Factors:  Improved by: nothing  Worsened by: palpation Associated Signs/Symptoms:  Pertinent (+): N/V. 101 fever  Pertinent (-): chest pain, SOB, cough, diarrhea Context: Seen by PCP who's note reports patient has been having fluctuating pain for 2 days with intermittent pain for a few months.   HPI  Past Medical History Past Medical History:  Diagnosis Date  . Chicken pox   . Diverticulosis   . Fatty liver   . Renal stone    Patient Active Problem List   Diagnosis Date Noted  . Gallstones 03/06/2020  . Continuous RUQ abdominal pain 01/24/2020  . Cerumen impaction 01/24/2020  . Chronic left shoulder pain 12/24/2019  . Insomnia 09/21/2019  . Acquired hallux rigidus 03/10/2019  . Elevated blood pressure reading 08/27/2018  . Acute pain of left shoulder 10/21/2017  . Impingement syndrome of right shoulder region 09/09/2017  . Hypertriglyceridemia 05/10/2016  . Preventative health care 03/24/2015  . Seasonal allergies 12/19/2014   Home Medication(s) Prior to Admission medications   Medication Sig Start Date End Date Taking? Authorizing Provider  cetirizine (ZYRTEC) 10 MG tablet Take 10 mg by mouth daily as needed for allergies. Seasonally    [provider]  cyclobenzaprine (FLEXERIL) 10 MG tablet Take 0.5-1 tablets (5-10 mg total) by mouth at bedtime as needed for muscle spasms. 02/10/20 02/09/21  Copland, Frederico Hamman, MD  traZODone (DESYREL) 100 MG tablet Take 1 tablet (100 mg total) by mouth at bedtime. For sleep. 11/08/19   Pleas [name], NP                                                                                                                                     Past Surgical History Past Surgical History:  Procedure Laterality Date  . bone spur Left 02/2018   left big toe  . KNEE ARTHROSCOPY Left   . ULNAR NERVE REPAIR Left 1999   Family History Family History  Problem Relation Age of Onset  . Hypertension Mother   . Stroke Father        Deceased  . Hypertension Father     Social History Social History   Tobacco Use  . Smoking status: Never Smoker  . Smokeless tobacco: Never Used  Vaping Use  . Vaping Use: Never used  Substance Use Topics  . Alcohol use: No    Alcohol/week: 0.0 standard drinks  . Drug use: No   Allergies Patient has no known allergies.  Review of Systems Review of Systems All other systems are reviewed and are negative for acute change except as noted in the HPI  Physical Exam Vital Signs  I  have reviewed the triage vital signs BP (!) 150/97 (BP Location: Left Arm)   Pulse 88   Temp 98.5 F (36.9 C) (Oral)   Resp 16   Ht 5\' 6"  (1.676 m)   Wt 77.1 kg   SpO2 100%   BMI 27.44 kg/m   Physical Exam Vitals reviewed.  Constitutional:      General: He is not in acute distress.    Appearance: He is well-developed. He is not diaphoretic.  HENT:     Head: Normocephalic and atraumatic.     Jaw: No trismus.     Right Ear: External ear normal.     Left Ear: External ear normal.     Nose: Nose normal.  Eyes:     General: No scleral icterus.    Conjunctiva/sclera: Conjunctivae normal.  Neck:     Trachea: Phonation normal.  Cardiovascular:     Rate and Rhythm: Normal rate and regular rhythm.  Pulmonary:     Effort: Pulmonary effort is normal. No respiratory distress.     Breath sounds: No stridor.  Abdominal:     General: There is no distension.     Tenderness: There is abdominal tenderness in the right upper quadrant. Negative signs include Murphy's sign.  Musculoskeletal:        General: Normal range of motion.     Cervical back: Normal range of motion.  Neurological:      Mental Status: He is alert and oriented to person, place, and time.  Psychiatric:        Behavior: Behavior normal.     ED Results and Treatments Labs (all labs ordered are listed, but only abnormal results are displayed) Labs Reviewed  COMPREHENSIVE METABOLIC PANEL - Abnormal; Notable for the following components:      Result Value   Glucose, Bld 104 (*)    Calcium 8.7 (*)    All other components within normal limits  CBC - Abnormal; Notable for the following components:   RBC 5.96 (*)    Hemoglobin 17.6 (*)    HCT 53.7 (*)    All other components within normal limits  SARS CORONAVIRUS 2 BY RT PCR (HOSPITAL ORDER, [facility])  LIPASE, BLOOD                                                                                                                         EKG  EKG Interpretation  Date/Time:    Ventricular Rate:    PR Interval:    QRS Duration:   QT Interval:    QTC Calculation:   R Axis:     Text Interpretation:        Radiology DG ABD ACUTE 2+V W 1V CHEST  Result Date: 03/07/2020 CLINICAL DATA:  Progressive right upper quadrant abdominal pain with nausea and vomiting for 1 week. EXAM: DG ABDOMEN ACUTE W/ 1V CHEST COMPARISON:  CT of the abdomen pelvis 01/24/2020 FINDINGS: There is no evidence of dilated  bowel loops or free intraperitoneal air. No radiopaque calculi or other significant radiographic abnormality is seen. Heart size and mediastinal contours are within normal limits. Both lungs are clear. IMPRESSION: Negative abdominal radiographs.  No acute cardiopulmonary disease. Electronically Signed   By: San Morelle M.D.   On: 03/07/2020 04:42    Pertinent labs & imaging results that were available during my care of the patient were reviewed by me and considered in my medical decision making (see chart for details).  Medications Ordered in ED Medications  hyoscyamine (LEVSIN SL) SL tablet 0.25 mg (0.25 mg Sublingual Given  03/07/20 0458)  alum & mag hydroxide-simeth (MAALOX/MYLANTA) 200-200-20 MG/5ML suspension 30 mL (30 mLs Oral Given 03/07/20 0459)    And  lidocaine (XYLOCAINE) 2 % viscous mouth solution 15 mL (15 mLs Oral Given 03/07/20 0500)  ondansetron (ZOFRAN-ODT) disintegrating tablet 4 mg (4 mg Oral Given 03/07/20 0458)  ketorolac (TORADOL) injection 30 mg (30 mg Intramuscular Given 03/07/20 0501)                                                                                                                                    Procedures Ultrasound ED Abd  Date/Time: 03/07/2020 6:05 AM Performed by: Fatima Blank, MD Authorized by: Fatima Blank, MD   Procedure details:    Indications: abdominal pain     Assessment for:  Gallstones and hydronephrosis   Left renal:  Visualized   Right renal:  Visualized   Hepatobiliary:  Visualized       Left renal findings:    Hydronephrosis: none   Right renal findings:    Hydronephrosis: none   Hepatobiliary findings:    Common bile duct:  Normal   Gallbladder wall:  Normal   Gallbladder stones: identified     Intra-abdominal fluid: not identified     Sonographic Murphy's sign: negative      (including critical care time)  Medical Decision Making / ED Course I have reviewed the nursing notes for this encounter and the patient's prior records (if available in EHR or on provided paperwork).   Carl Moore was evaluated in Emergency Department on 03/07/2020 for the symptoms described in the history of present illness. He was evaluated in the context of the global COVID-19 pandemic, which necessitated consideration that the patient might be at risk for infection with the SARS-CoV-2 virus that causes COVID-19. Institutional protocols and algorithms that pertain to the evaluation of patients at risk for COVID-19 are in a state of rapid change based on information released by regulatory bodies including the CDC and federal and state  organizations. These policies and algorithms were followed during the patient's care in the ED.  ruq pain. Not peritonitic. Patient has been afebrile throughout his stay. Labs are grossly reassuring without leukocytosis or anemia.  No significant electrolyte derangements or renal sufficiency.  No evidence of biliary obstruction or pancreatitis. Bedside ultrasound notable for  gallstones without evidence of acute cholecystitis or choledocholithiasis. Acute abdominal series was obtained and did not reveal right lower lobe pneumonia, nor obstructive bowel pattern.  Patient was noted to have moderate stool burden in the ascending to descending colon.  Provided with IM Toradol and GI cocktail.  Patient had some relief with this.  Tolerating oral intake.  Low suspicion for serious intra-abdominal inflammatory/infectious process requiring further work-up at this time.  Given patient's reported fever, Covid test was performed.      Final Clinical Impression(s) / ED Diagnoses Final diagnoses:  RUQ pain   The patient appears reasonably screened and/or stabilized for discharge and I doubt any other medical condition or other [facility] requiring further screening, evaluation, or treatment in the ED at this time prior to discharge. Safe for discharge with strict return precautions.  Disposition: Discharge  Condition: Good  I have discussed the results, Dx and Tx plan with the patient/family who expressed understanding and agree(s) with the plan. Discharge instructions discussed at length. The patient/family was given strict return precautions who verbalized understanding of the instructions. No further questions at time of discharge.    ED Discharge Orders    None       Follow Up: Pleas [name], NP [place] [id number] [phone]  Schedule an appointment as soon as possible for a visit  As needed  [facility] [address]  [place] [address] Schedule an appointment as soon as possible for a visit  For close follow up to assess for gallstone      This chart was dictated using voice recognition software.  Despite best efforts to proofread,  errors can occur which can change the documentation meaning.   Fatima Blank, MD 03/07/20 [phone]

## 2020-03-07 NOTE — Discharge Instructions (Signed)

## 2020-03-10 ENCOUNTER — Ambulatory Visit (INDEPENDENT_AMBULATORY_CARE_PROVIDER_SITE_OTHER): Payer: 59 | Admitting: Surgery

## 2020-03-10 ENCOUNTER — Encounter: Payer: Self-pay | Admitting: Surgery

## 2020-03-10 ENCOUNTER — Other Ambulatory Visit: Payer: Self-pay

## 2020-03-10 VITALS — BP 124/79 | HR 87 | Temp 98.0°F | Ht 66.0 in | Wt 162.6 lb

## 2020-03-10 DIAGNOSIS — K8020 Calculus of gallbladder without cholecystitis without obstruction: Secondary | ICD-10-CM | POA: Diagnosis not present

## 2020-03-10 NOTE — Progress Notes (Signed)
03/10/2020  History of Present Illness: Carl Moore is a 39 y.o. male presenting for follow up on cholelithiasis.  He was seen by Dr. Bary Castilla in 12/2018.  He reports that at first he was having somewhat intermittent episodes of RUQ pain.  However, more recently, the episodes have become more frequent and more intense.  Associated with nausea and vomiting.  His appetite has decreased as well, but is able to keep po intake.  He has been to the ED twice recently on 8/2 and 8/10.  On 8/2, he left because of too long wait times, and on 8/10, he was evaluated and his bedside ultrasound did not show cholecystitis.  Unfortunately, it was not a radiology ultrasound, so I am unable to see any images.  His laboratory workup showed normal WBC and normal LFTs, and was discharged home.    He presents today because he is ready to have surgery now and would like to proceed with cholecystectomy.  Denies any fevers at this point, chills, chest pain, shortness of breath.  However, he does report having constipation and has been trying MiraLax without improvement.  He had a very small bowel movement today, and prior to that had been two days.  Past Medical History: Past Medical History:  Diagnosis Date  . Chicken pox   . Diverticulosis   . Fatty liver   . Renal stone      Past Surgical History: Past Surgical History:  Procedure Laterality Date  . bone spur Left 02/2018   left big toe  . KNEE ARTHROSCOPY Left   . LIGAMENT REPAIR  2021  . ULNAR NERVE REPAIR Left 1999    Home Medications: Prior to Admission medications   Medication Sig Start Date End Date Taking? Authorizing Provider  cetirizine (ZYRTEC) 10 MG tablet Take 10 mg by mouth daily as needed for allergies. Seasonally   Yes [provider]  cyclobenzaprine (FLEXERIL) 10 MG tablet Take 0.5-1 tablets (5-10 mg total) by mouth at bedtime as needed for muscle spasms. 02/10/20 02/09/21 Yes Copland, Frederico Hamman, MD  ondansetron (ZOFRAN) 4 MG  tablet Take 4 mg by mouth every 6 (six) hours as needed. 02/29/20  Yes [provider]  traZODone (DESYREL) 100 MG tablet Take 1 tablet (100 mg total) by mouth at bedtime. For sleep. 11/08/19  Yes Pleas [name], NP    Allergies: No Known Allergies  Review of Systems: Review of Systems  Constitutional: Negative for chills and fever.  Respiratory: Negative for shortness of breath.   Cardiovascular: Negative for chest pain.  Gastrointestinal: Positive for abdominal pain, constipation, nausea and vomiting. Negative for diarrhea.  Skin: Negative for rash.    Physical Exam BP 124/79   Pulse 87   Temp 98 F (36.7 C) (Oral)   Ht '5\' 6"'$  (1.676 m)   Wt 162 lb 9.6 oz (73.8 kg)   SpO2 98%   BMI 26.24 kg/m  CONSTITUTIONAL: No acute distress HEENT:  Normocephalic, atraumatic, extraocular motion intact. RESPIRATORY:  Lungs are clear, and breath sounds are equal bilaterally. Normal respiratory effort without pathologic use of accessory muscles. CARDIOVASCULAR: Heart is regular without murmurs, gallops, or rubs. GI: The abdomen is soft, non-distended, with soreness in the RUQ, but negative Murphy's sign.  NEUROLOGIC:  Motor and sensation is grossly normal.  Cranial nerves are grossly intact. PSYCH:  Alert and oriented to person, place and time. Affect is normal.  Labs/Imaging: CT scan abdomen/pelvis 01/24/20: IMPRESSION: Fatty liver. Stable nonobstructing right renal stone. Cholelithiasis without complicating  factors. Diverticulosis without diverticulitis.  Abdomen/Chest Xray 03/07/20: IMPRESSION: Negative abdominal radiographs.  No acute cardiopulmonary disease.  Labs 03/06/20: Na 137, K 4, Cl 101, CO2 28, BUN 14, Cr 1.09.  Total bili  0.5, AST 18, ALT 23, Alk Phos 95.  WBC 8.5, Hgb 17.6, Hct 53.7, Plt 332.  Assessment and Plan: This is a 39 y.o. male with symptomatic cholelithiasis, with worsening symptoms.  --Discussed with the patient that his symptoms are consistent with  biliary colic and may have a degree of chronic cholecystitis.  I agree that it's reasonable to proceed with cholecystectomy.  Discussed with him the role of robotic assisted cholecystectomy, as well as risks of surgery including risk of bleeding, infection, and injury to surrounding structures.  Discussed that we would also inject ICG solution to better visualize the gallbladder, cystic duct, and common bile duct during the surgery.  This would be an outpatient surgery.  Discussed post-op activity restrictions and return to work.  He's also aware that he would need a COVID test prior to surgery.  He's in agreement with this and all of his questions have been answered.  We will schedule him for 03/21/20. --With regards to his constipation, I have recommended that he try Magnesium Citrate or Dulcolax tablets.  If these do not work, then also recommended that he try MiraLax bowel prep, where he would use one bottle of MiraLax mixed in 64 oz Gatorade.  Face-to-face time spent with the patient and care providers was 25 minutes, with more than 50% of the time spent counseling, educating, and coordinating care of the patient.     Melvyn Neth, [facility]

## 2020-03-10 NOTE — H&P (View-Only) (Signed)
03/10/2020  History of Present Illness: Carl Moore is a 39 y.o. male presenting for follow up on cholelithiasis.  He was seen by Dr. Bary Castilla in 12/2018.  He reports that at first he was having somewhat intermittent episodes of RUQ pain.  However, more recently, the episodes have become more frequent and more intense.  Associated with nausea and vomiting.  His appetite has decreased as well, but is able to keep po intake.  He has been to the ED twice recently on 8/2 and 8/10.  On 8/2, he left because of too long wait times, and on 8/10, he was evaluated and his bedside ultrasound did not show cholecystitis.  Unfortunately, it was not a radiology ultrasound, so I am unable to see any images.  His laboratory workup showed normal WBC and normal LFTs, and was discharged home.    He presents today because he is ready to have surgery now and would like to proceed with cholecystectomy.  Denies any fevers at this point, chills, chest pain, shortness of breath.  However, he does report having constipation and has been trying MiraLax without improvement.  He had a very small bowel movement today, and prior to that had been two days.  Past Medical History: Past Medical History:  Diagnosis Date  . Chicken pox   . Diverticulosis   . Fatty liver   . Renal stone      Past Surgical History: Past Surgical History:  Procedure Laterality Date  . bone spur Left 02/2018   left big toe  . KNEE ARTHROSCOPY Left   . LIGAMENT REPAIR  2021  . ULNAR NERVE REPAIR Left 1999    Home Medications: Prior to Admission medications   Medication Sig Start Date End Date Taking? Authorizing Provider  cetirizine (ZYRTEC) 10 MG tablet Take 10 mg by mouth daily as needed for allergies. Seasonally   Yes [provider]  cyclobenzaprine (FLEXERIL) 10 MG tablet Take 0.5-1 tablets (5-10 mg total) by mouth at bedtime as needed for muscle spasms. 02/10/20 02/09/21 Yes Copland, Frederico Hamman, MD  ondansetron (ZOFRAN) 4 MG  tablet Take 4 mg by mouth every 6 (six) hours as needed. 02/29/20  Yes [provider]  traZODone (DESYREL) 100 MG tablet Take 1 tablet (100 mg total) by mouth at bedtime. For sleep. 11/08/19  Yes Pleas [name], NP    Allergies: No Known Allergies  Review of Systems: Review of Systems  Constitutional: Negative for chills and fever.  Respiratory: Negative for shortness of breath.   Cardiovascular: Negative for chest pain.  Gastrointestinal: Positive for abdominal pain, constipation, nausea and vomiting. Negative for diarrhea.  Skin: Negative for rash.    Physical Exam BP 124/79   Pulse 87   Temp 98 F (36.7 C) (Oral)   Ht '5\' 6"'$  (1.676 m)   Wt 162 lb 9.6 oz (73.8 kg)   SpO2 98%   BMI 26.24 kg/m  CONSTITUTIONAL: No acute distress HEENT:  Normocephalic, atraumatic, extraocular motion intact. RESPIRATORY:  Lungs are clear, and breath sounds are equal bilaterally. Normal respiratory effort without pathologic use of accessory muscles. CARDIOVASCULAR: Heart is regular without murmurs, gallops, or rubs. GI: The abdomen is soft, non-distended, with soreness in the RUQ, but negative Murphy's sign.  NEUROLOGIC:  Motor and sensation is grossly normal.  Cranial nerves are grossly intact. PSYCH:  Alert and oriented to person, place and time. Affect is normal.  Labs/Imaging: CT scan abdomen/pelvis 01/24/20: IMPRESSION: Fatty liver. Stable nonobstructing right renal stone. Cholelithiasis without complicating  factors. Diverticulosis without diverticulitis.  Abdomen/Chest Xray 03/07/20: IMPRESSION: Negative abdominal radiographs.  No acute cardiopulmonary disease.  Labs 03/06/20: Na 137, K 4, Cl 101, CO2 28, BUN 14, Cr 1.09.  Total bili  0.5, AST 18, ALT 23, Alk Phos 95.  WBC 8.5, Hgb 17.6, Hct 53.7, Plt 332.  Assessment and Plan: This is a 39 y.o. male with symptomatic cholelithiasis, with worsening symptoms.  --Discussed with the patient that his symptoms are consistent with  biliary colic and may have a degree of chronic cholecystitis.  I agree that it's reasonable to proceed with cholecystectomy.  Discussed with him the role of robotic assisted cholecystectomy, as well as risks of surgery including risk of bleeding, infection, and injury to surrounding structures.  Discussed that we would also inject ICG solution to better visualize the gallbladder, cystic duct, and common bile duct during the surgery.  This would be an outpatient surgery.  Discussed post-op activity restrictions and return to work.  He's also aware that he would need a COVID test prior to surgery.  He's in agreement with this and all of his questions have been answered.  We will schedule him for 03/21/20. --With regards to his constipation, I have recommended that he try Magnesium Citrate or Dulcolax tablets.  If these do not work, then also recommended that he try MiraLax bowel prep, where he would use one bottle of MiraLax mixed in 64 oz Gatorade.  Face-to-face time spent with the patient and care providers was 25 minutes, with more than 50% of the time spent counseling, educating, and coordinating care of the patient.     Melvyn Neth, [facility]

## 2020-03-10 NOTE — Patient Instructions (Addendum)
Our surgery scheduler will contact you next week to discuss surgery. During that call, she will discuss the preparation prior to surgery and also discuss the different day and times for surgery. Please have the BLUE sheet available when she contacts you. If you have any questions regarding the surgery, please do not hesitate to give our office a call. Patient was advised to try over the counter Magnesium Citrate to help with constipation.  Cholelithiasis  Cholelithiasis is a form of gallbladder disease in which gallstones form in the gallbladder. The gallbladder is an organ that stores bile. Bile is made in the liver, and it helps to digest fats. Gallstones begin as small crystals and slowly grow into stones. They may cause no symptoms until the gallbladder tightens (contracts) and a gallstone is blocking the duct (gallbladder attack), which can cause pain. Cholelithiasis is also referred to as gallstones. There are two main types of gallstones:  Cholesterol stones. These are made of hardened cholesterol and are usually yellow-green in color. They are the most common type of gallstone. Cholesterol is a white, waxy, fat-like substance that is made in the liver.  Pigment stones. These are dark in color and are made of a red-yellow substance that forms when hemoglobin from red blood cells breaks down (bilirubin). What are the causes? This condition may be caused by an imbalance in the substances that bile is made of. This can happen if the bile:  Has too much bilirubin.  Has too much cholesterol.  Does not have enough bile salts. These salts help the body absorb and digest fats. In some cases, this condition can also be caused by the gallbladder not emptying completely or often enough. What increases the risk? The following factors may make you more likely to develop this condition:  Being male.  Having multiple pregnancies. Health care providers sometimes advise removing diseased  gallbladders before future pregnancies.  Eating a diet that is heavy in fried foods, fat, and refined carbohydrates, like white bread and white rice.  Being obese.  Being older than age 27.  Prolonged use of medicines that contain male hormones (estrogen).  Having diabetes mellitus.  Rapidly losing weight.  Having a family history of gallstones.  Being of [place] or [place] descent.  Having an intestinal disease such as Crohn disease.  Having metabolic syndrome.  Having cirrhosis.  Having severe types of anemia such as sickle cell anemia. What are the signs or symptoms? In most cases, there are no symptoms. These are known as silent gallstones. If a gallstone blocks the bile ducts, it can cause a gallbladder attack. The main symptom of a gallbladder attack is sudden pain in the upper right abdomen. The pain usually comes at night or after eating a large meal. The pain can last for one or several hours and can spread to the right shoulder or chest. If the bile duct is blocked for more than a few hours, it can cause infection or inflammation of the gallbladder, liver, or pancreas, which may cause:  Nausea.  Vomiting.  Abdominal pain that lasts for 5 hours or more.  Fever or chills.  Yellowing of the skin or the whites of the eyes (jaundice).  Dark urine.  Light-colored stools. How is this diagnosed? This condition may be diagnosed based on:  A physical exam.  Your medical history.  An ultrasound of your gallbladder.  CT scan.  MRI.  Blood tests to check for signs of infection or inflammation.  A scan of your  gallbladder and bile ducts (biliary system) using nonharmful radioactive material and special cameras that can see the radioactive material (cholescintigram). This test checks to see how your gallbladder contracts and whether bile ducts are blocked.  Inserting a small tube with a camera on the end (endoscope) through your mouth to inspect bile  ducts and check for blockages (endoscopic retrograde cholangiopancreatogram). How is this treated? Treatment for gallstones depends on the severity of the condition. Silent gallstones do not need treatment. If the gallstones cause a gallbladder attack or other symptoms, treatment may be required. Options for treatment include:  Surgery to remove the gallbladder (cholecystectomy). This is the most common treatment.  Medicines to dissolve gallstones. These are most effective at treating small gallstones. You may need to take medicines for up to 6-12 months.  Shock wave treatment (extracorporeal biliary lithotripsy). In this treatment, an ultrasound machine sends shock waves to the gallbladder to break gallstones into smaller pieces. These pieces can then be passed into the intestines or be dissolved by medicine. This is rarely used.  Removing gallstones through endoscopic retrograde cholangiopancreatogram. A small basket can be attached to the endoscope and used to capture and remove gallstones. Follow these instructions at home:  Take over-the-counter and prescription medicines only as told by your health care provider.  Maintain a healthy weight and follow a healthy diet. This includes: ? Reducing fatty foods, such as fried food. ? Reducing refined carbohydrates, like white bread and white rice. ? Increasing fiber. Aim for foods like almonds, fruit, and beans.  Keep all follow-up visits as told by your health care provider. This is important. Contact a health care provider if:  You think you have had a gallbladder attack.  You have been diagnosed with silent gallstones and you develop abdominal pain or indigestion. Get help right away if:  You have pain from a gallbladder attack that lasts for more than 2 hours.  You have abdominal pain that lasts for more than 5 hours.  You have a fever or chills.  You have persistent nausea and vomiting.  You develop jaundice.  You have dark  urine or light-colored stools. Summary  Cholelithiasis (also called gallstones) is a form of gallbladder disease in which gallstones form in the gallbladder.  This condition is caused by an imbalance in the substances that make up bile. This can happen if the bile has too much cholesterol, too much bilirubin, or not enough bile salts.  You are more likely to develop this condition if you are male, pregnant, using medicines with estrogen, obese, older than age 34, or have a family history of gallstones. You may also develop gallstones if you have diabetes, an intestinal disease, cirrhosis, or metabolic syndrome.  Treatment for gallstones depends on the severity of the condition. Silent gallstones do not need treatment.  If gallstones cause a gallbladder attack or other symptoms, treatment may be needed. The most common treatment is surgery to remove the gallbladder. This information is not intended to replace advice given to you by your health care provider. Make sure you discuss any questions you have with your health care provider. Document Revised: 06/27/2017 Document Reviewed: 03/31/2016 Elsevier Patient Education  [place].  Laparoscopic Cholecystectomy Laparoscopic cholecystectomy is surgery to remove the gallbladder. The gallbladder is a pear-shaped organ that lies beneath the liver on the right side of the body. The gallbladder stores bile, which is a fluid that helps the body to digest fats. Cholecystectomy is often done for inflammation of the  gallbladder (cholecystitis). This condition is usually caused by a buildup of gallstones (cholelithiasis) in the gallbladder. Gallstones can block the flow of bile, which can result in inflammation and pain. In severe cases, emergency surgery may be required. This procedure is done though small incisions in your abdomen (laparoscopic surgery). A thin scope with a camera (laparoscope) is inserted through one incision. Thin surgical  instruments are inserted through the other incisions. In some cases, a laparoscopic procedure may be turned into a type of surgery that is done through a larger incision (open surgery). Tell a health care provider about:  Any allergies you have.  All medicines you are taking, including vitamins, herbs, eye drops, creams, and over-the-counter medicines.  Any problems you or family members have had with anesthetic medicines.  Any blood disorders you have.  Any surgeries you have had.  Any medical conditions you have.  Whether you are pregnant or may be pregnant. What are the risks? Generally, this is a safe procedure. However, problems may occur, including:  Infection.  Bleeding.  Allergic reactions to medicines.  Damage to other structures or organs.  A stone remaining in the common bile duct. The common bile duct carries bile from the gallbladder into the small intestine.  A bile leak from the cyst duct that is clipped when your gallbladder is removed. What happens before the procedure? Staying hydrated Follow instructions from your health care provider about hydration, which may include:  Up to 2 hours before the procedure - you may continue to drink clear liquids, such as water, clear fruit juice, black coffee, and plain tea. Eating and drinking restrictions Follow instructions from your health care provider about eating and drinking, which may include:  8 hours before the procedure - stop eating heavy meals or foods such as meat, fried foods, or fatty foods.  6 hours before the procedure - stop eating light meals or foods, such as toast or cereal.  6 hours before the procedure - stop drinking milk or drinks that contain milk.  2 hours before the procedure - stop drinking clear liquids. Medicines  Ask your health care provider about: ? Changing or stopping your regular medicines. This is especially important if you are taking diabetes medicines or blood  thinners. ? Taking medicines such as aspirin and ibuprofen. These medicines can thin your blood. Do not take these medicines before your procedure if your health care provider instructs you not to.  You may be given antibiotic medicine to help prevent infection. General instructions  Let your health care provider know if you develop a cold or an infection before surgery.  Plan to have someone take you home from the hospital or clinic.  Ask your health care provider how your surgical site will be marked or identified. What happens during the procedure?   To reduce your risk of infection: ? Your health care team will wash or sanitize their hands. ? Your skin will be washed with soap. ? Hair may be removed from the surgical area.  An IV tube may be inserted into one of your veins.  You will be given one or more of the following: ? A medicine to help you relax (sedative). ? A medicine to make you fall asleep (general anesthetic).  A breathing tube will be placed in your mouth.  Your surgeon will make several small cuts (incisions) in your abdomen.  The laparoscope will be inserted through one of the small incisions. The camera on the laparoscope will send images  to a TV screen (monitor) in the operating room. This lets your surgeon see inside your abdomen.  Air-like gas will be pumped into your abdomen. This will expand your abdomen to give the surgeon more room to perform the surgery.  Other tools that are needed for the procedure will be inserted through the other incisions. The gallbladder will be removed through one of the incisions.  Your common bile duct may be examined. If stones are found in the common bile duct, they may be removed.  After your gallbladder has been removed, the incisions will be closed with stitches (sutures), staples, or skin glue.  Your incisions may be covered with a bandage (dressing). The procedure may vary among health care providers and  hospitals. What happens after the procedure?  Your blood pressure, heart rate, breathing rate, and blood oxygen level will be monitored until the medicines you were given have worn off.  You will be given medicines as needed to control your pain.  Do not drive for 24 hours if you were given a sedative. This information is not intended to replace advice given to you by your health care provider. Make sure you discuss any questions you have with your health care provider. Document Revised: 06/27/2017 Document Reviewed: 01/01/2016 Elsevier Patient Education  [place].

## 2020-03-13 ENCOUNTER — Telehealth: Payer: Self-pay | Admitting: Surgery

## 2020-03-13 NOTE — Telephone Encounter (Signed)
Pt has been advised of Pre-Admission date/time, COVID Testing date and Surgery date.  Surgery Date: 03/21/20 Preadmission Testing Date: 03/16/20 (phone 8a-1p) Covid Testing Date: 03/17/20 - patient advised to go to the [facility] (Normal) between 8a-1p  Patient has been made aware to call [phone], between 1-3:00pm the day before surgery, to find out what time to arrive for surgery.

## 2020-03-16 ENCOUNTER — Encounter
Admission: RE | Admit: 2020-03-16 | Discharge: 2020-03-16 | Disposition: A | Discharge status: Home / Self Care | Payer: 59 | Source: Ambulatory Visit | Attending: Surgery | Admitting: Surgery

## 2020-03-16 ENCOUNTER — Other Ambulatory Visit: Payer: Self-pay

## 2020-03-16 DIAGNOSIS — Z20822 Contact with and (suspected) exposure to covid-19: Secondary | ICD-10-CM | POA: Insufficient documentation

## 2020-03-16 DIAGNOSIS — Z01812 Encounter for preprocedural laboratory examination: Secondary | ICD-10-CM | POA: Insufficient documentation

## 2020-03-16 HISTORY — DX: Personal history of urinary calculi: Z87.442

## 2020-03-16 NOTE — Patient Instructions (Addendum)
Your procedure is scheduled on: 03/21/20 Report to [facility]. To find out your arrival time please call [phone] between 1PM - 3PM on 03/20/20.  Remember: Instructions that are not followed completely may result in serious medical risk, up to and including death, or upon the discretion of your surgeon and anesthesiologist your surgery may need to be rescheduled.     _X__ 1. Do not eat food after midnight the night before your procedure.                 No gum chewing or hard candies. You may drink clear liquids up to 2 hours                 before you are scheduled to arrive for your surgery- DO not drink clear                 liquids within 2 hours of the start of your surgery.                 Clear Liquids include:  water, apple juice without pulp, clear carbohydrate                 drink such as Clearfast or Gatorade, Black Coffee or Tea (Do not add                 anything to coffee or tea). Diabetics water only  __X__2.  On the morning of surgery brush your teeth with toothpaste and water, you                 may rinse your mouth with mouthwash if you wish.  Do not swallow any              toothpaste of mouthwash.     _X__ 3.  No Alcohol for 24 hours before or after surgery.   _X__ 4.  Do Not Smoke or use e-cigarettes For 24 Hours Prior to Your Surgery.                 Do not use any chewable tobacco products for at least 6 hours prior to                 surgery.  ____  5.  Bring all medications with you on the day of surgery if instructed.   __X__  6.  Notify your doctor if there is any change in your medical condition      (cold, fever, infections).     Do not wear jewelry, make-up, hairpins, clips or nail polish. Do not wear lotions, powders, or perfumes.  Do not shave 48 hours prior to surgery. Men may shave face and neck. Do not bring valuables to the hospital.    [facility] is not responsible for any belongings or  valuables.  Contacts, dentures/partials or body piercings may not be worn into surgery. Bring a case for your contacts, glasses or hearing aids, a denture cup will be supplied. Leave your suitcase in the car. After surgery it may be brought to your room. For patients admitted to the hospital, discharge time is determined by your treatment team.   Patients discharged the day of surgery will not be allowed to drive home.   Please read over the following fact sheets that you were given:   MRSA Information  __X__ Take these medicines the morning of surgery with A SIP OF WATER:  1. none  2.   3.   4.  5.  6.  ____ Fleet Enema (as directed)   __X__ Use CHG Soap/SAGE wipes as directed  ____ Use inhalers on the day of surgery  ____ Stop metformin/Janumet/Farxiga 2 days prior to surgery    ____ Take 1/2 of usual insulin dose the night before surgery. No insulin the morning          of surgery.   ____ Stop Blood Thinners Coumadin/Plavix/Xarelto/Pleta/Pradaxa/Eliquis/Effient/Aspirin  on   Or contact your Surgeon, Cardiologist or Medical Doctor regarding  ability to stop your blood thinners  __X__ Stop Anti-inflammatories 7 days before surgery such as Advil, Ibuprofen, Motrin,  BC or Goodies Powder, Naprosyn, Naproxen, Aleve, Aspirin   May take tylenol if needed  __X__ Stop all herbal supplements, fish oil or vitamin E until after surgery.    ____ Bring C-Pap to the hospital.      How to Use Chlorhexidine for Bathing Chlorhexidine gluconate (CHG) is a germ-killing (antiseptic) solution that is used to clean the skin. It can get rid of the bacteria that normally live on the skin and can keep them away for about 24 hours. To clean your skin with CHG, you may be given:  A CHG solution to use in the shower or as part of a sponge bath.  A prepackaged cloth that contains CHG. Cleaning your skin with CHG may help lower the risk for infection:  While you are staying in the intensive  care unit of the hospital.  If you have a vascular access, such as a central line, to provide short-term or long-term access to your veins.  If you have a catheter to drain urine from your bladder.  If you are on a ventilator. A ventilator is a machine that helps you breathe by moving air in and out of your lungs.  After surgery. What are the risks? Risks of using CHG include:  A skin reaction.  Hearing loss, if CHG gets in your ears.  Eye injury, if CHG gets in your eyes and is not rinsed out.  The CHG product catching fire. Make sure that you avoid smoking and flames after applying CHG to your skin. Do not use CHG:  If you have a chlorhexidine allergy or have previously reacted to chlorhexidine.  On babies younger than 39 months of age. How to use CHG solution  Use CHG only as told by your health care provider, and follow the instructions on the label.  Use the full amount of CHG as directed. Usually, this is one bottle. During a shower Follow these steps when using CHG solution during a shower (unless your health care provider gives you different instructions): 1. Start the shower. 2. Use your normal soap and shampoo to wash your face and hair. 3. Turn off the shower or move out of the shower stream. 4. Pour the CHG onto a clean washcloth. Do not use any type of brush or rough-edged sponge. 5. Starting at your neck, lather your body down to your toes. Make sure you follow these instructions: ? If you will be having surgery, pay special attention to the part of your body where you will be having surgery. Scrub this area for at least 1 minute. ? Do not use CHG on your head or face. If the solution gets into your ears or eyes, rinse them well with water. ? Avoid your genital area. ? Avoid any areas of skin that have broken skin, cuts, or  scrapes. ? Scrub your back and under your arms. Make sure to wash skin folds. 6. Let the lather sit on your skin for 1-2 minutes or as long  as told by your health care provider. 7. Thoroughly rinse your entire body in the shower. Make sure that all body creases and crevices are rinsed well. 8. Dry off with a clean towel. Do not put any substances on your body afterward--such as powder, lotion, or perfume--unless you are told to do so by your health care provider. Put on clean clothes or pajamas. 9. If it is the night before your surgery, sleep in clean sheets.    10. Repeat the morning of surgery

## 2020-03-17 ENCOUNTER — Other Ambulatory Visit
Admission: RE | Admit: 2020-03-17 | Discharge: 2020-03-17 | Disposition: A | Discharge status: Home / Self Care | Payer: 59 | Source: Ambulatory Visit | Attending: Surgery | Admitting: Surgery

## 2020-03-17 DIAGNOSIS — Z01812 Encounter for preprocedural laboratory examination: Secondary | ICD-10-CM | POA: Diagnosis present

## 2020-03-17 DIAGNOSIS — Z20822 Contact with and (suspected) exposure to covid-19: Secondary | ICD-10-CM | POA: Diagnosis not present

## 2020-03-18 LAB — SARS CORONAVIRUS 2 (TAT 6-24 HRS): SARS Coronavirus 2: NEGATIVE

## 2020-03-21 ENCOUNTER — Encounter
Admission: RE | Disposition: A | Discharge status: Home / Self Care | Payer: Self-pay | Source: Home / Self Care | Attending: Surgery

## 2020-03-21 ENCOUNTER — Ambulatory Visit
Admission: RE | Admit: 2020-03-21 | Discharge: 2020-03-21 | Disposition: A | Discharge status: Home / Self Care | Payer: 59 | Attending: Surgery | Admitting: Surgery

## 2020-03-21 ENCOUNTER — Other Ambulatory Visit: Payer: Self-pay

## 2020-03-21 ENCOUNTER — Ambulatory Visit: Payer: 59 | Admitting: Registered Nurse

## 2020-03-21 ENCOUNTER — Encounter: Payer: Self-pay | Admitting: Surgery

## 2020-03-21 DIAGNOSIS — Z79899 Other long term (current) drug therapy: Secondary | ICD-10-CM | POA: Insufficient documentation

## 2020-03-21 DIAGNOSIS — K5900 Constipation, unspecified: Secondary | ICD-10-CM | POA: Diagnosis not present

## 2020-03-21 DIAGNOSIS — K8010 Calculus of gallbladder with chronic cholecystitis without obstruction: Secondary | ICD-10-CM | POA: Diagnosis not present

## 2020-03-21 DIAGNOSIS — K760 Fatty (change of) liver, not elsewhere classified: Secondary | ICD-10-CM | POA: Diagnosis not present

## 2020-03-21 DIAGNOSIS — K8020 Calculus of gallbladder without cholecystitis without obstruction: Secondary | ICD-10-CM | POA: Diagnosis not present

## 2020-03-21 SURGERY — CHOLECYSTECTOMY, ROBOT-ASSISTED, LAPAROSCOPIC
Anesthesia: General

## 2020-03-21 MED ORDER — INDOCYANINE GREEN 25 MG IV SOLR
2.5000 mg | Freq: Once | INTRAVENOUS | Status: AC
  Administered 2020-03-21: 2.5 mg via INTRAVENOUS
  Filled 2020-03-21: qty 1

## 2020-03-21 MED ORDER — BUPIVACAINE HCL (PF) 0.25 % IJ SOLN
INTRAMUSCULAR | Status: AC
  Filled 2020-03-21: qty 30

## 2020-03-21 MED ORDER — FENTANYL CITRATE (PF) 100 MCG/2ML IJ SOLN
INTRAMUSCULAR | Status: AC
  Filled 2020-03-21: qty 2

## 2020-03-21 MED ORDER — CHLORHEXIDINE GLUCONATE 0.12 % MT SOLN
OROMUCOSAL | Status: AC
  Administered 2020-03-21: 15 mL via OROMUCOSAL
  Filled 2020-03-21: qty 15

## 2020-03-21 MED ORDER — PROPOFOL 10 MG/ML IV BOLUS
INTRAVENOUS | Status: AC
  Filled 2020-03-21: qty 40

## 2020-03-21 MED ORDER — ORAL CARE MOUTH RINSE: 15.0000 mL | Freq: Once | OROMUCOSAL | Status: AC

## 2020-03-21 MED ORDER — LIDOCAINE HCL (CARDIAC) PF 100 MG/5ML IV SOSY
PREFILLED_SYRINGE | INTRAVENOUS | Status: DC | PRN
  Administered 2020-03-21: 80 mg via INTRAVENOUS

## 2020-03-21 MED ORDER — ONDANSETRON HCL 4 MG/2ML IJ SOLN
4.0000 mg | Freq: Once | INTRAMUSCULAR | Status: AC | PRN
  Administered 2020-03-21: 4 mg via INTRAVENOUS

## 2020-03-21 MED ORDER — CHLORHEXIDINE GLUCONATE CLOTH 2 % EX PADS: 6.0000 | MEDICATED_PAD | Freq: Once | CUTANEOUS | Status: DC

## 2020-03-21 MED ORDER — OXYCODONE HCL 5 MG PO TABS
ORAL_TABLET | ORAL | Status: AC
  Administered 2020-03-21: 5 mg
  Filled 2020-03-21: qty 1

## 2020-03-21 MED ORDER — LACTATED RINGERS IV SOLN: INTRAVENOUS | Status: DC

## 2020-03-21 MED ORDER — ROCURONIUM BROMIDE 100 MG/10ML IV SOLN
INTRAVENOUS | Status: DC | PRN
  Administered 2020-03-21: 20 mg via INTRAVENOUS
  Administered 2020-03-21: 10 mg via INTRAVENOUS
  Administered 2020-03-21: 50 mg via INTRAVENOUS

## 2020-03-21 MED ORDER — IBUPROFEN 600 MG PO TABS: 600.0000 mg | ORAL_TABLET | Freq: Three times a day (TID) | ORAL | 1 refills | Status: DC | PRN

## 2020-03-21 MED ORDER — KETOROLAC TROMETHAMINE 30 MG/ML IJ SOLN
INTRAMUSCULAR | Status: AC
  Filled 2020-03-21: qty 1

## 2020-03-21 MED ORDER — PHENYLEPHRINE HCL (PRESSORS) 10 MG/ML IV SOLN
INTRAVENOUS | Status: AC
  Filled 2020-03-21: qty 1

## 2020-03-21 MED ORDER — LIDOCAINE HCL (PF) 2 % IJ SOLN
INTRAMUSCULAR | Status: AC
  Filled 2020-03-21: qty 5

## 2020-03-21 MED ORDER — ACETAMINOPHEN 500 MG PO TABS
1000.0000 mg | ORAL_TABLET | ORAL | Status: AC
  Administered 2020-03-21: 1000 mg via ORAL

## 2020-03-21 MED ORDER — KETAMINE HCL 50 MG/ML IJ SOLN
INTRAMUSCULAR | Status: AC
  Filled 2020-03-21: qty 10

## 2020-03-21 MED ORDER — GABAPENTIN 300 MG PO CAPS
300.0000 mg | ORAL_CAPSULE | ORAL | Status: AC
  Administered 2020-03-21: 300 mg via ORAL

## 2020-03-21 MED ORDER — KETAMINE HCL 50 MG/ML IJ SOLN
INTRAMUSCULAR | Status: DC | PRN
  Administered 2020-03-21: 50 mg via INTRAMUSCULAR

## 2020-03-21 MED ORDER — CEFAZOLIN SODIUM-DEXTROSE 2-4 GM/100ML-% IV SOLN
2.0000 g | INTRAVENOUS | Status: AC
  Administered 2020-03-21: 2 g via INTRAVENOUS

## 2020-03-21 MED ORDER — FAMOTIDINE 20 MG PO TABS: 20.0000 mg | ORAL_TABLET | Freq: Once | ORAL | Status: AC

## 2020-03-21 MED ORDER — DEXAMETHASONE SODIUM PHOSPHATE 10 MG/ML IJ SOLN
INTRAMUSCULAR | Status: AC
  Filled 2020-03-21: qty 1

## 2020-03-21 MED ORDER — FENTANYL CITRATE (PF) 100 MCG/2ML IJ SOLN
INTRAMUSCULAR | Status: DC | PRN
  Administered 2020-03-21: 50 ug via INTRAVENOUS

## 2020-03-21 MED ORDER — FENTANYL CITRATE (PF) 100 MCG/2ML IJ SOLN
25.0000 ug | INTRAMUSCULAR | Status: DC | PRN
  Administered 2020-03-21 (×4): 25 ug via INTRAVENOUS

## 2020-03-21 MED ORDER — BUPIVACAINE-EPINEPHRINE (PF) 0.25% -1:200000 IJ SOLN
INTRAMUSCULAR | Status: AC
  Filled 2020-03-21: qty 30

## 2020-03-21 MED ORDER — CHLORHEXIDINE GLUCONATE 0.12 % MT SOLN: 15.0000 mL | Freq: Once | OROMUCOSAL | Status: AC

## 2020-03-21 MED ORDER — FAMOTIDINE 20 MG PO TABS
ORAL_TABLET | ORAL | Status: AC
  Administered 2020-03-21: 20 mg via ORAL
  Filled 2020-03-21: qty 1

## 2020-03-21 MED ORDER — MIDAZOLAM HCL 2 MG/2ML IJ SOLN
INTRAMUSCULAR | Status: DC | PRN
  Administered 2020-03-21: 2 mg via INTRAVENOUS

## 2020-03-21 MED ORDER — CEFAZOLIN SODIUM-DEXTROSE 2-4 GM/100ML-% IV SOLN
INTRAVENOUS | Status: AC
  Filled 2020-03-21: qty 100

## 2020-03-21 MED ORDER — KETOROLAC TROMETHAMINE 30 MG/ML IJ SOLN
INTRAMUSCULAR | Status: DC | PRN
  Administered 2020-03-21: 15 mg via INTRAVENOUS

## 2020-03-21 MED ORDER — PROPOFOL 10 MG/ML IV BOLUS
INTRAVENOUS | Status: DC | PRN
  Administered 2020-03-21: 180 mg via INTRAVENOUS

## 2020-03-21 MED ORDER — ONDANSETRON HCL 4 MG/2ML IJ SOLN
INTRAMUSCULAR | Status: DC | PRN
  Administered 2020-03-21: 4 mg via INTRAVENOUS

## 2020-03-21 MED ORDER — BUPIVACAINE-EPINEPHRINE (PF) 0.25% -1:200000 IJ SOLN
INTRAMUSCULAR | Status: DC | PRN
  Administered 2020-03-21: 30 mL

## 2020-03-21 MED ORDER — ONDANSETRON HCL 4 MG/2ML IJ SOLN
INTRAMUSCULAR | Status: AC
  Filled 2020-03-21: qty 2

## 2020-03-21 MED ORDER — OXYCODONE HCL 5 MG PO TABS: 5.0000 mg | ORAL_TABLET | ORAL | 0 refills | Status: DC | PRN

## 2020-03-21 MED ORDER — GABAPENTIN 300 MG PO CAPS
ORAL_CAPSULE | ORAL | Status: AC
  Filled 2020-03-21: qty 1

## 2020-03-21 MED ORDER — EPINEPHRINE PF 1 MG/ML IJ SOLN
INTRAMUSCULAR | Status: AC
  Filled 2020-03-21: qty 1

## 2020-03-21 MED ORDER — MIDAZOLAM HCL 2 MG/2ML IJ SOLN
INTRAMUSCULAR | Status: AC
  Filled 2020-03-21: qty 2

## 2020-03-21 MED ORDER — ACETAMINOPHEN 500 MG PO TABS
ORAL_TABLET | ORAL | Status: AC
  Filled 2020-03-21: qty 2

## 2020-03-21 MED ORDER — DEXAMETHASONE SODIUM PHOSPHATE 10 MG/ML IJ SOLN
INTRAMUSCULAR | Status: DC | PRN
  Administered 2020-03-21: 6 mg via INTRAVENOUS

## 2020-03-21 SURGICAL SUPPLY — 54 items
ADH SKN CLS APL DERMABOND .7 (GAUZE/BANDAGES/DRESSINGS) ×1
APL PRP STRL LF DISP 70% ISPRP (MISCELLANEOUS) ×1
BAG INFUSER PRESSURE 100CC (MISCELLANEOUS) IMPLANT
BAG SPEC RTRVL LRG 6X4 10 (ENDOMECHANICALS) ×1
CANISTER SUCT 1200ML W/VALVE (MISCELLANEOUS) ×2 IMPLANT
CANNULA REDUC XI 12-8 STAPL (CANNULA) ×1
CANNULA REDUCER 12-8 DVNC XI (CANNULA) ×1 IMPLANT
CHLORAPREP W/TINT 26 (MISCELLANEOUS) ×2 IMPLANT
CLIP VESOLOCK MED LG 6/CT (CLIP) ×2 IMPLANT
COVER WAND RF STERILE (DRAPES) ×2 IMPLANT
CUP MEDICINE 2OZ PLAST GRAD ST (MISCELLANEOUS) IMPLANT
DECANTER SPIKE VIAL GLASS SM (MISCELLANEOUS) ×2 IMPLANT
DEFOGGER SCOPE WARMER CLEARIFY (MISCELLANEOUS) ×2 IMPLANT
DERMABOND ADVANCED (GAUZE/BANDAGES/DRESSINGS) ×1
DERMABOND ADVANCED .7 DNX12 (GAUZE/BANDAGES/DRESSINGS) ×1 IMPLANT
DRAPE ARM DVNC X/XI (DISPOSABLE) ×4 IMPLANT
DRAPE COLUMN DVNC XI (DISPOSABLE) ×1 IMPLANT
DRAPE DA VINCI XI ARM (DISPOSABLE) ×4
DRAPE DA VINCI XI COLUMN (DISPOSABLE) ×1
ELECT CAUTERY BLADE 6.4 (BLADE) ×2 IMPLANT
ELECT REM PT RETURN 9FT ADLT (ELECTROSURGICAL) ×2
ELECTRODE REM PT RTRN 9FT ADLT (ELECTROSURGICAL) ×1 IMPLANT
GLOVE SURG SYN 7.0 (GLOVE) ×8 IMPLANT
GLOVE SURG SYN 7.5  E (GLOVE) ×4
GLOVE SURG SYN 7.5 E (GLOVE) ×4 IMPLANT
GOWN STRL REUS W/ TWL LRG LVL3 (GOWN DISPOSABLE) ×4 IMPLANT
GOWN STRL REUS W/TWL LRG LVL3 (GOWN DISPOSABLE) ×8
IRRIGATOR SUCT 8 DISP DVNC XI (IRRIGATION / IRRIGATOR) IMPLANT
IRRIGATOR SUCTION 8MM XI DISP (IRRIGATION / IRRIGATOR)
IV NS 1000ML (IV SOLUTION)
IV NS 1000ML BAXH (IV SOLUTION) IMPLANT
KIT PINK PAD W/HEAD ARE REST (MISCELLANEOUS) ×2
KIT PINK PAD W/HEAD ARM REST (MISCELLANEOUS) ×1 IMPLANT
LABEL OR SOLS (LABEL) ×2 IMPLANT
NEEDLE HYPO 22GX1.5 SAFETY (NEEDLE) ×2 IMPLANT
NS IRRIG 500ML POUR BTL (IV SOLUTION) ×2 IMPLANT
OBTURATOR OPTICAL STANDARD 8MM (TROCAR) ×1
OBTURATOR OPTICAL STND 8 DVNC (TROCAR) ×1
OBTURATOR OPTICALSTD 8 DVNC (TROCAR) ×1 IMPLANT
PACK LAP CHOLECYSTECTOMY (MISCELLANEOUS) ×2 IMPLANT
PENCIL ELECTRO HAND CTR (MISCELLANEOUS) ×2 IMPLANT
POUCH SPECIMEN RETRIEVAL 10MM (ENDOMECHANICALS) ×2 IMPLANT
SEAL CANN UNIV 5-8 DVNC XI (MISCELLANEOUS) ×4 IMPLANT
SEAL XI 5MM-8MM UNIVERSAL (MISCELLANEOUS) ×4
SET TUBE SMOKE EVAC HIGH FLOW (TUBING) ×2 IMPLANT
SOLUTION ELECTROLUBE (MISCELLANEOUS) ×2 IMPLANT
SPONGE LAP 18X18 RF (DISPOSABLE) IMPLANT
SPONGE LAP 4X18 RFD (DISPOSABLE) ×2 IMPLANT
STAPLER CANNULA SEAL DVNC XI (STAPLE) ×1 IMPLANT
STAPLER CANNULA SEAL XI (STAPLE) ×1
SUT MNCRL AB 4-0 PS2 18 (SUTURE) ×2 IMPLANT
SUT VICRYL 0 AB UR-6 (SUTURE) ×4 IMPLANT
TAPE TRANSPORE STRL 2 31045 (GAUZE/BANDAGES/DRESSINGS) ×2 IMPLANT
TROCAR BALLN GELPORT 12X130M (ENDOMECHANICALS) ×2 IMPLANT

## 2020-03-21 NOTE — Interval H&P Note (Signed)
History and Physical Interval Note:  03/21/2020 7:45 AM  Carl Moore  has presented today for surgery, with the diagnosis of symptomatic cholelithiasis.  The various methods of treatment have been discussed with the patient and family. After consideration of risks, benefits and other options for treatment, the patient has consented to  Procedure(s): XI ROBOTIC ASSISTED LAPAROSCOPIC CHOLECYSTECTOMY (N/A) [place] (ICG) (N/A) as a surgical intervention.  The patient's history has been reviewed, patient examined, no change in status, stable for surgery.  I have reviewed the patient's chart and labs.  Questions were answered to the patient's satisfaction.     [name]

## 2020-03-21 NOTE — Transfer of Care (Signed)
Immediate Anesthesia Transfer of Care Note  Patient: Carl Moore  Procedure(s) Performed: XI ROBOTIC ASSISTED LAPAROSCOPIC CHOLECYSTECTOMY (N/A ) INDOCYANINE GREEN FLUORESCENCE IMAGING (ICG) (N/A )  Patient Location: PACU  Anesthesia Type:General  Level of Consciousness: awake, alert  and drowsy  Airway & Oxygen Therapy: Patient Spontanous Breathing and Patient connected to face mask oxygen  Post-op Assessment: Report given to RN and Post -op Vital signs reviewed and stable  Post vital signs: Reviewed and stable  Last Vitals:  Vitals Value Taken Time  BP 135/65 03/21/20 1009  Temp 35.8 C 03/21/20 1005  Pulse 69 03/21/20 1009  Resp 20 03/21/20 1009  SpO2 100 % 03/21/20 1009    Last Pain:  Vitals:   03/21/20 1005  TempSrc:   PainSc: 0-No pain      Patients Stated Pain Goal: 0 (64/31/42 7670)  Complications: No complications documented.

## 2020-03-21 NOTE — Op Note (Signed)
  Procedure Date:  03/21/2020  Pre-operative Diagnosis:  Symptomatic cholelithiasis  Post-operative Diagnosis:  Symptomatic cholelithiasis  Procedure:  Robotic assisted cholecystectomy with ICG FireFly cholangiogram  Surgeon:  Melvyn Neth, MD  Anesthesia:  General endotracheal  Estimated Blood Loss:  5 ml  Specimens:  gallbladder  Complications:  None  Indications for Procedure:  This is a 39 y.o. male who presents with abdominal pain and workup revealing symptomatic cholelithiasis.  The benefits, complications, treatment options, and expected outcomes were discussed with the patient. The risks of bleeding, infection, recurrence of symptoms, failure to resolve symptoms, bile duct damage, bile duct leak, retained common bile duct stone, bowel injury, and need for further procedures were all discussed with the patient and he was willing to proceed.  Prior to surgery, the patient was given 2.5 mg IV of ICG.  Description of Procedure: The patient was correctly identified in the preoperative area and brought into the operating room.  The patient was placed supine with VTE prophylaxis in place.  Appropriate time-outs were performed.  Anesthesia was induced and the patient was intubated.  Appropriate antibiotics were infused.  The abdomen was prepped and draped in a sterile fashion. An infraumbilical incision was made. A cutdown technique was used to enter the abdominal cavity without injury, and a 12 mm robotic trocar was inserted.  Pneumoperitoneum was obtained with appropriate opening pressures.  Three 8-mm ports were placed in the mid abdomen at the level of the umbilicus under direct visualization.  The DaVinci platform was docked, camera targeted, and instruments placed under direct visualization.  The gallbladder was identified.  The fundus was grasped and retracted cephalad.  Adhesions were lysed bluntly and with electrocautery. The infundibulum was grasped and retracted laterally,  exposing the peritoneum overlying the gallbladder.  This was incised with electrocautery and extended on either side of the gallbladder.  FireFly cholangiogram was done at this point and the gallbladder, cystic duct, and common bile duct were easily visualized.  The cystic duct and cystic artery were clearly identified and bluntly dissected.  Both were clipped twice proximally and once distally, cutting in between.  The gallbladder was taken from the gallbladder fossa in a retrograde fashion with electrocautery. The gallbladder was placed in an Endocatch bag. The liver bed was inspected and any bleeding was controlled with electrocautery. The right upper quadrant was then inspected again revealing intact clips, no bleeding, and no ductal injury.  The DaVinci platform was undocked and instruments removed.    The 8 mm ports were removed under direct visualization and the 12 mm trocar was removed.  The Endocatch bag was brought out via the umbilical incision. The fascial opening was closed using 0 vicryl suture.  Local anesthetic was infused in all incisions and the incisions were closed with 4-0 Monocryl.  The wounds were cleaned and sealed with DermaBond.  The patient was emerged from anesthesia and extubated and brought to the recovery room for further management.  The patient tolerated the procedure well and all counts were correct at the end of the case.   Melvyn Neth, MD

## 2020-03-21 NOTE — Progress Notes (Signed)
Pt tolerating PO fluids and nutrition.  VSS, NAD.

## 2020-03-21 NOTE — Discharge Instructions (Signed)

## 2020-03-21 NOTE — Anesthesia Procedure Notes (Signed)
Procedure Name: Intubation Date/Time: 03/21/2020 8:10 AM Performed by: Debe Coder, CRNA Pre-anesthesia Checklist: Patient identified, Emergency Drugs available, Suction available and Patient being monitored Patient Re-evaluated:Patient Re-evaluated prior to induction Oxygen Delivery Method: Circle system utilized Preoxygenation: Pre-oxygenation with 100% oxygen Induction Type: IV induction Ventilation: Mask ventilation without difficulty Laryngoscope Size: Mac and 2 Grade View: Grade I Tube type: Oral Tube size: 7.5 mm Number of attempts: 1 Airway Equipment and Method: Stylet and Oral airway Placement Confirmation: ETT inserted through vocal cords under direct vision,  positive ETCO2 and breath sounds checked- equal and bilateral Secured at: 23 cm Tube secured with: Tape Dental Injury: Teeth and Oropharynx as per pre-operative assessment

## 2020-03-21 NOTE — Anesthesia Preprocedure Evaluation (Signed)
Anesthesia Evaluation  Patient identified by MRN, date of birth, ID band Patient awake    Reviewed: Allergy & Precautions, H&P , NPO status , Patient's Chart, lab work & pertinent test results, reviewed documented beta blocker date and time   Airway Mallampati: II  TM Distance: >3 FB Neck ROM: full    Dental  (+) Teeth Intact   Pulmonary neg pulmonary ROS,    Pulmonary exam normal        Cardiovascular negative cardio ROS Normal cardiovascular exam Rhythm:regular Rate:Normal     Neuro/Psych negative neurological ROS  negative psych ROS   GI/Hepatic negative GI ROS, Neg liver ROS,   Endo/Other  negative endocrine ROS  Renal/GU negative Renal ROS  negative genitourinary   Musculoskeletal   Abdominal   Peds  Hematology negative hematology ROS (+)   Anesthesia Other Findings Past Medical History: No date: Chicken pox No date: Diverticulosis No date: Fatty liver No date: History of kidney stones Past Surgical History: 02/2018: bone spur; Left     Comment:  left big toe No date: KNEE ARTHROSCOPY; Left 2021: LIGAMENT REPAIR; Left     Comment:  thumb (pinned) 1999: [place]; Left BMI    Body Mass Index: 26.63 kg/m     Reproductive/Obstetrics negative OB ROS                             Anesthesia Physical Anesthesia Plan  ASA: II  Anesthesia Plan: General ETT   Post-op Pain Management:    Induction:   PONV Risk Score and Plan:   Airway Management Planned:   Additional Equipment:   Intra-op Plan:   Post-operative Plan:   Informed Consent: I have reviewed the patients History and Physical, chart, labs and discussed the procedure including the risks, benefits and alternatives for the proposed anesthesia with the patient or authorized representative who has indicated his/her understanding and acceptance.     Dental Advisory Given  Plan Discussed with:  CRNA  Anesthesia Plan Comments:         Anesthesia Quick Evaluation

## 2020-03-22 LAB — SURGICAL PATHOLOGY

## 2020-03-23 NOTE — Anesthesia Postprocedure Evaluation (Signed)
Anesthesia Post Note  Patient: Carl Moore  Procedure(s) Performed: XI ROBOTIC ASSISTED LAPAROSCOPIC CHOLECYSTECTOMY (N/A ) INDOCYANINE GREEN FLUORESCENCE IMAGING (ICG) (N/A )  Patient location during evaluation: PACU Anesthesia Type: General Level of consciousness: awake and alert Pain management: pain level controlled Vital Signs Assessment: post-procedure vital signs reviewed and stable Respiratory status: spontaneous breathing, nonlabored ventilation, respiratory function stable and patient connected to nasal cannula oxygen Cardiovascular status: blood pressure returned to baseline and stable Postop Assessment: no apparent nausea or vomiting Anesthetic complications: no   No complications documented.   Last Vitals:  Vitals:   03/21/20 1106 03/21/20 1124  BP: 134/72 (!) 125/56  Pulse: 69 68  Resp: 16 16  Temp: 36.4 C 36.6 C  SpO2: 98% 98%    Last Pain:  Vitals:   03/22/20 0833  TempSrc:   PainSc: 2                  Molli Barrows

## 2020-03-27 ENCOUNTER — Encounter: Payer: Self-pay | Admitting: Emergency Medicine

## 2020-04-04 ENCOUNTER — Encounter: Payer: 59 | Admitting: Physician Assistant

## 2020-04-07 ENCOUNTER — Other Ambulatory Visit: Payer: Self-pay

## 2020-04-07 ENCOUNTER — Encounter: Payer: Self-pay | Admitting: Surgery

## 2020-04-07 ENCOUNTER — Ambulatory Visit (INDEPENDENT_AMBULATORY_CARE_PROVIDER_SITE_OTHER): Payer: 59 | Admitting: Surgery

## 2020-04-07 VITALS — BP 126/77 | HR 84 | Temp 97.9°F | Resp 12 | Ht 66.0 in | Wt 153.0 lb

## 2020-04-07 DIAGNOSIS — Z09 Encounter for follow-up examination after completed treatment for conditions other than malignant neoplasm: Secondary | ICD-10-CM

## 2020-04-07 DIAGNOSIS — K8020 Calculus of gallbladder without cholecystitis without obstruction: Secondary | ICD-10-CM

## 2020-04-07 NOTE — Patient Instructions (Signed)
Follow up as needed, call the office if you have any questions or concerns.  GENERAL POST-OPERATIVE PATIENT INSTRUCTIONS   WOUND CARE INSTRUCTIONS:  Keep a dry clean dressing on the wound if there is drainage. The initial bandage may be removed after 24 hours.  Once the wound has quit draining you may leave it open to air.  If clothing rubs against the wound or causes irritation and the wound is not draining you may cover it with a dry dressing during the daytime.  Try to keep the wound dry and avoid ointments on the wound unless directed to do so.  If the wound becomes bright red and painful or starts to drain infected material that is not clear, please contact your physician immediately.  If the wound is mildly pink and has a thick firm ridge underneath it, this is normal, and is referred to as a healing ridge.  This will resolve over the next 4-6 weeks.  BATHING: You may shower if you have been informed of this by your surgeon. However, Please do not submerge in a tub, hot tub, or pool until incisions are completely sealed or have been told by your surgeon that you may do so.  DIET:  You may eat any foods that you can tolerate.  It is a good idea to eat a high fiber diet and take in plenty of fluids to prevent constipation.  If you do become constipated you may want to take a mild laxative or take ducolax tablets on a daily basis until your bowel habits are regular.  Constipation can be very uncomfortable, along with straining, after recent surgery.  ACTIVITY:  You are encouraged to cough and deep breath or use your incentive spirometer if you were given one, every 15-30 minutes when awake.  This will help prevent respiratory complications and low grade fevers post-operatively if you had a general anesthetic.  You may want to hug a pillow when coughing and sneezing to add additional support to the surgical area, if you had abdominal or chest surgery, which will decrease pain during these times.  You  are encouraged to walk and engage in light activity for the next two weeks.  You should not lift more than 10-15 pounds, 4 to 6 weeks after surgery as it could put you at increased risk for complications.  Twenty pounds is roughly equivalent to a plastic bag of groceries. At that time- Listen to your body when lifting, if you have pain when lifting, stop and then try again in a few days. Soreness after doing exercises or activities of daily living is normal as you get back in to your normal routine.  MEDICATIONS:  Try to take narcotic medications and anti-inflammatory medications, such as tylenol, ibuprofen, naprosyn, etc., with food.  This will minimize stomach upset from the medication.  Should you develop nausea and vomiting from the pain medication, or develop a rash, please discontinue the medication and contact your physician.  You should not drive, make important decisions, or operate machinery when taking narcotic pain medication.  SUNBLOCK Use sun block to incision area over the next year if this area will be exposed to sun. This helps decrease scarring and will allow you avoid a permanent darkened area over your incision.  QUESTIONS:  Please feel free to call our office if you have any questions, and we will be glad to assist you.

## 2020-04-07 NOTE — Progress Notes (Signed)
04/07/2020  HPI: Carl Moore is a 39 y.o. male s/p robotic cholecystectomy on 03/21/20.  Patient reports doing well, without nausea, vomiting, or pain.  Tolerating diet well with normal bowel function.  Vital signs: BP 126/77   Pulse 84   Temp 97.9 F (36.6 C)   Resp 12   Ht 5\' 6"  (1.676 m)   Wt 153 lb (69.4 kg)   SpO2 98%   BMI 24.69 kg/m    Physical Exam: Constitutional: No acute distress Abdomen:  Soft, non-distended, non-tender to palpation.  Incisions healing well, clean, dry, intact.  Assessment/Plan: This is a 39 y.o. male s/p robotic assisted cholecystectomy.  --Patient healing well.  No diet restrictions. --Reviewed pathology with patient -- chronic cholecystitis. --Still has until 9/21 of no heavy lifting/pushing restrictions.  May resume normal activities afterwards. --Follow up prn.   Melvyn Neth, [facility]

## 2020-04-14 ENCOUNTER — Encounter: Payer: 59 | Admitting: Physician Assistant

## 2020-04-19 ENCOUNTER — Telehealth: Payer: Self-pay | Admitting: *Deleted

## 2020-04-19 ENCOUNTER — Other Ambulatory Visit: Payer: Self-pay | Admitting: Primary Care

## 2020-04-19 DIAGNOSIS — G4700 Insomnia, unspecified: Secondary | ICD-10-CM

## 2020-04-19 NOTE — Telephone Encounter (Signed)
Patient called and stated that he had surgery with Dr Hampton Abbot on 03/21/20 gallbladder removed and he is not having any pain and everything has been going good expect today he feels like he just got done with a ab workout. That is how the patient could best explain it. Please call patient and advise

## 2020-04-20 NOTE — Telephone Encounter (Signed)
Left detailed message for patient to try Tylenol or Ibuprofen to help with pain management. Advised patient to refrain from any heavy lifting, bending, pushing or pulling for a total of six weeks after surgery. Advised patient to give our office a call back if the pain does not subside or worsens.

## 2020-04-21 NOTE — Telephone Encounter (Signed)
Yes, as long as their CPE/annual follow up are UTD. I refilled it. Thanks!

## 2020-04-21 NOTE — Telephone Encounter (Signed)
Are you ok with me sending refill on trazodone if seen recently and prescribed by you in the past?

## 2020-05-21 ENCOUNTER — Other Ambulatory Visit: Payer: Self-pay | Admitting: Family Medicine

## 2020-05-21 DIAGNOSIS — G8929 Other chronic pain: Secondary | ICD-10-CM

## 2020-05-22 NOTE — Telephone Encounter (Signed)
Is he taking this for his shoulder still? Did he follow up with Dr. Lorelei Pont for an injection?

## 2020-05-22 NOTE — Telephone Encounter (Signed)
Last office visit 03/06/2020 with Dr. Darnell Level for continuous RUQ abd pain/Gallstones.  Last refilled 02/10/2020 for #30 with 2 refills by Dr Lorelei Pont.  No future appointments.

## 2020-05-25 ENCOUNTER — Telehealth: Payer: Self-pay | Admitting: Primary Care

## 2020-05-25 ENCOUNTER — Other Ambulatory Visit: Payer: Self-pay | Admitting: Surgery

## 2020-05-25 NOTE — Telephone Encounter (Signed)
Called patient made virtual  with Anda Kraft to talk about meds.

## 2020-05-25 NOTE — Telephone Encounter (Signed)
Pt called he is currently taking 100mg  oif trazodone and this had not been helping him sleep this week and wanted to know if there was something else he could do  [facility] [address]

## 2020-05-27 NOTE — Telephone Encounter (Signed)
Refills sent to pharmacy. 

## 2020-05-27 NOTE — Telephone Encounter (Signed)
Did get injection with Dr. Lorelei Pont and It helped a lot. Is still taking for shoulder on as needed only. Takes one tab on average 3-4 times a week.

## 2020-05-30 ENCOUNTER — Encounter: Payer: 59 | Admitting: Primary Care

## 2020-05-30 ENCOUNTER — Telehealth: Payer: Self-pay | Admitting: Primary Care

## 2020-05-30 ENCOUNTER — Other Ambulatory Visit: Payer: Self-pay

## 2020-05-30 NOTE — Telephone Encounter (Signed)
Done

## 2020-05-30 NOTE — Telephone Encounter (Signed)
Pt called in due to Saturday he ran a fever of 101 and his whole body is fatigued and wanted to know if he can be set up for covid test.

## 2020-05-30 NOTE — Telephone Encounter (Signed)
Noted. Please remove him from the schedule.

## 2020-05-30 NOTE — Telephone Encounter (Signed)
Called patient back. He had virtual with you this am and we were not able to get in touch with him. States he thought it was yesterday he waited around all day for [place] to call. Let him know that in order to order a covid test we would need to do virtual. While trying to pull up everything to get scheduled he said he will not waste my time he can not do virtual will just go for rapid test and disconnected the call.

## 2020-05-31 ENCOUNTER — Ambulatory Visit: Payer: 59

## 2020-05-31 NOTE — Progress Notes (Signed)
Patient never seen °

## 2020-06-01 ENCOUNTER — Encounter: Payer: Self-pay | Admitting: Primary Care

## 2020-06-01 ENCOUNTER — Other Ambulatory Visit: Payer: Self-pay

## 2020-06-01 ENCOUNTER — Telehealth (INDEPENDENT_AMBULATORY_CARE_PROVIDER_SITE_OTHER): Payer: 59 | Admitting: Primary Care

## 2020-06-01 DIAGNOSIS — M25512 Pain in left shoulder: Secondary | ICD-10-CM | POA: Diagnosis not present

## 2020-06-01 DIAGNOSIS — G8929 Other chronic pain: Secondary | ICD-10-CM | POA: Diagnosis not present

## 2020-06-01 DIAGNOSIS — G4700 Insomnia, unspecified: Secondary | ICD-10-CM

## 2020-06-01 MED ORDER — TRAZODONE HCL 100 MG PO TABS: 150.0000 mg | ORAL_TABLET | Freq: Every evening | ORAL | 0 refills | Status: DC | PRN

## 2020-06-01 NOTE — Progress Notes (Signed)
Subjective:    Patient ID: Carl Moore, male    DOB: Apr 20, 1981, 39 y.o.   MRN: [id number]  HPI  Virtual Visit via Video Note  I connected with Carl Moore on 06/01/20 at  9:00 AM EDT by a video enabled telemedicine application and verified that I am speaking with the correct person using two identifiers.  Location: Patient: Work Provider: Office Participants: Patient and myself   I discussed the limitations of evaluation and management by telemedicine and the availability of in person appointments. The patient expressed understanding and agreed to proceed.  History of Present Illness:  Carl Moore is a 39 year old male with a history of insomnia, chronic bilateral shoulder pain, insomnia who presents today to discuss sleep disturbance.   Currently managed on Trazodone 100 mg for which he takes nightly. Overall has done well on this regimen except for over the last two weeks. Over the last two weeks he's noticed waking with mind racing thoughts, is tossing and turning at night, not sleeping well.   He's felt fatigue, chest congestion, low grade fever over the last week. He was tested for Covid-19 which was negative. He's feeling better from these symptoms.  He typically takes the Flexeril at bedtime along with Trazodone due to chronic bilateral shoulder pain. He underwent a steroid injection to the right shoulder and has noticed significant improvement. About two weeks ago he started to notice increased pain to his left shoulder.   He does drink a lot of liquids prior to bedtime, will wake at times to use the bathroom. He denies daily anxiety symptoms, increased stress.   Observations/Objective:  Alert and oriented. Appears well, not sickly. No distress. Speaking in complete sentences.   Assessment and Plan:  See problem based charting.  Follow Up Instructions:  Increase your trazodone to 1 and 1/2 tablet, 150 mg, nightly for sleep.  Please set up an  appointment with Dr.Copland for your shoulder.  Update me next week as discussed.  It was a pleasure to see you today! [name], NP-C    I discussed the assessment and treatment plan with the patient. The patient was provided an opportunity to ask questions and all were answered. The patient agreed with the plan and demonstrated an understanding of the instructions.   The patient was advised to call back or seek an in-person evaluation if the symptoms worsen or if the condition fails to improve as anticipated.    Pleas [name], NP    Review of Systems  Constitutional: Positive for fatigue.  Musculoskeletal: Positive for arthralgias.  Psychiatric/Behavioral: Positive for sleep disturbance. The patient is not nervous/anxious.        Past Medical History:  Diagnosis Date  . Chicken pox   . Diverticulosis   . Fatty liver   . History of kidney stones      Social History   Socioeconomic History  . Marital status: Married    Spouse name: Not on file  . Number of children: Not on file  . Years of education: Not on file  . Highest education level: Not on file  Occupational History  . Not on file  Tobacco Use  . Smoking status: Never Smoker  . Smokeless tobacco: Never Used  Vaping Use  . Vaping Use: Never used  Substance and Sexual Activity  . Alcohol use: No    Alcohol/week: 0.0 standard drinks  . Drug use: No  . Sexual activity: Yes  Other Topics Concern  .  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   No children.   Enjoys playing golf.   Social Determinants of Health   Financial Resource Strain:   . Difficulty of Paying Living Expenses: Not on file  Food Insecurity:   . Worried About Charity fundraiser in the Last Year: Not on file  . Ran Out of Food in the Last Year: Not on file  Transportation Needs:   . Lack of Transportation (Medical): Not on file  . Lack of Transportation (Non-Medical): Not on file    Physical Activity:   . Days of Exercise per Week: Not on file  . Minutes of Exercise per Session: Not on file  Stress:   . Feeling of Stress : Not on file  Social Connections:   . Frequency of Communication with Friends and Family: Not on file  . Frequency of Social Gatherings with Friends and Family: Not on file  . Attends Religious Services: Not on file  . Active Member of Clubs or Organizations: Not on file  . Attends Archivist Meetings: Not on file  . Marital Status: Not on file  Intimate Partner Violence:   . Fear of Current or Ex-Partner: Not on file  . Emotionally Abused: Not on file  . Physically Abused: Not on file  . Sexually Abused: Not on file    Past Surgical History:  Procedure Laterality Date  . bone spur Left 02/2018   left big toe  . KNEE ARTHROSCOPY Left   . LIGAMENT REPAIR Left 2021   thumb (pinned)  . ULNAR NERVE REPAIR Left 1999    Family History  Problem Relation Age of Onset  . Hypertension Mother   . Stroke Father        Deceased  . Hypertension Father     No Known Allergies  Current Outpatient Medications on File Prior to Visit  Medication Sig Dispense Refill  . cetirizine (ZYRTEC) 10 MG tablet Take 10 mg by mouth daily as needed for allergies. Seasonally    . cyclobenzaprine (FLEXERIL) 10 MG tablet TAKE 1/2 TO 1 (ONE-HALF TO ONE) TABLET BY MOUTH AT BEDTIME AS NEEDED FOR MUSCLE SPASM 30 tablet 0  . ibuprofen (ADVIL) 600 MG tablet Take 1 tablet (600 mg total) by mouth every 8 (eight) hours as needed for mild pain or moderate pain. 30 tablet 1  . traZODone (DESYREL) 100 MG tablet TAKE 1 TABLET BY MOUTH AT BEDTIME AS NEEDED FOR SLEEP 90 tablet 0   No current facility-administered medications on file prior to visit.    Ht 5\' 6"  (1.676 m)   Wt 165 lb (74.8 kg)   BMI 26.63 kg/m    Objective:   Physical Exam Pulmonary:     Effort: Pulmonary effort is normal.  Neurological:     Mental Status: He is alert and oriented to person,  place, and time.  Psychiatric:        Mood and Affect: Mood normal.            Assessment & Plan:

## 2020-06-01 NOTE — Assessment & Plan Note (Signed)
Increased pain over last 2 weeks which I suspect is contributing to his sleep disturbance.  Recommended that he set up a visit with Dr. Lorelei Pont for further evaluation. Continue Flexeril HS for now.

## 2020-06-01 NOTE — Assessment & Plan Note (Signed)
Deteriorated over the last two weeks which seems to be due to his increased left shoulder pain. He denies anxiety/increased stress.  Recommended he see Dr. Lorelei Pont for evaluation of left shoulder. Suspect sleep with improve with treatment.  In the meantime, we will increase his Trazodone to 150 mg. He will update next week.

## 2020-06-01 NOTE — Patient Instructions (Signed)
  Increase your trazodone to 1 and 1/2 tablet, 150 mg, nightly for sleep.  Please set up an appointment with Dr.Copland for your shoulder.  Update me next week as discussed.  It was a pleasure to see you today! [name], NP-C

## 2020-06-05 ENCOUNTER — Ambulatory Visit: Payer: 59 | Admitting: Family Medicine

## 2020-06-08 ENCOUNTER — Ambulatory Visit: Payer: 59 | Admitting: Primary Care

## 2020-06-09 DIAGNOSIS — G4700 Insomnia, unspecified: Secondary | ICD-10-CM

## 2020-06-09 MED ORDER — TRAZODONE HCL 150 MG PO TABS: 150.0000 mg | ORAL_TABLET | Freq: Every day | ORAL | 0 refills | Status: DC

## 2020-06-13 NOTE — Progress Notes (Signed)
Carl Moore T. Carl Olivera, MD, [facility] [place] at [facility] [place], [address]  Phone: [phone]  FAX: [place] - 39 y.o. male  MRN [id number]  Date of Birth: Jan 29, 1981  Date: 06/14/2020  PCP: Pleas [name], NP  Referral: Pleas [name], NP  Chief Complaint  Patient presents with  . Shoulder Pain    Left    This visit occurred during the SARS-CoV-2 public health emergency.  Safety protocols were in place, including screening questions prior to the visit, additional usage of staff PPE, and extensive cleaning of exam room while observing appropriate contact time as indicated for disinfecting solutions.   Subjective:   Carl Moore is a 39 y.o. very pleasant male patient with Body mass index is 25.99 kg/m. who presents with the following:  He presents with some ongoing chronic left shoulder pain.  He has been taking some Flexeril as well as some trazodone for his bilateral shoulder pain to help with sleep.  I did see him distantly with some right-sided shoulder pain.  He responded well to an intra-articular injection of his right shoulder in July 2021.  He is recently had a flareup of his left shoulder.  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  He ultimately sought [facility] for 4 years ago and reports to me that he did have a partial-thickness rotator cuff tear.  He did get a injection at that point, and he did think that it helped.  He does go to the gym and works out essentially every day.  He is on his feet all the time when he is in the workplace.  I did do a right-sided intra-articular injection on him for 5 months ago, and he had a good relief of symptoms from that.   Review of Systems is noted in the HPI, as appropriate   Objective:   BP  124/80   Pulse 89   Temp 98.2 F (36.8 C) (Temporal)   Ht 5\' 6"  (1.676 m)   Wt 161 lb (73 kg)   SpO2 97%   BMI 25.99 kg/m    Shoulder: L Inspection: No muscle wasting or winging Ecchymosis/edema: neg  AC joint, scapula, clavicle: NT Cervical spine: NT, full ROM Spurling's: neg Abduction: full, 5/5 Flexion: full, 5/5 IR, full, lift-off: 5/5 ER at neutral: full, 5/5 Some crepitus with motion AC crossover: neg Neer: pos Hawkins: pos Drop Test: neg Empty Can: pos Supraspinatus insertion: mild-mod T Bicipital groove: NT Speed's: neg Yergason's: neg Sulcus sign: neg Scapular dyskinesis: none C5-T1 intact  Neuro: Sensation intact Grip 5/5   Radiology: No results found.  Assessment and Plan:     ICD-10-CM   1. Chronic left shoulder pain  M25.512 triamcinolone acetonide (KENALOG-40) injection 40 mg   G89.29    Chronic left shoulder pain with some mild impingement symptoms today.  Failure of conservative measures thus far, so I am going to do a shoulder injection today to see if this provides him some relief.  Intraarticular Shoulder Aspiration/Injection Procedure Note Carl Moore 03/16/1981 Date of procedure: 06/14/2020  Procedure: Large Joint Aspiration / Injection of Shoulder, Intraarticular, L Indications: Pain  Procedure Details Verbal consent was obtained from the patient. Risks explained and contrasted with benefits and alternatives. Patient prepped with Chloraprep and Ethyl Chloride  used for anesthesia. An intraarticular shoulder injection was performed using the posterior approach; needle placed into joint capsule without difficulty. The patient tolerated the procedure well and had decreased pain post injection. No complications. Injection: 9 cc of Lidocaine 1% and 1 mL Kenalog 40 mg. Needle: 21 gauge, 2 inch   Meds ordered this encounter  Medications  . triamcinolone acetonide (KENALOG-40) injection 40 mg   Follow-up: As needed  only  Signed,  Ziyonna Christner T. Franciscojavier Wronski, MD   Outpatient Encounter Medications as of 06/14/2020  Medication Sig  . cetirizine (ZYRTEC) 10 MG tablet Take 10 mg by mouth daily as needed for allergies. Seasonally  . cyclobenzaprine (FLEXERIL) 10 MG tablet TAKE 1/2 TO 1 (ONE-HALF TO ONE) TABLET BY MOUTH AT BEDTIME AS NEEDED FOR MUSCLE SPASM  . traZODone (DESYREL) 150 MG tablet Take 1 tablet (150 mg total) by mouth at bedtime. For sleep.  . [DISCONTINUED] ibuprofen (ADVIL) 600 MG tablet Take 1 tablet (600 mg total) by mouth every 8 (eight) hours as needed for mild pain or moderate pain.  . [EXPIRED] triamcinolone acetonide (KENALOG-40) injection 40 mg    No facility-administered encounter medications on file as of 06/14/2020.

## 2020-06-14 ENCOUNTER — Other Ambulatory Visit: Payer: Self-pay

## 2020-06-14 ENCOUNTER — Encounter: Payer: Self-pay | Admitting: Family Medicine

## 2020-06-14 ENCOUNTER — Ambulatory Visit (INDEPENDENT_AMBULATORY_CARE_PROVIDER_SITE_OTHER): Payer: 59 | Admitting: Family Medicine

## 2020-06-14 VITALS — BP 124/80 | HR 89 | Temp 98.2°F | Ht 66.0 in | Wt 161.0 lb

## 2020-06-14 DIAGNOSIS — G8929 Other chronic pain: Secondary | ICD-10-CM

## 2020-06-14 DIAGNOSIS — M25512 Pain in left shoulder: Secondary | ICD-10-CM | POA: Diagnosis not present

## 2020-06-14 MED ORDER — TRIAMCINOLONE ACETONIDE 40 MG/ML IJ SUSP
40.0000 mg | Freq: Once | INTRAMUSCULAR | Status: AC
  Administered 2020-06-14: 40 mg via INTRA_ARTICULAR

## 2020-06-25 ENCOUNTER — Other Ambulatory Visit: Payer: Self-pay | Admitting: Primary Care

## 2020-06-25 DIAGNOSIS — G8929 Other chronic pain: Secondary | ICD-10-CM

## 2020-06-26 NOTE — Telephone Encounter (Signed)
Refills sent to pharmacy. 

## 2020-06-26 NOTE — Telephone Encounter (Signed)
Pharmacy requests refill on: Cyclobenzaprine HCL 10 mg   LAST REFILL: 05/27/2020 (Q-30, R-0) LAST OV: 06/14/2020 NEXT OV: Not Scheduled  PHARMACY: [id number] [place], [place]

## 2020-07-24 ENCOUNTER — Other Ambulatory Visit: Payer: Self-pay | Admitting: Primary Care

## 2020-07-24 DIAGNOSIS — M25511 Pain in right shoulder: Secondary | ICD-10-CM

## 2020-07-24 DIAGNOSIS — G8929 Other chronic pain: Secondary | ICD-10-CM

## 2020-09-08 IMAGING — DX DG HAND COMPLETE 3+V*L*
3 series · 3 of 3 positions shown · non-contrast
Comparison: 02/13/2018.

CLINICAL DATA: Crush injury left hand

EXAM:
LEFT HAND - COMPLETE 3+ VIEW

[hand pa]
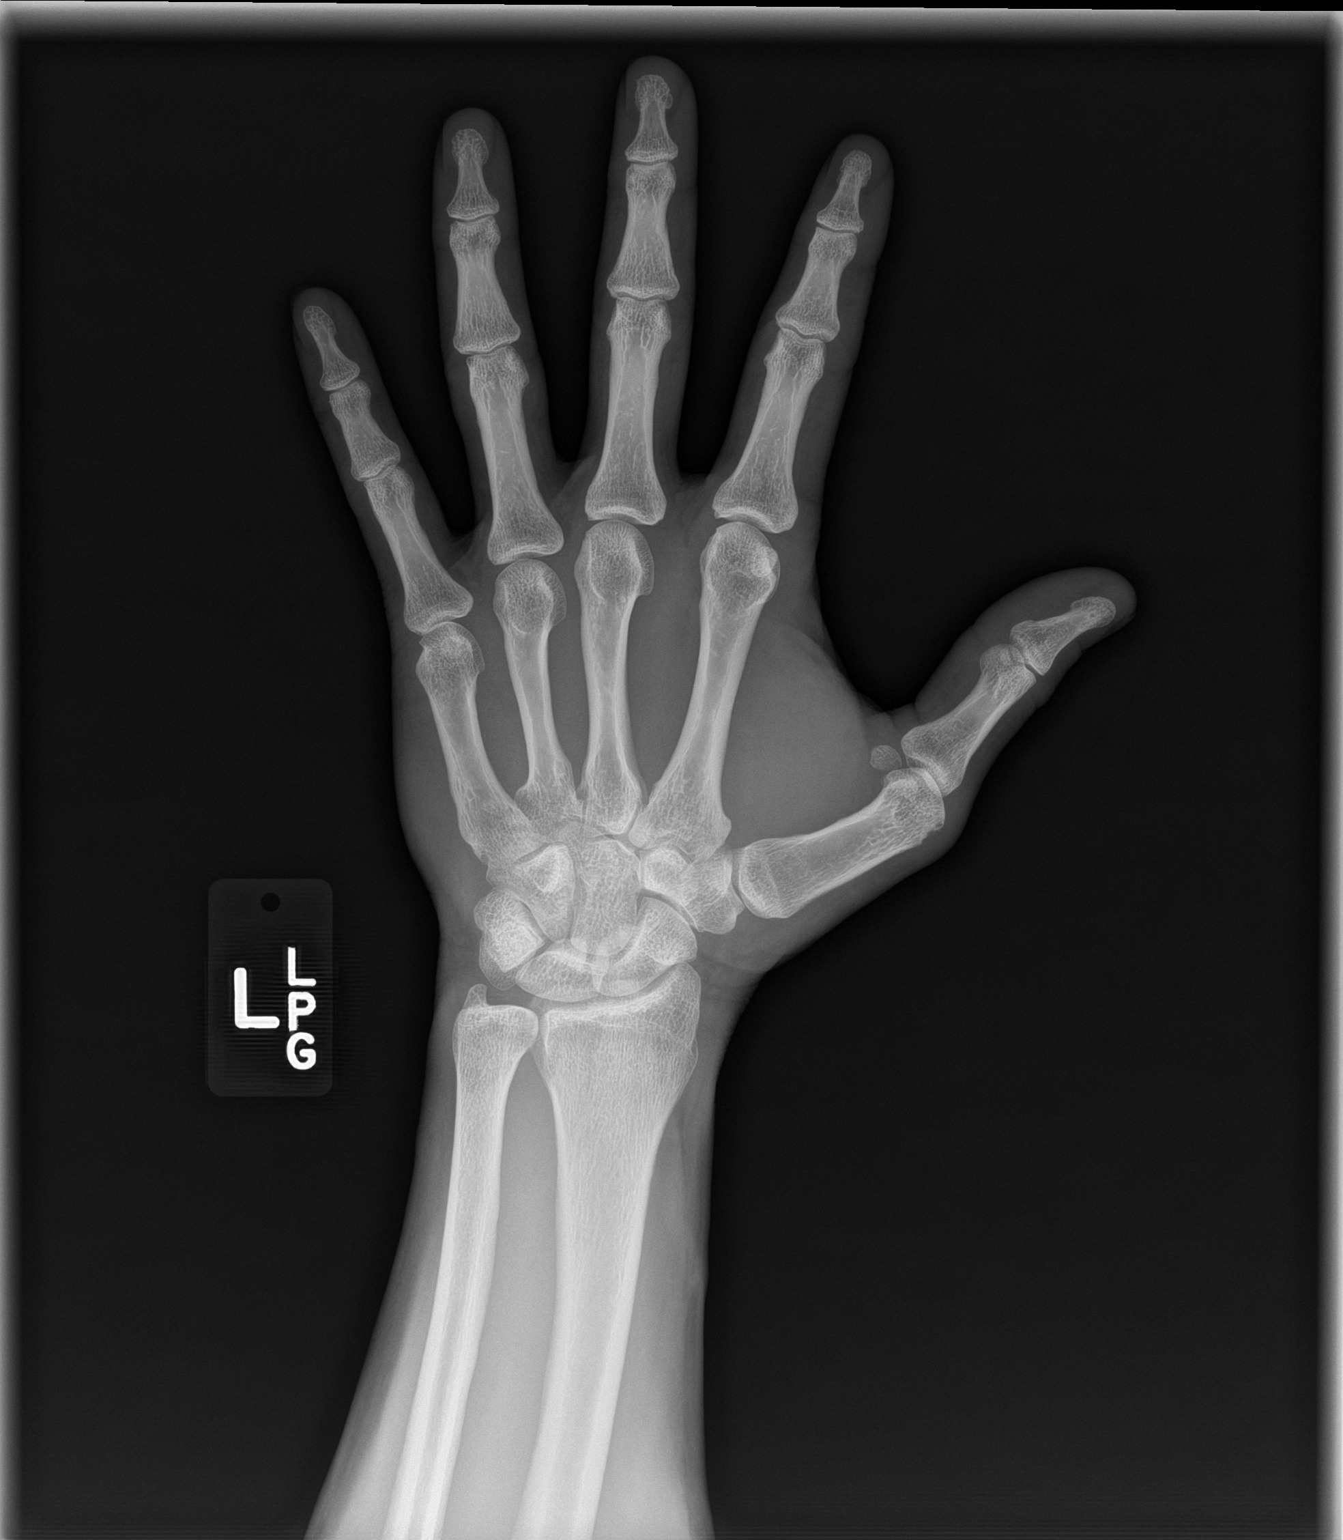

[hand obl]
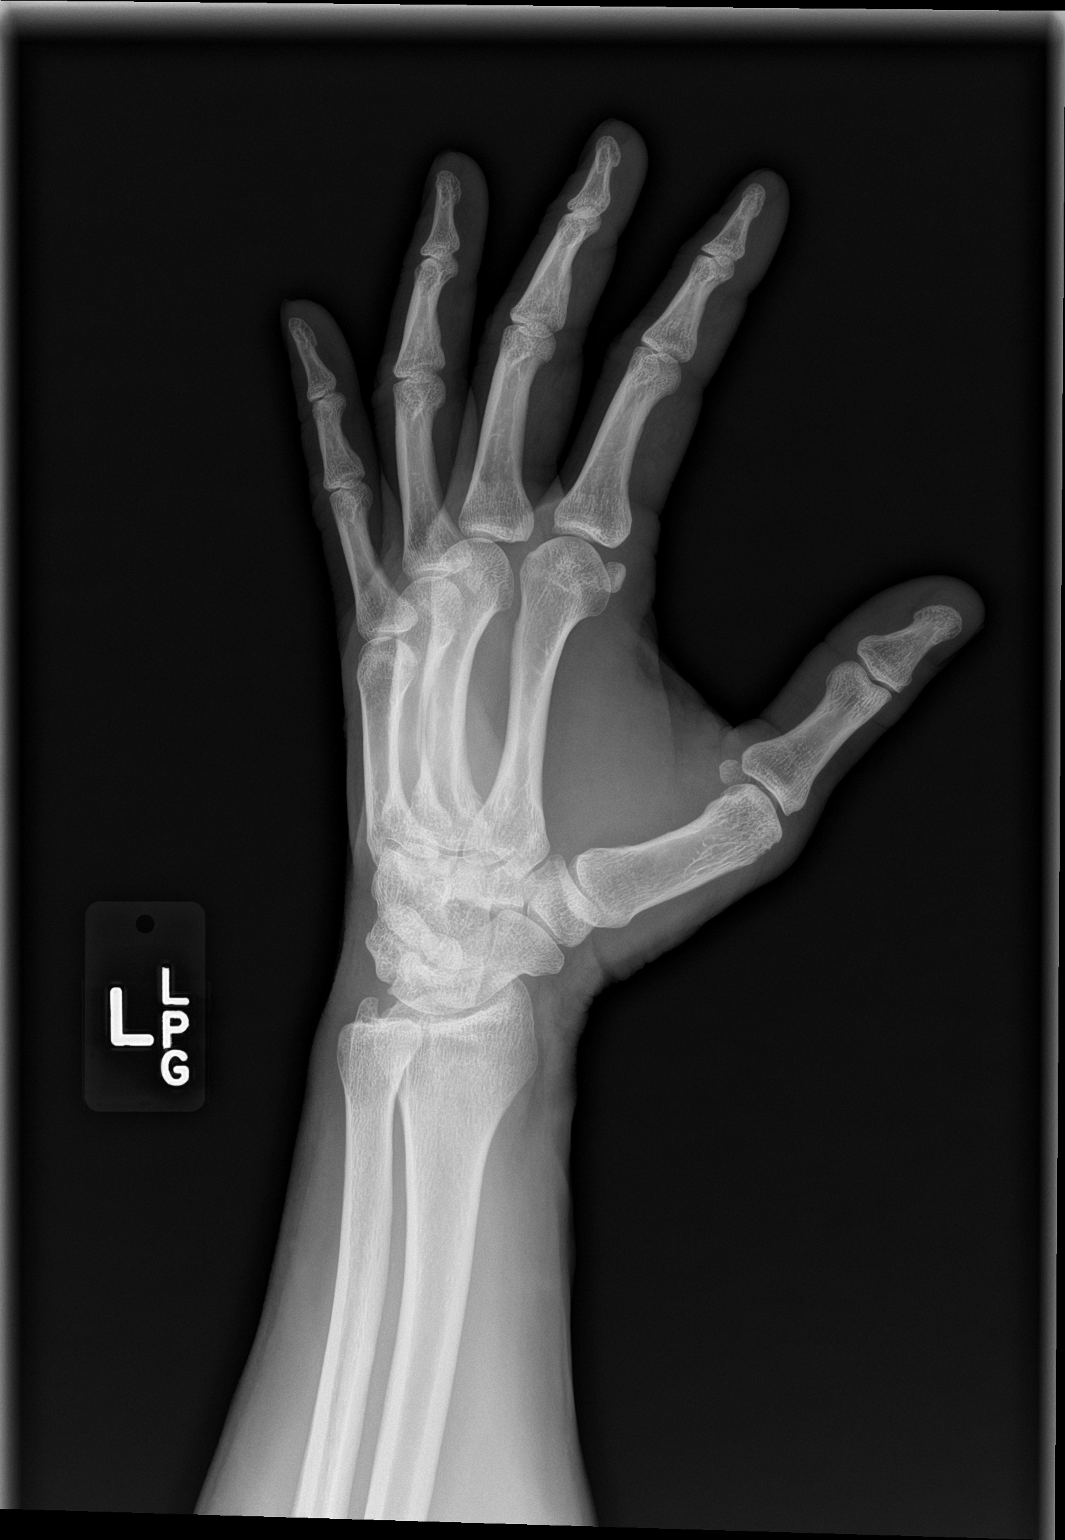

[hand lat]
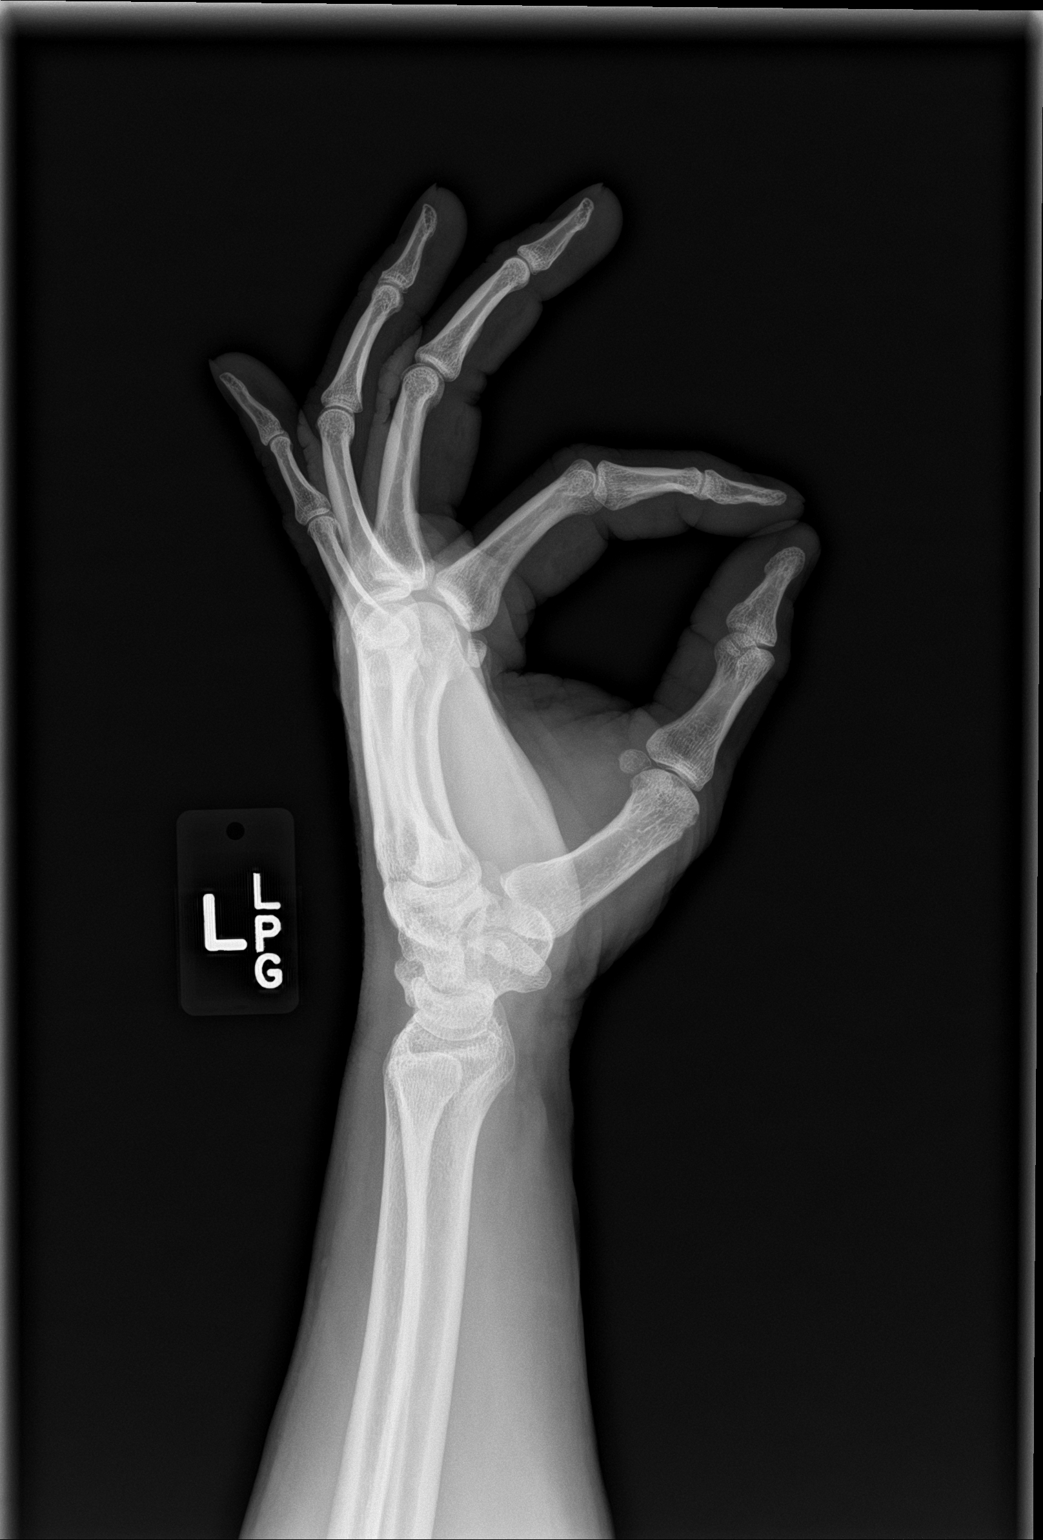

[3 of 3 positions shown; findings below may reference images not displayed]

FINDINGS: There is no evidence of fracture or dislocation. There is no
evidence of arthropathy or other focal bone abnormality. Soft
tissues are unremarkable.
IMPRESSION: No acute or focal abnormality identified.

## 2020-09-13 ENCOUNTER — Ambulatory Visit: Payer: 59 | Admitting: Primary Care

## 2020-09-13 ENCOUNTER — Other Ambulatory Visit: Payer: Self-pay

## 2020-09-13 ENCOUNTER — Encounter: Payer: Self-pay | Admitting: Primary Care

## 2020-09-13 VITALS — BP 142/78 | HR 71 | Temp 98.7°F | Ht 66.0 in | Wt 171.0 lb

## 2020-09-13 DIAGNOSIS — G4700 Insomnia, unspecified: Secondary | ICD-10-CM | POA: Diagnosis not present

## 2020-09-13 DIAGNOSIS — R030 Elevated blood-pressure reading, without diagnosis of hypertension: Secondary | ICD-10-CM | POA: Diagnosis not present

## 2020-09-13 DIAGNOSIS — F411 Generalized anxiety disorder: Secondary | ICD-10-CM | POA: Diagnosis not present

## 2020-09-13 MED ORDER — CITALOPRAM HYDROBROMIDE 20 MG PO TABS: 20.0000 mg | ORAL_TABLET | Freq: Every day | ORAL | 1 refills | Status: DC

## 2020-09-13 MED ORDER — TRAZODONE HCL 100 MG PO TABS: 100.0000 mg | ORAL_TABLET | Freq: Every day | ORAL | 0 refills | Status: DC

## 2020-09-13 NOTE — Progress Notes (Signed)
Subjective:    Patient ID: Carl Moore, male    DOB: May 01, 1981, 39 y.o.   MRN: [id number]  HPI  This visit occurred during the SARS-CoV-2 public health emergency.  Safety protocols were in place, including screening questions prior to the visit, additional usage of staff PPE, and extensive cleaning of exam room while observing appropriate contact time as indicated for disinfecting solutions.   Carl Moore is a 40 year old male with a history of sleep disturbance, chronic left shoulder pain who presents today with a chief complaint of sleep disturbance.  Currently managed on Trazodone 150 mg for sleep and is continuing to struggle with consistent sleep. He is sleeping approximately 6 hours nightly, waking 1-3 times nightly with mind racing thoughts, cannot fall back asleep.  Chronic inability to fall asleep due to mind racing thoughts, "mind never shuts down". During the day and evening he's feeling "on edge", irritable, anxious/nervous, worrying. Intermittent symptoms for the last 6+ months, worse over the last few months.   GAD 7 : Generalized Anxiety Score 09/13/2020  Nervous, Anxious, on Edge 3  Control/stop worrying 2  Worry too much - different things 3  Trouble relaxing 3  Restless 3  Easily annoyed or irritable 1  Afraid - awful might happen 0  Total GAD 7 Score 15  Anxiety Difficulty Somewhat difficult    PHQ9 SCORE ONLY 09/13/2020 06/01/2020 08/27/2018  PHQ-9 Total Score 8 6 5      Review of Systems  Respiratory: Negative for shortness of breath.   Cardiovascular: Negative for chest pain and palpitations.  Psychiatric/Behavioral: Positive for sleep disturbance. The patient is nervous/anxious.        Past Medical History:  Diagnosis Date  . Chicken pox   . Diverticulosis   . Fatty liver   . History of kidney stones      Social History   Socioeconomic History  . Marital status: Married    Spouse name: Not on file  . Number of children: Not on file  .  Years of education: Not on file  . Highest education level: Not on file  Occupational History  . Not on file  Tobacco Use  . Smoking status: Never Smoker  . Smokeless tobacco: Never Used  Vaping Use  . Vaping Use: Never used  Substance and Sexual Activity  . Alcohol use: No    Alcohol/week: 0.0 standard drinks  . Drug use: No  . Sexual activity: Yes  Other Topics Concern  . 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   No children.   Enjoys playing golf.   Social Determinants of Health   Financial Resource Strain: Not on file  Food Insecurity: Not on file  Transportation Needs: Not on file  Physical Activity: Not on file  Stress: Not on file  Social Connections: Not on file  Intimate Partner Violence: Not on file    Past Surgical History:  Procedure Laterality Date  . bone spur Left 02/2018   left big toe  . KNEE ARTHROSCOPY Left   . LIGAMENT REPAIR Left 2021   thumb (pinned)  . ULNAR NERVE REPAIR Left 1999    Family History  Problem Relation Age of Onset  . Hypertension Mother   . Stroke Father        Deceased  . Hypertension Father     No Known Allergies  Current Outpatient Medications on File Prior to Visit  Medication Sig Dispense  Refill  . cetirizine (ZYRTEC) 10 MG tablet Take 10 mg by mouth daily as needed for allergies. Seasonally     No current facility-administered medications on file prior to visit.    BP (!) 155/88   Pulse 71   Temp 98.7 F (37.1 C) (Temporal)   Ht 5\' 6"  (1.676 m)   Wt 171 lb (77.6 kg)   SpO2 98%   BMI 27.60 kg/m    Objective:   Physical Exam Constitutional:      Appearance: He is well-nourished.  Cardiovascular:     Rate and Rhythm: Normal rate and regular rhythm.  Pulmonary:     Effort: Pulmonary effort is normal.     Breath sounds: Normal breath sounds.  Musculoskeletal:     Cervical back: Neck supple.  Skin:    General: Skin is warm and dry.  Psychiatric:         Mood and Affect: Mood and affect and mood normal.            Assessment & Plan:

## 2020-09-13 NOTE — Assessment & Plan Note (Signed)
Above goal in the office today, improved gradually upon end of visit. Suspected to be secondary to anxiety, will continue to monitor.

## 2020-09-13 NOTE — Assessment & Plan Note (Signed)
Chronic, increased over the last several months.  GAD 7 score of 15 today.  Discussed options for treatment, he opts for both therapy and medication. Referral placed for therapy. Rx for citalopram 20 mg sent to pharmacy.  Patient is to take 1/2 tablet daily for 8 days, then advance to 1 full tablet thereafter. We discussed possible side effects of headache, GI upset, drowsiness, and SI/HI. If thoughts of SI/HI develop, we discussed to present to the emergency immediately. Patient verbalized understanding.   Follow up in 6 weeks for re-evaluation.

## 2020-09-13 NOTE — Patient Instructions (Signed)
We reduced your dose of Trazodone to 100 mg. Take 1/2 or 1 tablet by mouth at bedtime as needed for sleep.  Start citalopram (Celexa) 20 mg for anxiety. Take 1/2 tablet daily for about one week, then increase to 1 full tablet thereafter.  Please schedule a follow up visit for 6 weeks for follow up of anxiety.  It was a pleasure to see you today!

## 2020-09-13 NOTE — Assessment & Plan Note (Signed)
Sleep disturbance despite Trazodone 150 mg, secondary to anxiety. Will work on treating anxiety, reduce Trazodone to 100 mg.

## 2020-10-12 ENCOUNTER — Telehealth (INDEPENDENT_AMBULATORY_CARE_PROVIDER_SITE_OTHER): Payer: 59 | Admitting: Family Medicine

## 2020-10-12 DIAGNOSIS — R0981 Nasal congestion: Secondary | ICD-10-CM

## 2020-10-12 DIAGNOSIS — R059 Cough, unspecified: Secondary | ICD-10-CM | POA: Diagnosis not present

## 2020-10-12 DIAGNOSIS — J029 Acute pharyngitis, unspecified: Secondary | ICD-10-CM | POA: Diagnosis not present

## 2020-10-12 MED ORDER — BENZONATATE 100 MG PO CAPS: 100.0000 mg | ORAL_CAPSULE | Freq: Three times a day (TID) | ORAL | 0 refills | Status: DC | PRN

## 2020-10-12 NOTE — Progress Notes (Signed)
Virtual Visit via Video Note  I connected with Carl Moore  on 10/12/20 at 10:40 AM EDT by a video enabled telemedicine application and verified that I am speaking with the correct person using two identifiers.  Location patient: home, [place] Location provider:work or home office Persons participating in the virtual visit: patient, provider  I discussed the limitations of evaluation and management by telemedicine and the availability of in person appointments. The patient expressed understanding and agreed to proceed.   HPI:  Acute telemedicine visit for sore throat: -Onset: 3-4 days ago -Symptoms include: sore throat, nasal congestion, cough, body aches, loose stools initially -Denies:SOB, CP, NVD, fevers, inability to eat/drin/get out of bed -his daughter had flu recently -Has tried:cough drops -Pertinent past medical history:denies -Pertinent medication allergies:nkda -COVID-19 vaccine status: has had 3 doses of covid vaccines; not flu shot  ROS: See pertinent positives and negatives per HPI.  Past Medical History:  Diagnosis Date  . Chicken pox   . Diverticulosis   . Fatty liver   . History of kidney stones     Past Surgical History:  Procedure Laterality Date  . bone spur Left 02/2018   left big toe  . KNEE ARTHROSCOPY Left   . LIGAMENT REPAIR Left 2021   thumb (pinned)  . ULNAR NERVE REPAIR Left 1999     Current Outpatient Medications:  .  benzonatate (TESSALON PERLES) 100 MG capsule, Take 1 capsule (100 mg total) by mouth 3 (three) times daily as needed., Disp: 20 capsule, Rfl: 0 .  cetirizine (ZYRTEC) 10 MG tablet, Take 10 mg by mouth daily as needed for allergies. Seasonally, Disp: , Rfl:  .  citalopram (CELEXA) 20 MG tablet, Take 1 tablet (20 mg total) by mouth daily. For anxiety., Disp: 30 tablet, Rfl: 1 .  traZODone (DESYREL) 100 MG tablet, Take 1 tablet (100 mg total) by mouth at bedtime. For sleep., Disp: 90 tablet, Rfl: 0  EXAM:  VITALS per patient if  applicable:  GENERAL: alert, oriented, appears well and in no acute distress  HEENT: atraumatic, conjunttiva clear, no obvious abnormalities on inspection of external nose and ears  NECK: normal movements of the head and neck  LUNGS: on inspection no signs of respiratory distress, breathing rate appears normal, no obvious gross SOB, gasping or wheezing  CV: no obvious cyanosis  MS: moves all visible extremities without noticeable abnormality  PSYCH/NEURO: pleasant and cooperative, no obvious depression or anxiety, speech and thought processing grossly intact  ASSESSMENT AND PLAN:  Discussed the following assessment and plan:  Sore throat  Cough  Nasal congestion  -we discussed possible serious and likely etiologies, options for evaluation and workup, limitations of telemedicine visit vs in person visit, treatment, treatment risks and precautions. Pt prefers to treat via telemedicine empirically rather than in person at this moment.  Suspect viral illness, possible influenza given recent exposure, possible Covid versus other.  Opted for Tessalon Rx for cough, Covid testing (he plans to do this at home with home test), and other symptomatic care measures per patient instructions.  Discussed potential complications, precautions and isolation.  Advised to schedule prompt follow-up video visit through his primary care office or [facility] if he has a positive Covid test and would like to pursue treatment options. Work/School slipped offered: provided in patient instructions  Scheduled follow up with PCP offered: He agrees to schedule follow-up through his primary care office if needed. Advised to seek prompt in person care if worsening, new symptoms arise, or if is  not improving with treatment. Discussed options for inperson care if PCP office not available. Did let this patient know that I only do telemedicine on Tuesdays and Thursdays for [facility]. Advised to schedule follow up visit with  PCP or [facility] if any further questions or concerns to avoid delays in care.   I discussed the assessment and treatment plan with the patient. The patient was provided an opportunity to ask questions and all were answered. The patient agreed with the plan and demonstrated an understanding of the instructions.     Lucretia Kern, DO

## 2020-10-12 NOTE — Patient Instructions (Addendum)
   ---------------------------------------------------------------------------------------------------------------------------      WORK SLIP:  Patient Carl Moore,  12-24-1980, was seen for a medical visit today, 10/12/20 . Please excuse from work for illness. We advise 10 days minimum from the onset of symptoms (3/) PLUS 1 day of no fever and improved symptoms. Will defer to employer for a sooner return to work if symptoms have resolved and COVID test is negative OR it is greater than 5 days since the positive test symptoms have resolved and the patient can wear a high-quality, tight fitting mask such as N95 or KN95 at all times for an additional 5 days. Would also suggest COVID19 antigen testing is negative prior to return.  Sincerely: E-signature: Dr. Colin Benton, DO [place] Ph: [phone]   ------------------------------------------------------------------------------------------------------------------------------  HOME CARE TIPS:  -[facility] testing information: https://[email or web address]/ OR [phone] Most pharmacies also offer testing and home test kits. IF you have a positive test and wish to discuss treatment, please schedule a video visit through your primary care office or through [facility]. Treatment must be given in the first 5-7 days of illness.   -I sent the medication(s) we discussed to your pharmacy: Meds ordered this encounter  Medications  . benzonatate (TESSALON PERLES) 100 MG capsule    Sig: Take 1 capsule (100 mg total) by mouth 3 (three) times daily as needed.    Dispense:  20 capsule    Refill:  0     -can use tylenol or aleve if needed for fevers, aches and pains per instructions  -can use nasal saline a few times per day if you have nasal congestion; sometimes  a short course of Afrin nasal spray for 3 days can help with symptoms as well  -gargling salt water can help with  the sore throat -stay hydrated, drink plenty of fluids and eat small healthy meals - avoid dairy  -can take 1000 IU (10mcg) Vit D3 and 100-500 mg of Vit C daily per instructions  -If the Covid test is positive, check out the CDC website for more information on home care, transmission and treatment for COVID19  -follow up with your doctor in 2-3 days unless improving and feeling better  -stay home while sick, except to seek medical care, and if you have COVID19 ideally it would be best to stay home for a full 10 days since the onset of symptoms PLUS one day of no fever and feeling better.  It was nice to meet you today, and I really hope you are feeling better soon. I help [facility] out with telemedicine visits on Tuesdays and Thursdays and am available for visits on those days. If you have any concerns or questions following this visit please schedule a follow up visit with your Primary Care doctor or seek care at a local urgent care clinic to avoid delays in care.    Seek in person care or schedule a follow up video visit promptly if your symptoms worsen, new concerns arise or you are not improving with treatment. Call 911 and/or seek emergency care if your symptoms are severe or life threatening.

## 2020-10-16 ENCOUNTER — Telehealth (INDEPENDENT_AMBULATORY_CARE_PROVIDER_SITE_OTHER): Payer: 59 | Admitting: Internal Medicine

## 2020-10-16 ENCOUNTER — Other Ambulatory Visit: Payer: Self-pay | Admitting: Primary Care

## 2020-10-16 ENCOUNTER — Other Ambulatory Visit (INDEPENDENT_AMBULATORY_CARE_PROVIDER_SITE_OTHER): Payer: 59

## 2020-10-16 ENCOUNTER — Telehealth: Payer: Self-pay | Admitting: Primary Care

## 2020-10-16 ENCOUNTER — Encounter: Payer: Self-pay | Admitting: Internal Medicine

## 2020-10-16 ENCOUNTER — Other Ambulatory Visit: Payer: Self-pay

## 2020-10-16 DIAGNOSIS — R197 Diarrhea, unspecified: Secondary | ICD-10-CM | POA: Diagnosis not present

## 2020-10-16 DIAGNOSIS — J029 Acute pharyngitis, unspecified: Secondary | ICD-10-CM

## 2020-10-16 LAB — POC INFLUENZA A&B (BINAX/QUICKVUE)
Influenza A, POC: NEGATIVE
Influenza B, POC: NEGATIVE

## 2020-10-16 LAB — POCT RAPID STREP A (OFFICE): Rapid Strep A Screen: NEGATIVE

## 2020-10-16 NOTE — Progress Notes (Signed)
Virtual Visit via Video Note  I connected with Carl Moore on 10/16/20 at  2:00 PM EDT by a video enabled telemedicine application and verified that I am speaking with the correct person using two identifiers.  Location: Patient: Home Provider: Office  Person's participating in this video call: Webb Silversmith, NP-C and Sandy Salaam.   I discussed the limitations of evaluation and management by telemedicine and the availability of in person appointments. The patient expressed understanding and agreed to proceed.  History of Present Illness:   Pt reports sore throat and diarrhea. He reports the sore throat started 1 week ago. He is not having painful swallowing but reports it is uncomfortable. He denies post nasal drip. He reports the diarrhea started 4 days ago. He has had 4 loose stools today. He denies headache, runny nose, nasal congestion, ear pain, cough or SOB. He denies nausea, vomiting, abdominal pain or blood in his stool. He has had a fever but denies chills or body aches. He saw Dr. Maudie Mercury for the same 3/17. She advised him to take a home Covid test which was negative x 2. She prescribed Tessalon Perles  And Ibuprofen which he has been taking with minimal relief of symptoms . He has had sick contacts with someone who had a GI virus. He has not had exposure to Covid that he is aware of. He has had his covid vaccines.  Past Medical History:  Diagnosis Date  . Chicken pox   . Diverticulosis   . Fatty liver   . History of kidney stones     Current Outpatient Medications  Medication Sig Dispense Refill  . benzonatate (TESSALON PERLES) 100 MG capsule Take 1 capsule (100 mg total) by mouth 3 (three) times daily as needed. 20 capsule 0  . cetirizine (ZYRTEC) 10 MG tablet Take 10 mg by mouth daily as needed for allergies. Seasonally    . citalopram (CELEXA) 20 MG tablet Take 1 tablet (20 mg total) by mouth daily. For anxiety. 30 tablet 1  . traZODone (DESYREL) 100 MG tablet Take 1  tablet (100 mg total) by mouth at bedtime. For sleep. 90 tablet 0   No current facility-administered medications for this visit.    No Known Allergies  Family History  Problem Relation Age of Onset  . Hypertension Mother   . Stroke Father        Deceased  . Hypertension Father     Social History   Socioeconomic History  . Marital status: Married    Spouse name: Not on file  . Number of children: Not on file  . Years of education: Not on file  . Highest education level: Not on file  Occupational History  . Not on file  Tobacco Use  . Smoking status: Never Smoker  . Smokeless tobacco: Never Used  Vaping Use  . Vaping Use: Never used  Substance and Sexual Activity  . Alcohol use: No    Alcohol/week: 0.0 standard drinks  . Drug use: No  . Sexual activity: Yes  Other Topics Concern  . 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   No children.   Enjoys playing golf.   Social Determinants of Health   Financial Resource Strain: Not on file  Food Insecurity: Not on file  Transportation Needs: Not on file  Physical Activity: Not on file  Stress: Not on file  Social Connections: Not on file  Intimate Partner Violence: Not  on file     Constitutional: Denies fever, malaise, fatigue, headache or abrupt weight changes.  HEENT: Pt reports sore throat. Denies eye pain, eye redness, ear pain, ringing in the ears, wax buildup, runny nose, nasal congestion, bloody nose. Respiratory: Denies difficulty breathing, shortness of breath, cough or sputum production.   Cardiovascular: Denies chest pain, chest tightness, palpitations or swelling in the hands or feet.  Gastrointestinal: Pt reports diarrhea. Denies abdominal pain, bloating, constipation, or blood in the stool.   No other specific complaints in a complete review of systems (except as listed in HPI above).     Observations/Objective:  Wt Readings from Last 3 Encounters:   09/13/20 171 lb (77.6 kg)  06/14/20 161 lb (73 kg)  06/01/20 165 lb (74.8 kg)    General: In NAD. HEENT:  Nose: no congestion noted; Throat/Mouth: Slight hoarseness  noted.  Pulmonary/Chest: Normal effort. Neurological: Alert and oriented.   BMET    Component Value Date/Time   NA 137 03/06/2020 1432   K 4.0 03/06/2020 1432   CL 101 03/06/2020 1432   CO2 28 03/06/2020 1432   GLUCOSE 104 (H) 03/06/2020 1432   BUN 14 03/06/2020 1432   CREATININE 1.09 03/06/2020 1432   CALCIUM 8.7 (L) 03/06/2020 1432   GFRNONAA >60 03/06/2020 1432   GFRAA >60 03/06/2020 1432    Lipid Panel     Component Value Date/Time   CHOL 197 11/04/2019 0942   TRIG 231.0 (H) 11/04/2019 0942   HDL 44.60 11/04/2019 0942   CHOLHDL 4 11/04/2019 0942   VLDL 46.2 (H) 11/04/2019 0942   LDLCALC 124 (H) 03/16/2015 0830    CBC    Component Value Date/Time   WBC 8.5 03/06/2020 1432   RBC 5.96 (H) 03/06/2020 1432   HGB 17.6 (H) 03/06/2020 1432   HCT 53.7 (H) 03/06/2020 1432   PLT 332 03/06/2020 1432   MCV 90.1 03/06/2020 1432   MCH 29.5 03/06/2020 1432   MCHC 32.8 03/06/2020 1432   RDW 12.1 03/06/2020 1432   LYMPHSABS 1.3 01/24/2020 1416   MONOABS 0.5 01/24/2020 1416   EOSABS 0.2 01/24/2020 1416   BASOSABS 0.0 01/24/2020 1416    Hgb A1C Lab Results  Component Value Date   HGBA1C 5.2 03/16/2015       Assessment and Plan:  Sore Throat, Diarrhea:  Will have him come for [facility] strep, flu and covid testing Encouraged rest and fluids Can take Zyrtec 10 mg daily and Ibuprofen 400 mg every 8 hours as needed for pain and inflammation Salt water gargles may be helpful Avoid antidiarrheals at this time Consume a clear liquid/bland diet until diarrhea resolves  Will follow up after labs, return precautions discussed  Follow Up Instructions:    I discussed the assessment and treatment plan with the patient. The patient was provided an opportunity to ask questions and all were answered. The  patient agreed with the plan and demonstrated an understanding of the instructions.   The patient was advised to call back or seek an in-person evaluation if the symptoms worsen or if the condition fails to improve as anticipated.   Webb Silversmith, NP

## 2020-10-16 NOTE — Patient Instructions (Signed)

## 2020-10-17 LAB — SARS-COV-2, NAA 2 DAY TAT

## 2020-10-17 LAB — NOVEL CORONAVIRUS, NAA: SARS-CoV-2, NAA: NOT DETECTED

## 2020-10-24 DIAGNOSIS — F411 Generalized anxiety disorder: Secondary | ICD-10-CM

## 2020-10-25 MED ORDER — TRAZODONE HCL 100 MG PO TABS: 200.0000 mg | ORAL_TABLET | Freq: Every day | ORAL | 0 refills | Status: DC

## 2020-11-03 ENCOUNTER — Telehealth: Payer: 59 | Admitting: Primary Care

## 2020-11-08 ENCOUNTER — Telehealth (INDEPENDENT_AMBULATORY_CARE_PROVIDER_SITE_OTHER): Payer: 59 | Admitting: Primary Care

## 2020-11-08 ENCOUNTER — Other Ambulatory Visit: Payer: Self-pay

## 2020-11-08 ENCOUNTER — Encounter: Payer: Self-pay | Admitting: Primary Care

## 2020-11-08 VITALS — Ht 66.0 in | Wt 175.0 lb

## 2020-11-08 DIAGNOSIS — F411 Generalized anxiety disorder: Secondary | ICD-10-CM

## 2020-11-08 DIAGNOSIS — J302 Other seasonal allergic rhinitis: Secondary | ICD-10-CM

## 2020-11-08 DIAGNOSIS — G4700 Insomnia, unspecified: Secondary | ICD-10-CM | POA: Diagnosis not present

## 2020-11-08 MED ORDER — CITALOPRAM HYDROBROMIDE 40 MG PO TABS: 40.0000 mg | ORAL_TABLET | Freq: Every day | ORAL | 3 refills | Status: DC

## 2020-11-08 MED ORDER — TRAZODONE HCL 100 MG PO TABS: 100.0000 mg | ORAL_TABLET | Freq: Every day | ORAL | 1 refills | Status: DC

## 2020-11-08 MED ORDER — FLUTICASONE PROPIONATE 50 MCG/ACT NA SUSP: 1.0000 | Freq: Two times a day (BID) | NASAL | 0 refills | Status: DC

## 2020-11-08 NOTE — Progress Notes (Signed)
Patient ID: Carl Moore, male    DOB: 1980/08/31, 40 y.o.   MRN: [id number]  Virtual visit completed through [place], a video enabled telemedicine application. Due to national recommendations of social distancing due to COVID-19, a virtual visit is felt to be most appropriate for this patient at this time. Reviewed limitations, risks, security and privacy concerns of performing a virtual visit and the availability of in person appointments. I also reviewed that there may be a patient responsible charge related to this service. The patient agreed to proceed.   Unfortunately he was unable to connect via video so we conducted our visit via phone which lasted 6 min and 29 sec.  Patient location: home Provider location: [place] at [facility], office Persons participating in this virtual visit: patient, provider   If any vitals were documented, they were collected by patient at home unless specified below.    Ht 5\' 6"  (1.676 m)   Wt 175 lb (79.4 kg)   BMI 28.25 kg/m    CC: Follow up for insomnia and anxiety Subjective:   HPI: Carl Moore is a 40 y.o. male presenting on 11/08/2020 for Anxiety (Some improvement with symptoms ) and Nasal Congestion (Productive with green drainage. Has been on xyzal but has not been helping much. )    Mr. Ruffolo is a 40 year old male with a history of insomnia, anxiety who presents today for follow up of anxiety/insomnia and a chief complaint of nasal congestion.  He was last evaluated by myself in mid February 2022 for sleep disturbance despite moderate dose of Trazodone at 150 mg. During that visit we decided to initiate antianxiety treatment with citalopram 20 mg as this was suspected to be contributing to anxiety. He sent a my chart message a few weeks ago endorsing that he increased his Trazodone to 200 mg on his own and was requesting a refill. He did not have my permission to do so.   Since his last visit he believes that his anxiety  is better on citalopram 20 mg. Positive effects include feeling less anxious, is less irritable, feels more focused. His wife has really noticed a difference. He does continue to feel some lack of motivation. He did take 40 mg of citalopram for one day and noticed more improvement, would like to try a dose increase. He has reduced back down to Trazodone 100 mg. Sleeping overall has improved.   Evaluated twice virtually in March 2022 for sore throat, treated with conservative measures. All testing negative. He is mostly experiencing post nasal drip, rhinorrhea and has been taking Xyzal without improvement. No fevers.       Relevant past medical, surgical, family and social history reviewed and updated as indicated. Interim medical history since our last visit reviewed. Allergies and medications reviewed and updated. Outpatient Medications Prior to Visit  Medication Sig Dispense Refill  . Levocetirizine Dihydrochloride (XYZAL PO) Take by mouth.    . citalopram (CELEXA) 20 MG tablet Take 1 tablet (20 mg total) by mouth daily. For anxiety. 30 tablet 1  . traZODone (DESYREL) 100 MG tablet Take 2 tablets (200 mg total) by mouth at bedtime. For sleep. 60 tablet 0  . benzonatate (TESSALON PERLES) 100 MG capsule Take 1 capsule (100 mg total) by mouth 3 (three) times daily as needed. 20 capsule 0  . cetirizine (ZYRTEC) 10 MG tablet Take 10 mg by mouth daily as needed for allergies. Seasonally     No facility-administered medications prior to  visit.     Per HPI unless specifically indicated in ROS section below Review of Systems  Constitutional: Negative for fever.  HENT: Positive for postnasal drip, rhinorrhea and sneezing.   Allergic/Immunologic: Positive for environmental allergies.  Psychiatric/Behavioral: Positive for sleep disturbance. The patient is nervous/anxious.        See HPI   Objective:  Ht 5\' 6"  (1.676 m)   Wt 175 lb (79.4 kg)   BMI 28.25 kg/m   Wt Readings from Last 3 Encounters:   11/08/20 175 lb (79.4 kg)  09/13/20 171 lb (77.6 kg)  06/14/20 161 lb (73 kg)       Physical exam: Gen: alert, NAD, not ill appearing Pulm: speaks in complete sentences without increased work of breathing Psych: normal mood, normal thought content      Results for orders placed or performed in visit on 10/16/20  Novel Coronavirus, NAA ([facility])   Specimen: Nasopharyngeal(NP) swabs in vial transport medium   Nasopharynge  Is this  Result Value Ref Range   SARS-CoV-2, NAA Not Detected Not Detected  SARS-COV-2, NAA 2 DAY TAT   Nasopharynge  Is this  Result Value Ref Range   SARS-CoV-2, NAA 2 DAY TAT Performed   POC Influenza A&B(BINAX/QUICKVUE)  Result Value Ref Range   Influenza A, POC Negative Negative   Influenza B, POC Negative Negative  Rapid Strep A  Result Value Ref Range   Rapid Strep A Screen Negative Negative   Assessment & Plan:   Problem List Items Addressed This Visit      Other   Seasonal allergies - Primary    Active symptoms now. Stop Xyzal, start Zyrtec and Flonase.       Relevant Medications   fluticasone (FLONASE) 50 MCG/ACT nasal spray   Insomnia    Improving with addition of citalopram, will increase dose to 40 mg. Discussed to continue Trazodone 100 mg with goal of potentially discontinuing one day, he agrees.  He will update.       GAD (generalized anxiety disorder)    Improving on citalopram 20 mg, not quite at goal. Increase citalopram to 40 mg daily. Continue Trazodone at 100 mg HS.   He will update.       Relevant Medications   citalopram (CELEXA) 40 MG tablet   traZODone (DESYREL) 100 MG tablet       Meds ordered this encounter  Medications  . citalopram (CELEXA) 40 MG tablet    Sig: Take 1 tablet (40 mg total) by mouth daily. For anxiety.    Dispense:  90 tablet    Refill:  3    Order Specific Question:   Supervising Provider    Answer:   BEDSOLE, AMY E [[id number]]  . traZODone (DESYREL) 100 MG tablet    Sig: Take 1 tablet  (100 mg total) by mouth at bedtime. For sleep.    Dispense:  90 tablet    Refill:  1    Order Specific Question:   Supervising Provider    Answer:   BEDSOLE, AMY E [[id number]]  . fluticasone (FLONASE) 50 MCG/ACT nasal spray    Sig: Place 1 spray into both nostrils 2 (two) times daily.    Dispense:  16 g    Refill:  0    Order Specific Question:   Supervising Provider    Answer:   BEDSOLE, AMY E [[id number]]   No orders of the defined types were placed in this encounter.   I discussed the assessment and treatment plan  with the patient. The patient was provided an opportunity to ask questions and all were answered. The patient agreed with the plan and demonstrated an understanding of the instructions. The patient was advised to call back or seek an in-person evaluation if the symptoms worsen or if the condition fails to improve as anticipated.  Follow up plan:  We increased your dose of citalopram to 40 mg. Take 1 tablet once daily.  Continue Trazodone at 100 mg at bedtime for sleep.   Please update me if anything changes, nice to see you! [name], NP-C   No follow-ups on file.  Pleas [name], NP

## 2020-11-08 NOTE — Patient Instructions (Signed)
We increased your dose of citalopram to 40 mg. Take 1 tablet once daily.  Continue Trazodone at 100 mg at bedtime for sleep.   Please update me if anything changes, nice to see you! [name], NP-C

## 2020-11-08 NOTE — Assessment & Plan Note (Signed)
Improving on citalopram 20 mg, not quite at goal. Increase citalopram to 40 mg daily. Continue Trazodone at 100 mg HS.   He will update.

## 2020-11-08 NOTE — Assessment & Plan Note (Signed)
Active symptoms now. Stop Xyzal, start Zyrtec and Flonase.

## 2020-11-08 NOTE — Assessment & Plan Note (Signed)
Improving with addition of citalopram, will increase dose to 40 mg. Discussed to continue Trazodone 100 mg with goal of potentially discontinuing one day, he agrees.  He will update.

## 2020-11-09 NOTE — Telephone Encounter (Signed)
error 

## 2020-11-16 DIAGNOSIS — F411 Generalized anxiety disorder: Secondary | ICD-10-CM

## 2020-11-17 MED ORDER — ESCITALOPRAM OXALATE 20 MG PO TABS
20.0000 mg | ORAL_TABLET | Freq: Every day | ORAL | 0 refills | Status: DC
Start: 2020-11-17 — End: 2020-11-23

## 2020-11-23 DIAGNOSIS — F411 Generalized anxiety disorder: Secondary | ICD-10-CM

## 2020-11-23 DIAGNOSIS — R6882 Decreased libido: Secondary | ICD-10-CM

## 2020-11-23 MED ORDER — BUPROPION HCL ER (SR) 100 MG PO TB12: 100.0000 mg | ORAL_TABLET | Freq: Every day | ORAL | 3 refills | Status: DC

## 2020-11-27 ENCOUNTER — Other Ambulatory Visit: Payer: Self-pay | Admitting: Primary Care

## 2020-11-27 DIAGNOSIS — F411 Generalized anxiety disorder: Secondary | ICD-10-CM

## 2020-12-05 ENCOUNTER — Telehealth (INDEPENDENT_AMBULATORY_CARE_PROVIDER_SITE_OTHER): Payer: 59 | Admitting: Family Medicine

## 2020-12-05 DIAGNOSIS — R197 Diarrhea, unspecified: Secondary | ICD-10-CM

## 2020-12-05 NOTE — Progress Notes (Signed)
Virtual Visit via Video Note  I connected with Carl Moore  on 12/05/20 at 12:40 PM EDT by a video enabled telemedicine application and verified that I am speaking with the correct person using two identifiers.  Location patient: home, [place] Location provider:work or home office Persons participating in the virtual visit: patient, provider  I discussed the limitations of evaluation and management by telemedicine and the availability of in person appointments. The patient expressed understanding and agreed to proceed.   HPI:  Acute telemedicine visit for Diarrhea: -Onset: 5-6 days -Symptoms include: a lot of diarrhea for several day, stomach upset for a few days -Denies: CP, SOB, vomiting, melena, abd pain, hematochezia, inability to eat/drink/get out bed -denies recent abx or travel -stomach bug has been going around at his daughter's school -has had 2 doses + booster of covid vaccine  ROS: See pertinent positives and negatives per HPI.  Past Medical History:  Diagnosis Date  . Chicken pox   . Diverticulosis   . Fatty liver   . History of kidney stones     Past Surgical History:  Procedure Laterality Date  . bone spur Left 02/2018   left big toe  . KNEE ARTHROSCOPY Left   . LIGAMENT REPAIR Left 2021   thumb (pinned)  . ULNAR NERVE REPAIR Left 1999     Current Outpatient Medications:  .  buPROPion (WELLBUTRIN SR) 100 MG 12 hr tablet, Take 1 tablet (100 mg total) by mouth daily., Disp: 90 tablet, Rfl: 3 .  citalopram (CELEXA) 40 MG tablet, Take 40 mg by mouth daily., Disp: , Rfl:  .  fluticasone (FLONASE) 50 MCG/ACT nasal spray, Place 1 spray into both nostrils 2 (two) times daily., Disp: 16 g, Rfl: 0 .  Levocetirizine Dihydrochloride (XYZAL PO), Take by mouth., Disp: , Rfl:  .  traZODone (DESYREL) 100 MG tablet, TAKE 2 TABLETS BY MOUTH AT BEDTIME FOR SLEEP, Disp: 60 tablet, Rfl: 0  EXAM:  VITALS per patient if applicable:  GENERAL: alert, oriented, appears well and in no  acute distress  HEENT: atraumatic, conjunttiva clear, no obvious abnormalities on inspection of external nose and ears  NECK: normal movements of the head and neck  LUNGS: on inspection no signs of respiratory distress, breathing rate appears normal, no obvious gross SOB, gasping or wheezing  CV: no obvious cyanosis  MS: moves all visible extremities without noticeable abnormality  PSYCH/NEURO: pleasant and cooperative, no obvious depression or anxiety, speech and thought processing grossly intact  ASSESSMENT AND PLAN:  Discussed the following assessment and plan:  Diarrhea, unspecified type  -we discussed possible serious and likely etiologies, options for evaluation and workup, limitations of telemedicine visit vs in person visit, treatment, treatment risks and precautions. Pt prefers to treat via telemedicine empirically rather than in person at this moment. Query viral gastroenteritis vs other. Opted since improving for imodium if needed, oral hydration, no dairy for a few days. Advised covid test given current surge in cases we are seeing in clinic.  Work/School slipped offered: provided in patient instructions  Scheduled follow up with PCP offered:follow up as needed.  Advised to seek prompt in person care if worsening, new symptoms arise, or if is not improving with treatment. Discussed options for inperson care if PCP office not available.   I discussed the assessment and treatment plan with the patient. The patient was provided an opportunity to ask questions and all were answered. The patient agreed with the plan and demonstrated an understanding of the instructions.  Lucretia Kern, DO

## 2020-12-05 NOTE — Patient Instructions (Addendum)
   ---------------------------------------------------------------------------------------------------------------------------      WORK SLIP:  Patient Carl Moore,  1980-08-25, was seen for a medical visit today, 12/05/20 . Please excuse from work for illness. If covid test is positive advise 10 days minimum from the onset of symptoms (11/30/20) PLUS 1 day of no fever and improved symptoms. Will defer to employer for a sooner return to work if covid testing is negative and symptoms have resolved for 24 hours.   Sincerely: E-signature: Dr. Colin Benton, DO [place] Ph: [phone]   ------------------------------------------------------------------------------------------------------------------------------  Drink plenty of fluids.  Can try imodium if any further diarrhea.  I am glad you are feeling better!  Seek in person care promptly if your symptoms worsen, new concerns arise or you are not improving with treatment.  It was nice to meet you today. I help [place] out with telemedicine visits on Tuesdays and Thursdays and am available for visits on those days. If you have any concerns or questions following this visit please schedule a follow up visit with your Primary Care doctor or seek care at a local urgent care clinic to avoid delays in care.

## 2020-12-12 ENCOUNTER — Other Ambulatory Visit: Payer: Self-pay | Admitting: Primary Care

## 2020-12-12 DIAGNOSIS — F411 Generalized anxiety disorder: Secondary | ICD-10-CM

## 2020-12-23 ENCOUNTER — Other Ambulatory Visit: Payer: Self-pay | Admitting: Primary Care

## 2020-12-23 ENCOUNTER — Other Ambulatory Visit: Payer: Self-pay | Admitting: Family Medicine

## 2020-12-23 DIAGNOSIS — F411 Generalized anxiety disorder: Secondary | ICD-10-CM

## 2021-01-22 ENCOUNTER — Other Ambulatory Visit: Payer: Self-pay | Admitting: Primary Care

## 2021-01-22 DIAGNOSIS — F411 Generalized anxiety disorder: Secondary | ICD-10-CM

## 2021-02-21 ENCOUNTER — Other Ambulatory Visit: Payer: Self-pay | Admitting: Primary Care

## 2021-02-21 DIAGNOSIS — F411 Generalized anxiety disorder: Secondary | ICD-10-CM

## 2021-02-22 NOTE — Telephone Encounter (Signed)
Please call patient and find out if he's taking:  1.Trazodone for sleep. If so, then how many tablets at night. 2. Citalopram 40 mg for anxiety 3. Bupropion 100 mg to counteract side effects from citalopram causing decreased erection/libido   Received refill request, just need to know what he's actually taking.

## 2021-02-26 NOTE — Telephone Encounter (Signed)
Left message to return call to our office.  

## 2021-02-28 NOTE — Telephone Encounter (Signed)
Returning phone call from Carl Moore  

## 2021-02-28 NOTE — Telephone Encounter (Signed)
Do you want me to call and get set up for follow up

## 2021-02-28 NOTE — Telephone Encounter (Signed)
Yes, he definitely needs to be seen for follow up. Please schedule.

## 2021-03-02 NOTE — Telephone Encounter (Signed)
  Encourage patient to contact the pharmacy for refills or they can request refills through [place]:  Please schedule appointment if longer than 1 year  NEXT APPOINTMENT DATE:  MEDICATION:traZODone (DESYREL) 100 MG table  Is the patient out of medication?   [place], [place] - [phone]  Let patient know to contact pharmacy at the end of the day to make sure medication is ready.  Please notify patient to allow 48-72 hours to process  CLINICAL FILLS OUT ALL BELOW:   LAST REFILL:  QTY:  REFILL DATE:    OTHER COMMENTS:    Okay for refill?  Please advise

## 2021-03-02 NOTE — Telephone Encounter (Signed)
Please call patient for follow-up

## 2021-03-05 ENCOUNTER — Other Ambulatory Visit: Payer: Self-pay | Admitting: Primary Care

## 2021-03-05 DIAGNOSIS — F411 Generalized anxiety disorder: Secondary | ICD-10-CM

## 2021-03-16 ENCOUNTER — Telehealth (INDEPENDENT_AMBULATORY_CARE_PROVIDER_SITE_OTHER): Payer: Commercial Managed Care - PPO | Admitting: Nurse Practitioner

## 2021-03-16 ENCOUNTER — Encounter: Payer: Self-pay | Admitting: Nurse Practitioner

## 2021-03-16 VITALS — Temp 100.1°F | Wt 175.0 lb

## 2021-03-16 DIAGNOSIS — U071 COVID-19: Secondary | ICD-10-CM

## 2021-03-16 HISTORY — DX: COVID-19: U07.1

## 2021-03-16 MED ORDER — MOLNUPIRAVIR EUA 200MG CAPSULE: 4.0000 | ORAL_CAPSULE | Freq: Two times a day (BID) | ORAL | 0 refills | Status: AC

## 2021-03-16 NOTE — Progress Notes (Signed)
Patient ID: MAK SNEERINGER, male    DOB: 09-03-1980, 40 y.o.   MRN: [id number]  Virtual visit completed through [place], a video enabled telemedicine application. Due to national recommendations of social distancing due to COVID-19, a virtual visit is felt to be most appropriate for this patient at this time. Reviewed limitations, risks, security and privacy concerns of performing a virtual visit and the availability of in person appointments. I also reviewed that there may be a patient responsible charge related to this service. The patient agreed to proceed.   Patient location: home Provider location: [place] at [facility], office Persons participating in this virtual visit: patient, provider   If any vitals were documented, they were collected by patient at home unless specified below.    Temp 100.1 F (37.8 C) Comment: per patient  Wt 175 lb (79.4 kg)   BMI 28.25 kg/m    CC: Covid 19 infection  Subjective:   HPI: Carl Moore is a 40 y.o. male presenting on 03/16/2021 for covid 19 infection. States his daughter was ill, then his spouse got it, then he got it. He has been using OTC tylenol for the fever and body aches with some relief. He is staying hydrated per his report.   Relevant past medical, surgical, family and social history reviewed and updated as indicated. Interim medical history since our last visit reviewed. Allergies and medications reviewed and updated. Outpatient Medications Prior to Visit  Medication Sig Dispense Refill   buPROPion (WELLBUTRIN SR) 100 MG 12 hr tablet Take 1 tablet (100 mg total) by mouth daily. 90 tablet 3   citalopram (CELEXA) 40 MG tablet Take 40 mg by mouth daily.     traZODone (DESYREL) 100 MG tablet TAKE 2 TABLETS BY MOUTH AT BEDTIME FOR SLEEP 60 tablet 0   fluticasone (FLONASE) 50 MCG/ACT nasal spray Place 1 spray into both nostrils 2 (two) times daily. (Patient not taking: Reported on 03/16/2021) 16 g 0   Levocetirizine  Dihydrochloride (XYZAL PO) Take by mouth. (Patient not taking: Reported on 03/16/2021)     No facility-administered medications prior to visit.     Per HPI unless specifically indicated in ROS section below Review of Systems  Constitutional:  Positive for fatigue and fever.  HENT:  Positive for sore throat. Negative for ear discharge, ear pain and voice change.   Respiratory:  Positive for cough. Negative for shortness of breath.   Cardiovascular:  Positive for chest pain.  Gastrointestinal:  Negative for abdominal pain, diarrhea, nausea and vomiting.  Musculoskeletal:  Positive for myalgias.  Neurological:  Positive for headaches.  Objective:  Temp 100.1 F (37.8 C) Comment: per patient  Wt 175 lb (79.4 kg)   BMI 28.25 kg/m   Wt Readings from Last 3 Encounters:  03/16/21 175 lb (79.4 kg)  11/08/20 175 lb (79.4 kg)  09/13/20 171 lb (77.6 kg)       Physical exam: Gen: alert, NAD, not ill appearing Pulm: speaks in complete sentences without increased work of breathing Psych: normal mood, normal thought content      Results for orders placed or performed in visit on 10/16/20  Novel Coronavirus, NAA ([facility])   Specimen: Nasopharyngeal(NP) swabs in vial transport medium   Nasopharynge  Is this  Result Value Ref Range   SARS-CoV-2, NAA Not Detected Not Detected  SARS-COV-2, NAA 2 DAY TAT   Nasopharynge  Is this  Result Value Ref Range   SARS-CoV-2, NAA 2 DAY TAT Performed  POC Influenza A&B(BINAX/QUICKVUE)  Result Value Ref Range   Influenza A, POC Negative Negative   Influenza B, POC Negative Negative  Rapid Strep A  Result Value Ref Range   Rapid Strep A Screen Negative Negative   Assessment & Plan:   Problem List Items Addressed This Visit       Other   COVID-19 virus infection - Primary    Patient family members tested positive for COVID he subsequently tested positive for COVID.  He was tested through his employer.  Did discuss treatment options with patient  him being low risk and vaccinated.  Given symptomology has not had any improvement since inception we did discuss going ahead and treating to prevent severe disease.  Patient acknowledged understand that we are using emergency use authorization medication.  Start molnupiravir.      Relevant Medications   molnupiravir EUA 200 mg CAPS     No orders of the defined types were placed in this encounter.  No orders of the defined types were placed in this encounter.   I discussed the assessment and treatment plan with the patient. The patient was provided an opportunity to ask questions and all were answered. The patient agreed with the plan and demonstrated an understanding of the instructions. The patient was advised to call back or seek an in-person evaluation if the symptoms worsen or if the condition fails to improve as anticipated.  Follow up plan: No follow-ups on file.  Romilda Garret, NP

## 2021-03-16 NOTE — Assessment & Plan Note (Signed)
Patient family members tested positive for COVID he subsequently tested positive for COVID.  He was tested through his employer.  Did discuss treatment options with patient him being low risk and vaccinated.  Given symptomology has not had any improvement since inception we did discuss going ahead and treating to prevent severe disease.  Patient acknowledged understand that we are using emergency use authorization medication.  Start molnupiravir.

## 2021-03-20 ENCOUNTER — Telehealth: Payer: Self-pay

## 2021-03-20 ENCOUNTER — Telehealth: Payer: Commercial Managed Care - PPO | Admitting: Primary Care

## 2021-03-20 NOTE — Telephone Encounter (Signed)
LMTCB to reschedule missed appt

## 2021-03-20 NOTE — Telephone Encounter (Signed)
Was not able to contact patient for virtual visit. Please call to reschedule with Anda Kraft.

## 2021-04-03 ENCOUNTER — Ambulatory Visit (INDEPENDENT_AMBULATORY_CARE_PROVIDER_SITE_OTHER): Payer: Commercial Managed Care - PPO | Admitting: Primary Care

## 2021-04-03 ENCOUNTER — Other Ambulatory Visit: Payer: Self-pay

## 2021-04-03 VITALS — BP 132/80 | HR 82 | Temp 99.1°F | Ht 66.0 in | Wt 167.0 lb

## 2021-04-03 DIAGNOSIS — F411 Generalized anxiety disorder: Secondary | ICD-10-CM

## 2021-04-03 DIAGNOSIS — G4700 Insomnia, unspecified: Secondary | ICD-10-CM | POA: Diagnosis not present

## 2021-04-03 LAB — COMPREHENSIVE METABOLIC PANEL
ALT: 27 U/L (ref 0–53)
AST: 17 U/L (ref 0–37)
Albumin: 3.7 g/dL (ref 3.5–5.2)
Alkaline Phosphatase: 63 U/L (ref 39–117)
BUN: 8 mg/dL (ref 6–23)
CO2: 28 mEq/L (ref 19–32)
Calcium: 9 mg/dL (ref 8.4–10.5)
Chloride: 103 mEq/L (ref 96–112)
Creatinine, Ser: 1.19 mg/dL (ref 0.40–1.50)
GFR: 75.7 mL/min (ref >60.00)
Glucose, Bld: 96 mg/dL (ref 70–99)
Potassium: 4.5 mEq/L (ref 3.5–5.1)
Sodium: 138 mEq/L (ref 135–145)
Total Bilirubin: 0.5 mg/dL (ref 0.2–1.2)
Total Protein: 6.1 g/dL (ref 6.0–8.3)

## 2021-04-03 LAB — CBC
HCT: 48.8 % (ref 39.0–52.0)
Hemoglobin: 15.7 g/dL (ref 13.0–17.0)
MCHC: 32.2 g/dL (ref 30.0–36.0)
MCV: 88 fl (ref 78.0–100.0)
Platelets: 434 10*3/uL — ABNORMAL HIGH (ref 150.0–400.0)
RBC: 5.54 Mil/uL (ref 4.22–5.81)
RDW: 14.3 % (ref 11.5–15.5)
WBC: 7.8 10*3/uL (ref 4.0–10.5)

## 2021-04-03 LAB — TSH: TSH: 1.02 u[IU]/mL (ref 0.35–5.50)

## 2021-04-03 LAB — HEMOGLOBIN A1C: Hgb A1c MFr Bld: 5.3 % (ref 4.6–6.5)

## 2021-04-03 MED ORDER — MIRTAZAPINE 15 MG PO TABS: 15.0000 mg | ORAL_TABLET | Freq: Every day | ORAL | 0 refills | Status: DC

## 2021-04-03 MED ORDER — SERTRALINE HCL 50 MG PO TABS
50.0000 mg | ORAL_TABLET | Freq: Every day | ORAL | 0 refills | Status: DC
Start: 2021-04-03 — End: 2021-05-10

## 2021-04-03 NOTE — Assessment & Plan Note (Addendum)
Continued, Trazodone 200 mg is ineffective, also citalopram 40 mg not as effective for anxiety symptoms.  Checking labs to rule out metabolic cause.   Stop citalopram 40 mg. Start sertraline 50 mg.  Wean off Trazodone 200 mg, instructions provided. Start mirtazapine 7.5 mg x 1 week, then 15 mg thereafter.  Stop bupropion.   He will update via my chart.

## 2021-04-03 NOTE — Progress Notes (Signed)
Subjective:    Patient ID: Carl Moore, male    DOB: 07-06-1981, 40 y.o.   MRN: [id number]  HPI  Carl Moore is a very pleasant 40 y.o. male with a history of insomnia, GAD who presents today for follow up of anxiety and insomnia.   Currently prescribed citalopram 40 mg for GAD, bupropion SR 100 mg daily to counteract decreased libido side effects of citalopram, and trazodone 200 mg HS for insomnia.   Originally initiated on citalopram as symptoms of insomnia were suspected to be secondary to anxiety. Initially he responded well to citalopram in terms of anxiety and sleep, but citalopram caused decreased libido. He was prescribed bupropion SR 100 mg for side effects of decreased libido and today he mentions he has noticed improvement.   Today he also mentions that citalopram is ineffective as he's noticed an increase in anxiety symptoms. He's never felt that Trazodone was effective to help with sleep as he continuously has difficulty sleeping. Symptoms include irritable throughout the day, difficulty falling asleep (1 hour), wakes up twice nightly, will sometimes not fall back asleep, will toss and turn until his alarm clock goes off.   He is now working in Event organiser. He is lifting weights often, does take a pre work out drink and creatine. Denies other supplements, family history of anxiety.    Review of Systems  Respiratory:  Negative for shortness of breath.   Cardiovascular:  Negative for chest pain and palpitations.  Psychiatric/Behavioral:  Positive for sleep disturbance. The patient is nervous/anxious.         Past Medical History:  Diagnosis Date   Chicken pox    Diverticulosis    Fatty liver    History of kidney stones     Social History   Socioeconomic History   Marital status: Married    Spouse name: Not on file   Number of children: Not on file   Years of education: Not on file   Highest education level: Not on file  Occupational History   Not  on file  Tobacco Use   Smoking status: Never   Smokeless tobacco: Never  Vaping Use   Vaping Use: Never used  Substance and Sexual Activity   Alcohol use: No    Alcohol/week: 0.0 standard drinks   Drug use: No   Sexual activity: Yes  Other Topics Concern   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   No children.   Enjoys playing golf.   Social Determinants of Health   Financial Resource Strain: Not on file  Food Insecurity: Not on file  Transportation Needs: Not on file  Physical Activity: Not on file  Stress: Not on file  Social Connections: Not on file  Intimate Partner Violence: Not on file    Past Surgical History:  Procedure Laterality Date   bone spur Left 02/2018   left big toe   KNEE ARTHROSCOPY Left    LIGAMENT REPAIR Left 2021   thumb (pinned)   ULNAR NERVE REPAIR Left 1999    Family History  Problem Relation Age of Onset   Hypertension Mother    Stroke Father        Deceased   Hypertension Father     No Known Allergies  Current Outpatient Medications on File Prior to Visit  Medication Sig Dispense Refill   buPROPion (WELLBUTRIN SR) 100 MG 12 hr tablet Take 1 tablet (100 mg total)  by mouth daily. 90 tablet 3   citalopram (CELEXA) 40 MG tablet Take 40 mg by mouth daily.     fluticasone (FLONASE) 50 MCG/ACT nasal spray Place 1 spray into both nostrils 2 (two) times daily. (Patient taking differently: Place 1 spray into both nostrils as needed.) 16 g 0   Levocetirizine Dihydrochloride (XYZAL PO) Take 1 tablet by mouth as needed (seasonal allergies).     traZODone (DESYREL) 100 MG tablet TAKE 2 TABLETS BY MOUTH AT BEDTIME FOR SLEEP 60 tablet 0   No current facility-administered medications on file prior to visit.    BP 132/80   Pulse 82   Temp 99.1 F (37.3 C) (Temporal)   Ht '5\' 6"'$  (1.676 m)   Wt 167 lb (75.8 kg)   SpO2 97%   BMI 26.95 kg/m  Objective:   Physical Exam Cardiovascular:     Rate and  Rhythm: Normal rate and regular rhythm.  Pulmonary:     Effort: Pulmonary effort is normal.     Breath sounds: Normal breath sounds. No wheezing or rales.  Musculoskeletal:     Cervical back: Neck supple.  Skin:    General: Skin is warm and dry.  Neurological:     Mental Status: He is alert and oriented to person, place, and time.  Psychiatric:        Mood and Affect: Mood normal.     Comments: Appears restless today          Assessment & Plan:      This visit occurred during the SARS-CoV-2 public health emergency.  Safety protocols were in place, including screening questions prior to the visit, additional usage of staff PPE, and extensive cleaning of exam room while observing appropriate contact time as indicated for disinfecting solutions.

## 2021-04-03 NOTE — Patient Instructions (Addendum)
Stop citalopram 40 mg for anxiety.  Start sertraline (Zoloft) 50 mg daily for anxiety.   Reduce your Trazodone to 1 tablet nightly for one week, then take 1/2 tablet nightly for one week, then stop.  Start mirtazapine 15 mg for sleep. Start with 1/2 tablet nightly for sleep for one week, then increase to 1 full tablet thereafter.   Stop taking bupropion SR for now.   Please update me in a few weeks.  It was a pleasure to see you today!

## 2021-04-17 ENCOUNTER — Telehealth: Payer: Self-pay | Admitting: Primary Care

## 2021-04-17 NOTE — Telephone Encounter (Signed)
Spoke with patient. Took Remeron 2 times since getting RX, last time was last night. Rash is raised, red and itching. Took 2 Benadryls this morning and it did not help with symptoms but rash has not spread anymore since this morning. Rash is present on his chest, back, arms and buttocks area. No difficulty breathing, no swelling in his throat. Patient would like to know what to do

## 2021-04-17 NOTE — Telephone Encounter (Signed)
[place] called in wanted to speak with Anda Kraft

## 2021-04-17 NOTE — Telephone Encounter (Signed)
See other note

## 2021-04-17 NOTE — Telephone Encounter (Signed)
Pt called stating that he had a reaction with his medicatiomirtazapine (REMERON) 15 MG tablet, says he had a rash all over his body and would like information on what to do.

## 2021-04-18 DIAGNOSIS — L270 Generalized skin eruption due to drugs and medicaments taken internally: Secondary | ICD-10-CM

## 2021-04-18 DIAGNOSIS — F411 Generalized anxiety disorder: Secondary | ICD-10-CM

## 2021-04-18 NOTE — Addendum Note (Signed)
Addended by: Pleas [name] on: 04/18/2021 01:00 PM   Modules accepted: Orders

## 2021-04-18 NOTE — Telephone Encounter (Signed)
Called patient l/m to call office. Also sent my chart with response and asked him to reach back out to [place] with answer.

## 2021-04-18 NOTE — Telephone Encounter (Signed)
Pt called in stated he has medication concern and would like a call back #[phone]

## 2021-04-18 NOTE — Telephone Encounter (Signed)
Noted.  Please have him refrain from taking additional mirtazapine, will add to allergy list.  How is he doing on the Zoloft 50 mg so far?  Any difficulty switching from citalopram to Zoloft?  Did he wean off trazodone?

## 2021-04-18 NOTE — Telephone Encounter (Addendum)
Please see pt message from 04/18/21. Sending note back to Gentry Fitz NP and Joellen CMA. Will also send teams to [facility].

## 2021-04-22 ENCOUNTER — Other Ambulatory Visit: Payer: Self-pay | Admitting: Primary Care

## 2021-04-22 DIAGNOSIS — F411 Generalized anxiety disorder: Secondary | ICD-10-CM

## 2021-04-23 NOTE — Telephone Encounter (Signed)
Called patient Verified not taking any longer.

## 2021-04-23 NOTE — Telephone Encounter (Signed)
Called patient he has not taken Mirtazapine.  No issues with the change to Zoloft.  Has not been taking trazodone.

## 2021-04-24 ENCOUNTER — Other Ambulatory Visit: Payer: Self-pay

## 2021-04-24 ENCOUNTER — Telehealth (INDEPENDENT_AMBULATORY_CARE_PROVIDER_SITE_OTHER): Payer: Commercial Managed Care - PPO | Admitting: Nurse Practitioner

## 2021-04-24 VITALS — Temp 98.7°F | Wt 167.0 lb

## 2021-04-24 DIAGNOSIS — R197 Diarrhea, unspecified: Secondary | ICD-10-CM

## 2021-04-24 NOTE — Patient Instructions (Signed)
Nice to see you today As we talked about if you develop fever, chills, trouble swallowing, abdominal (belly) pain, blood or mucous in your stool let us know. Stay hydrated like we talked about keeping your urine a pale yellow or clear color.

## 2021-04-24 NOTE — Assessment & Plan Note (Signed)
Patient had acute onset diarrhea on 04/22/2021.  No preceding event he is aware of.  Unsure of sick contacts as he works with the public.  Did state his daughter had a stomach bug the week before.  States he is having 5-10 loose watery stools daily.  Still able to eat and drink per patient report also urinating 4-5 times a day.  Upon review does have a incidental finding of diverticulosis without diagnosis of diverticulitis.  Patient is not having any abdominal pain currently.  Did give him strict precautions verbally and on AVS for return to clinic evaluation.  Told him diarrhea will likely need to run its course did mention the possibility of using loperamide over-the-counter.  Continue to monitor

## 2021-04-24 NOTE — Progress Notes (Signed)
Patient ID: Carl Moore, male    DOB: 05-Jun-1981, 40 y.o.   MRN: [id number]  Virtual visit completed through [place], a video enabled telemedicine application. Due to national recommendations of social distancing due to COVID-19, a virtual visit is felt to be most appropriate for this patient at this time. Reviewed limitations, risks, security and privacy concerns of performing a virtual visit and the availability of in person appointments. I also reviewed that there may be a patient responsible charge related to this service. The patient agreed to proceed.   Patient location: home Provider location: [place] at [facility], office Persons participating in this virtual visit: patient, provider   If any vitals were documented, they were collected by patient at home unless specified below.    Temp 98.7 F (37.1 C) Comment: per patient  Wt 167 lb (75.8 kg) Comment: per patient  BMI 26.95 kg/m    CC: Diarrhea Subjective:   HPI: Carl Moore is a 40 y.o. male presenting on 04/24/2021 for Diarrhea (Started 04/22/21. No cramping or nausea present. Has diarrhea about 5 to 6 times a day sometimes more. Last night got up 5 times. No blood or mucus noted in the bowel.)  Symptoms started on 04/22/2021. No new medications. States he has been on several rounds of prednisone since being diagnosed with covid. States he can go up to 10 times in a 24 hour period. No blood or mucous in his stool per report. States mainly liquid with some solids in it. States he is still urinating approx 4-5 times daily and denies it being a dark color.  Has not tried anything over the counter for his symptoms  States his daughter has had a stomach bug over the last week. No other sick contacts that he is aware of. 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    Relevant past medical, surgical, family and social history reviewed and updated as indicated. Interim medical  history since our last visit reviewed. Allergies and medications reviewed and updated. Outpatient Medications Prior to Visit  Medication Sig Dispense Refill   fluticasone (FLONASE) 50 MCG/ACT nasal spray Place 1 spray into both nostrils 2 (two) times daily. (Patient taking differently: Place 1 spray into both nostrils as needed.) 16 g 0   Levocetirizine Dihydrochloride (XYZAL PO) Take 1 tablet by mouth as needed (seasonal allergies).     sertraline (ZOLOFT) 50 MG tablet Take 1 tablet (50 mg total) by mouth daily. For anxiety. 90 tablet 0   No facility-administered medications prior to visit.     Per HPI unless specifically indicated in ROS section below Review of Systems  Constitutional:  Negative for chills and fever.  Respiratory:  Negative for shortness of breath.   Cardiovascular:  Negative for chest pain.  Gastrointestinal:  Negative for abdominal distention, abdominal pain, blood in stool, constipation, nausea and vomiting.  Skin:  Negative for color change.  Neurological:  Negative for dizziness, light-headedness and headaches.  Objective:  Temp 98.7 F (37.1 C) Comment: per patient  Wt 167 lb (75.8 kg) Comment: per patient  BMI 26.95 kg/m   Wt Readings from Last 3 Encounters:  04/24/21 167 lb (75.8 kg)  04/03/21 167 lb (75.8 kg)  03/16/21 175 lb (79.4 kg)       Physical exam: Gen: alert, NAD, not ill appearing Pulm: speaks in complete sentences without increased work of breathing Psych: normal mood, normal thought content  Results for orders placed or performed in visit on 04/03/21  Hemoglobin A1c  Result Value Ref Range   Hgb A1c MFr Bld 5.3 4.6 - 6.5 %  CBC  Result Value Ref Range   WBC 7.8 4.0 - 10.5 K/uL   RBC 5.54 4.22 - 5.81 Mil/uL   Platelets 434.0 (H) 150.0 - 400.0 K/uL   Hemoglobin 15.7 13.0 - 17.0 g/dL   HCT 48.8 39.0 - 52.0 %   MCV 88.0 78.0 - 100.0 fl   MCHC 32.2 30.0 - 36.0 g/dL   RDW 14.3 11.5 - 15.5 %  TSH  Result Value Ref Range   TSH  1.02 0.35 - 5.50 uIU/mL  Comprehensive metabolic panel  Result Value Ref Range   Sodium 138 135 - 145 mEq/L   Potassium 4.5 3.5 - 5.1 mEq/L   Chloride 103 96 - 112 mEq/L   CO2 28 19 - 32 mEq/L   Glucose, Bld 96 70 - 99 mg/dL   BUN 8 6 - 23 mg/dL   Creatinine, Ser 1.19 0.40 - 1.50 mg/dL   Total Bilirubin 0.5 0.2 - 1.2 mg/dL   Alkaline Phosphatase 63 39 - 117 U/L   AST 17 0 - 37 U/L   ALT 27 0 - 53 U/L   Total Protein 6.1 6.0 - 8.3 g/dL   Albumin 3.7 3.5 - 5.2 g/dL   GFR 75.70 >60.00 mL/min   Calcium 9.0 8.4 - 10.5 mg/dL   Assessment & Plan:   Problem List Items Addressed This Visit       Other   Diarrhea - Primary    Patient had acute onset diarrhea on 04/22/2021.  No preceding event he is aware of.  Unsure of sick contacts as he works with the public.  Did state his daughter had a stomach bug the week before.  States he is having 5-10 loose watery stools daily.  Still able to eat and drink per patient report also urinating 4-5 times a day.  Upon review does have a incidental finding of diverticulosis without diagnosis of diverticulitis.  Patient is not having any abdominal pain currently.  Did give him strict precautions verbally and on AVS for return to clinic evaluation.  Told him diarrhea will likely need to run its course did mention the possibility of using loperamide over-the-counter.  Continue to monitor        No orders of the defined types were placed in this encounter.  No orders of the defined types were placed in this encounter.   I discussed the assessment and treatment plan with the patient. The patient was provided an opportunity to ask questions and all were answered. The patient agreed with the plan and demonstrated an understanding of the instructions. The patient was advised to call back or seek an in-person evaluation if the symptoms worsen or if the condition fails to improve as anticipated.  Follow up plan: Return if symptoms worsen or fail to  improve.  This visit occurred during the SARS-CoV-2 public health emergency.  Safety protocols were in place, including screening questions prior to the visit, additional usage of staff PPE, and extensive cleaning of exam room while observing appropriate contact time as indicated for disinfecting solutions.   Romilda Garret, NP

## 2021-04-25 ENCOUNTER — Telehealth: Payer: Commercial Managed Care - PPO | Admitting: Family Medicine

## 2021-04-25 MED ORDER — PREDNISONE 20 MG PO TABS: ORAL_TABLET | ORAL | 0 refills | Status: DC

## 2021-05-10 MED ORDER — DULOXETINE HCL 30 MG PO CPEP: 30.0000 mg | ORAL_CAPSULE | Freq: Every day | ORAL | 1 refills | Status: DC

## 2021-05-17 MED ORDER — DULOXETINE HCL 60 MG PO CPEP: 60.0000 mg | ORAL_CAPSULE | Freq: Every day | ORAL | 0 refills | Status: DC

## 2021-05-20 ENCOUNTER — Emergency Department (HOSPITAL_COMMUNITY): Payer: No Typology Code available for payment source

## 2021-05-20 ENCOUNTER — Other Ambulatory Visit: Payer: Self-pay

## 2021-05-20 ENCOUNTER — Emergency Department (HOSPITAL_COMMUNITY)
Admission: EM | Admit: 2021-05-20 | Discharge: 2021-05-20 | Disposition: A | Discharge status: Home / Self Care | Payer: No Typology Code available for payment source | Attending: Emergency Medicine | Admitting: Emergency Medicine

## 2021-05-20 ENCOUNTER — Encounter (HOSPITAL_COMMUNITY): Payer: Self-pay | Admitting: Emergency Medicine

## 2021-05-20 DIAGNOSIS — Z79899 Other long term (current) drug therapy: Secondary | ICD-10-CM | POA: Diagnosis not present

## 2021-05-20 DIAGNOSIS — Z23 Encounter for immunization: Secondary | ICD-10-CM | POA: Insufficient documentation

## 2021-05-20 DIAGNOSIS — Z8616 Personal history of COVID-19: Secondary | ICD-10-CM | POA: Insufficient documentation

## 2021-05-20 DIAGNOSIS — S0101XA Laceration without foreign body of scalp, initial encounter: Secondary | ICD-10-CM | POA: Diagnosis not present

## 2021-05-20 DIAGNOSIS — S0990XA Unspecified injury of head, initial encounter: Secondary | ICD-10-CM

## 2021-05-20 DIAGNOSIS — S00532A Contusion of oral cavity, initial encounter: Secondary | ICD-10-CM

## 2021-05-20 HISTORY — DX: Anxiety disorder, unspecified: F41.9

## 2021-05-20 MED ORDER — TETANUS-DIPHTH-ACELL PERTUSSIS 5-2.5-18.5 LF-MCG/0.5 IM SUSY
0.5000 mL | PREFILLED_SYRINGE | Freq: Once | INTRAMUSCULAR | Status: AC
  Administered 2021-05-20: 0.5 mL via INTRAMUSCULAR
  Filled 2021-05-20: qty 0.5

## 2021-05-20 MED ORDER — HYDROCODONE-ACETAMINOPHEN 5-325 MG PO TABS
1.0000 | ORAL_TABLET | Freq: Once | ORAL | Status: AC
Start: 2021-05-20 — End: 2021-05-20
  Administered 2021-05-20: 1 via ORAL
  Filled 2021-05-20: qty 1

## 2021-05-20 MED ORDER — IBUPROFEN 600 MG PO TABS: 600.0000 mg | ORAL_TABLET | Freq: Four times a day (QID) | ORAL | 0 refills | Status: DC | PRN
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
## 2021-05-20 NOTE — ED Notes (Signed)
Scratch on head cleaned with NS and guaze. Bandage applied.
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
## 2021-05-24 ENCOUNTER — Other Ambulatory Visit: Payer: Self-pay | Admitting: Primary Care

## 2021-05-24 DIAGNOSIS — F411 Generalized anxiety disorder: Secondary | ICD-10-CM

## 2021-05-29 DIAGNOSIS — F411 Generalized anxiety disorder: Secondary | ICD-10-CM

## 2021-05-29 DIAGNOSIS — G4700 Insomnia, unspecified: Secondary | ICD-10-CM

## 2021-05-30 MED ORDER — GABAPENTIN 100 MG PO CAPS: ORAL_CAPSULE | ORAL | 0 refills | Status: DC

## 2021-06-21 ENCOUNTER — Other Ambulatory Visit: Payer: Self-pay | Admitting: Primary Care

## 2021-06-21 DIAGNOSIS — F411 Generalized anxiety disorder: Secondary | ICD-10-CM

## 2021-07-21 ENCOUNTER — Other Ambulatory Visit: Payer: Self-pay | Admitting: Primary Care

## 2021-07-21 DIAGNOSIS — F411 Generalized anxiety disorder: Secondary | ICD-10-CM

## 2021-08-15 ENCOUNTER — Telehealth (INDEPENDENT_AMBULATORY_CARE_PROVIDER_SITE_OTHER): Payer: Commercial Managed Care - PPO | Admitting: Family Medicine

## 2021-08-15 ENCOUNTER — Encounter: Payer: Self-pay | Admitting: Family Medicine

## 2021-08-15 ENCOUNTER — Other Ambulatory Visit: Payer: Self-pay

## 2021-08-15 VITALS — Temp 98.7°F | Ht 66.0 in | Wt 175.0 lb

## 2021-08-15 DIAGNOSIS — J208 Acute bronchitis due to other specified organisms: Secondary | ICD-10-CM

## 2021-08-15 MED ORDER — AZITHROMYCIN 250 MG PO TABS: ORAL_TABLET | ORAL | 0 refills | Status: AC

## 2021-08-15 NOTE — Progress Notes (Signed)
Jakki Doughty T. Amiaya Mcneeley, MD Primary Care and Sports Medicine [facility] at [facility] [place], [address] Phone: [phone]   FAX: [place] - 41 y.o. male   MRN [id number]   Date of Birth: 01-20-1981  Visit Date: 08/15/2021   PCP: Pleas [name], NP   Referred by: Pleas [name], NP  Virtual Visit via Video Note:  I connected with  Carl Moore on 08/15/2021 11:20 AM EST by a video enabled telemedicine application and verified that I am speaking with the correct person using two identifiers.   Location patient: home computer, tablet, or smartphone Location provider: work or home office Consent: Verbal consent directly obtained from Alicia participating in the virtual visit: patient, provider  I discussed the limitations of evaluation and management by telemedicine and the availability of in person appointments. The patient expressed understanding and agreed to proceed.  Chief Complaint  Patient presents with   Chest Congestion    Going on for months- Tested for Covid on Sunday that was negative   Cough    With green/clear phlegm.  An occasional tinge of blood-Symptom of cough started last week    History of Present Illness:  Daughter had flu a few weeks ago, never tested, but he thinks he started to feel bad back then. 02/2021 tested positive for Covid. 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 He also did a COVID test on Sunday 3 days ago, and this was negative.  The last month, has tried things with some heavy congestion.  Nothing has really helped it much. Now cough has some more prod in his chest.  Productive of sputum and some occasional blood-tinged.  He is not a smoker, and he has no significant other inhaled complicating measures.  Daughter did have confirmed flu. Covid regular checks for law enforcement.  Rare some chills.  No  GI.\Decreased PO solid Appetite is down.    Review of Systems as above: See pertinent positives and pertinent negatives per HPI No acute distress verbally   Observations/Objective/Exam:  An attempt was made to discern vital signs over the phone and per patient if applicable and possible.   General:    Alert, Oriented, appears well and in no acute distress  Pulmonary:     On inspection no signs of respiratory distress.  Psych / Neurological:     Pleasant and cooperative.  Assessment and Plan:    ICD-10-CM   1. Acute bronchitis due to other specified organisms  J20.8      3 to 4-week worsening cough.  Possible that he did have a preceding flu, cannot be excluded.  Nevertheless with worsening and worsening production of cough, breathing, think that is prudent to cover him for atypicals and will place the patient on some Zithromax.  Continue to use other cough and cold medications rest as able and supportive care.  I discussed the assessment and treatment plan with the patient. The patient was provided an opportunity to ask questions and all were answered. The patient agreed with the plan and demonstrated an understanding of the instructions.   The patient was advised to call back or seek an in-person evaluation if the symptoms worsen or if the condition fails to improve as anticipated.  Follow-up: prn unless noted otherwise below No follow-ups on file.  Meds ordered this encounter  Medications   azithromycin (ZITHROMAX) 250 MG  tablet    Sig: Take 2 tablets (500 mg total) by mouth daily for 1 day, THEN 1 tablet (250 mg total) daily for 4 days.    Dispense:  6 tablet    Refill:  0   No orders of the defined types were placed in this encounter.   Signed,  Maud Deed. Usiel Astarita, MD

## 2021-08-23 ENCOUNTER — Ambulatory Visit
Admission: RE | Admit: 2021-08-23 | Discharge: 2021-08-23 | Disposition: A | Discharge status: Home / Self Care | Payer: Commercial Managed Care - PPO | Source: Ambulatory Visit | Attending: Nurse Practitioner | Admitting: Nurse Practitioner

## 2021-08-23 ENCOUNTER — Other Ambulatory Visit: Payer: Self-pay

## 2021-08-23 ENCOUNTER — Other Ambulatory Visit: Payer: Self-pay | Admitting: Nurse Practitioner

## 2021-08-23 ENCOUNTER — Telehealth (INDEPENDENT_AMBULATORY_CARE_PROVIDER_SITE_OTHER): Payer: Commercial Managed Care - PPO | Admitting: Nurse Practitioner

## 2021-08-23 ENCOUNTER — Encounter: Payer: Self-pay | Admitting: Nurse Practitioner

## 2021-08-23 ENCOUNTER — Ambulatory Visit
Admission: RE | Admit: 2021-08-23 | Discharge: 2021-08-23 | Disposition: A | Discharge status: Home / Self Care | Payer: Commercial Managed Care - PPO | Attending: Nurse Practitioner | Admitting: Nurse Practitioner

## 2021-08-23 VITALS — Temp 99.6°F

## 2021-08-23 DIAGNOSIS — R0602 Shortness of breath: Secondary | ICD-10-CM

## 2021-08-23 DIAGNOSIS — R0789 Other chest pain: Secondary | ICD-10-CM | POA: Diagnosis not present

## 2021-08-23 DIAGNOSIS — R058 Other specified cough: Secondary | ICD-10-CM

## 2021-08-23 DIAGNOSIS — R051 Acute cough: Secondary | ICD-10-CM | POA: Diagnosis present

## 2021-08-23 MED ORDER — GUAIFENESIN ER 600 MG PO TB12: 600.0000 mg | ORAL_TABLET | Freq: Two times a day (BID) | ORAL | 0 refills | Status: AC

## 2021-08-23 MED ORDER — PREDNISONE 20 MG PO TABS
ORAL_TABLET | ORAL | 0 refills | Status: AC
Start: 2021-08-23 — End: 2021-08-29

## 2021-08-23 NOTE — Progress Notes (Signed)
Patient ID: Carl Moore, male    DOB: 12-Mar-1981, 41 y.o.   MRN: [id number]  Virtual visit completed through [employer], a video enabled telemedicine application. Due to national recommendations of social distancing due to COVID-19, a virtual visit is felt to be most appropriate for this patient at this time. Reviewed limitations, risks, security and privacy concerns of performing a virtual visit and the availability of in person appointments. I also reviewed that there may be a patient responsible charge related to this service. The patient agreed to proceed.   Patient location: home Provider location: [place] at [facility], office Persons participating in this virtual visit: patient, provider   If any vitals were documented, they were collected by patient at home unless specified below.    Temp 99.6 F (37.6 C) Comment: per patient   CC: Chest congestion  Subjective:   HPI: Carl Moore is a 41 y.o. male presenting on 08/23/2021 for chest congestion (Had VV with Dr Lorelei Pont on 08/15/21- was given ZPak. This did not help at all. Feels very fatigue. Coughing up phlegm -yellowish/green/bloody at times. No sore throat.)   Symptoms have been going on for over a month now. States that he has been spitting up clear, green, yellow and bloody tinge. States that he was evaluated by Dr. Lorelei Pont  on 08/15/2021 he tested for covid last week and it was negative. States that he was given a z-pak. He has completed the course and it did not make any difference in his symptoms. Monday or Tuesday states that he started feeling worse.  States that he feels a tightness on his chest. Has been around folks that have tested positive for covid  States that he has periods of remission and then symptoms would come back   Pfizer vaccine x 2 and one booster Has not had the flu vaccine At home covid test was negative  Mucinex and robitussin OTC without relief   Relevant past medical, surgical, family  and social history reviewed and updated as indicated. Interim medical history since our last visit reviewed. Allergies and medications reviewed and updated. Outpatient Medications Prior to Visit  Medication Sig Dispense Refill   doxepin (SINEQUAN) 10 MG capsule Take 10 mg by mouth at bedtime.     DULoxetine (CYMBALTA) 60 MG capsule Take 1 capsule (60 mg total) by mouth daily. For anxiety. 90 capsule 0   fluticasone (FLONASE) 50 MCG/ACT nasal spray Place 1 spray into both nostrils 2 (two) times daily as needed for allergies or rhinitis.     ibuprofen (ADVIL) 600 MG tablet Take 1 tablet (600 mg total) by mouth every 6 (six) hours as needed. 30 tablet 0   Levocetirizine Dihydrochloride (XYZAL PO) Take 1 tablet by mouth as needed (seasonal allergies).     No facility-administered medications prior to visit.     Per HPI unless specifically indicated in ROS section below Review of Systems  Constitutional:  Positive for appetite change, chills, fatigue and fever.  HENT:  Positive for congestion. Negative for ear discharge, ear pain, sinus pressure, sinus pain and sore throat.   Respiratory:  Positive for cough, chest tightness and shortness of breath (at rest and with activity).   Cardiovascular:  Negative for chest pain.  Gastrointestinal:  Negative for abdominal pain, diarrhea, nausea and vomiting.  Musculoskeletal:  Negative for arthralgias and myalgias.  Neurological:  Positive for headaches. Negative for dizziness and light-headedness.  Objective:  Temp 99.6 F (37.6 C) Comment: per patient  Wt  Readings from Last 3 Encounters:  08/15/21 175 lb (79.4 kg)  05/20/21 172 lb (78 kg)  04/24/21 167 lb (75.8 kg)       Physical exam: Gen: alert, NAD, not ill appearing Pulm: speaks in complete sentences without increased work of breathing Psych: normal mood, normal thought content      Results for orders placed or performed in visit on 04/03/21  Hemoglobin A1c  Result Value Ref Range    Hgb A1c MFr Bld 5.3 4.6 - 6.5 %  CBC  Result Value Ref Range   WBC 7.8 4.0 - 10.5 K/uL   RBC 5.54 4.22 - 5.81 Mil/uL   Platelets 434.0 (H) 150.0 - 400.0 K/uL   Hemoglobin 15.7 13.0 - 17.0 g/dL   HCT 48.8 39.0 - 52.0 %   MCV 88.0 78.0 - 100.0 fl   MCHC 32.2 30.0 - 36.0 g/dL   RDW 14.3 11.5 - 15.5 %  TSH  Result Value Ref Range   TSH 1.02 0.35 - 5.50 uIU/mL  Comprehensive metabolic panel  Result Value Ref Range   Sodium 138 135 - 145 mEq/L   Potassium 4.5 3.5 - 5.1 mEq/L   Chloride 103 96 - 112 mEq/L   CO2 28 19 - 32 mEq/L   Glucose, Bld 96 70 - 99 mg/dL   BUN 8 6 - 23 mg/dL   Creatinine, Ser 1.19 0.40 - 1.50 mg/dL   Total Bilirubin 0.5 0.2 - 1.2 mg/dL   Alkaline Phosphatase 63 39 - 117 U/L   AST 17 0 - 37 U/L   ALT 27 0 - 53 U/L   Total Protein 6.1 6.0 - 8.3 g/dL   Albumin 3.7 3.5 - 5.2 g/dL   GFR 75.70 >60.00 mL/min   Calcium 9.0 8.4 - 10.5 mg/dL   Assessment & Plan:   Problem List Items Addressed This Visit       Other   Acute cough - Primary    Having a productive cough.  Patient is non-smoker.  Recently finished azithromycin without benefit.  Pending COVID test today we will get chest x-ray if negative      Chest tightness   Shortness of breath    Having at rest and with movement.  Pending at home COVID test.  If COVID test negative we will pursue chest x-ray as this is been going on for approximately 1 month.  With periods of waxing and waning        No orders of the defined types were placed in this encounter.  Orders Placed This Encounter  Procedures   DG Chest 2 View    Standing Status:   Future    Standing Expiration Date:   08/23/2022    Order Specific Question:   Reason for Exam (SYMPTOM  OR DIAGNOSIS REQUIRED)    Answer:   productive cough for 1 month post Zpak use.    Order Specific Question:   Preferred imaging location?    Answer:   [facility]    I discussed the assessment and treatment plan with the patient. The patient was provided an  opportunity to ask questions and all were answered. The patient agreed with the plan and demonstrated an understanding of the instructions. The patient was advised to call back or seek an in-person evaluation if the symptoms worsen or if the condition fails to improve as anticipated.  Follow up plan: No follow-ups on file.  Romilda Garret, NP

## 2021-08-23 NOTE — Assessment & Plan Note (Signed)
Having a productive cough.  Patient is non-smoker.  Recently finished azithromycin without benefit.  Pending COVID test today we will get chest x-ray if negative

## 2021-08-23 NOTE — Assessment & Plan Note (Signed)
Having at rest and with movement.  Pending at home COVID test.  If COVID test negative we will pursue chest x-ray as this is been going on for approximately 1 month.  With periods of waxing and waning

## 2021-08-27 NOTE — Telephone Encounter (Signed)
It is fine to give him a work note for today

## 2021-09-03 ENCOUNTER — Encounter: Payer: Self-pay | Admitting: Nurse Practitioner

## 2021-09-03 ENCOUNTER — Telehealth: Payer: Commercial Managed Care - PPO | Admitting: Nurse Practitioner

## 2021-09-03 ENCOUNTER — Other Ambulatory Visit: Payer: Self-pay

## 2021-09-03 ENCOUNTER — Ambulatory Visit (INDEPENDENT_AMBULATORY_CARE_PROVIDER_SITE_OTHER): Payer: Commercial Managed Care - PPO | Admitting: Nurse Practitioner

## 2021-09-03 VITALS — BP 134/88 | HR 83 | Temp 98.8°F | Resp 14 | Ht 66.0 in | Wt 171.2 lb

## 2021-09-03 DIAGNOSIS — H6123 Impacted cerumen, bilateral: Secondary | ICD-10-CM | POA: Diagnosis not present

## 2021-09-03 DIAGNOSIS — R0602 Shortness of breath: Secondary | ICD-10-CM

## 2021-09-03 LAB — CBC WITH DIFFERENTIAL/PLATELET
Basophils Absolute: 0.1 10*3/uL (ref 0.0–0.1)
Basophils Relative: 0.8 % (ref 0.0–3.0)
Eosinophils Absolute: 0.1 10*3/uL (ref 0.0–0.7)
Eosinophils Relative: 1.1 % (ref 0.0–5.0)
HCT: 52.8 % — ABNORMAL HIGH (ref 39.0–52.0)
Hemoglobin: 17.1 g/dL — ABNORMAL HIGH (ref 13.0–17.0)
Lymphocytes Relative: 16.8 % (ref 12.0–46.0)
Lymphs Abs: 1.1 10*3/uL (ref 0.7–4.0)
MCHC: 32.4 g/dL (ref 30.0–36.0)
MCV: 88.4 fl (ref 78.0–100.0)
Monocytes Absolute: 0.5 10*3/uL (ref 0.1–1.0)
Monocytes Relative: 7.8 % (ref 3.0–12.0)
Neutro Abs: 5 10*3/uL (ref 1.4–7.7)
Neutrophils Relative %: 73.5 % (ref 43.0–77.0)
Platelets: 421 10*3/uL — ABNORMAL HIGH (ref 150.0–400.0)
RBC: 5.98 Mil/uL — ABNORMAL HIGH (ref 4.22–5.81)
RDW: 14.4 % (ref 11.5–15.5)
WBC: 6.8 10*3/uL (ref 4.0–10.5)

## 2021-09-03 LAB — COMPREHENSIVE METABOLIC PANEL
ALT: 23 U/L (ref 0–53)
AST: 17 U/L (ref 0–37)
Albumin: 4 g/dL (ref 3.5–5.2)
Alkaline Phosphatase: 60 U/L (ref 39–117)
BUN: 10 mg/dL (ref 6–23)
CO2: 29 mEq/L (ref 19–32)
Calcium: 8.9 mg/dL (ref 8.4–10.5)
Chloride: 101 mEq/L (ref 96–112)
Creatinine, Ser: 1.07 mg/dL (ref 0.40–1.50)
GFR: 85.74 mL/min (ref >60.00)
Glucose, Bld: 74 mg/dL (ref 70–99)
Potassium: 4.8 mEq/L (ref 3.5–5.1)
Sodium: 134 mEq/L — ABNORMAL LOW (ref 135–145)
Total Bilirubin: 0.8 mg/dL (ref 0.2–1.2)
Total Protein: 6.8 g/dL (ref 6.0–8.3)

## 2021-09-03 LAB — BRAIN NATRIURETIC PEPTIDE: Pro B Natriuretic peptide (BNP): 7 pg/mL (ref 0.0–100.0)

## 2021-09-03 MED ORDER — ALBUTEROL SULFATE HFA 108 (90 BASE) MCG/ACT IN AERS: 2.0000 | INHALATION_SPRAY | Freq: Four times a day (QID) | RESPIRATORY_TRACT | 0 refills | Status: DC | PRN

## 2021-09-03 NOTE — Assessment & Plan Note (Signed)
Continue shortness of breath since illness in the middle of January.  Patient states he had COVID on 08/13/2021.  Patient was seen later in written azithromycin pack which she completed and no great change in symptoms.  Patient was seen virtually later on 08/23/2021 and written prednisone which did help.  States that nebulizer administered from the nurse at work was helpful.  Chest x-ray performed on 08/23/2021 was negative.  Patient has no cardiac history personally and no cardiovascular disease in the family.  Given it was responsive to steroids and albuterol for EKG in office.  Will write albuterol inhaler to use as needed.  Pending lab results.  Did discuss with patient if lab results are negative and response of 2 albuterol inhaler can continue to treat for obtain CT without contrast to take a look at lungs to make sure there is no scarring or interstitial lung disease.  Patient acknowledged

## 2021-09-03 NOTE — Progress Notes (Signed)
Acute Office Visit  Subjective:    Patient ID: Carl Moore, male    DOB: 05-Nov-1980, 41 y.o.   MRN: [id number]  Chief Complaint  Patient presents with   Shortness of Breath    Some chest congestion still, coughing up green phlegm, blowing out some bloody mucus at times. Has done a couple of breathing treatments through work that seem to help his breathing.     Patient is in today for Shortness of breath  Was evaluated on 08/23/2021 virtually and was prescribed some prednisone that helped per patient report. States that the nurse at work gave him some breathing treatments that also helped. But if he is not taking any medications it goes back to DOE.  States that he had covid in 02/2021 and then again on 08/13/2021. Did review the note on 08/15/2021 and it states that the covid test was negative and given some azithromycin. States that he has had shob since covid that medication did help but  once he is finished with medication he goes back to having shortness of breath. He also had a chest xray on 08/23/2021 that did not show any acute abnormality   Past Medical History:  Diagnosis Date   Anxiety    Chicken pox    Diverticulosis    Fatty liver    History of kidney stones     Past Surgical History:  Procedure Laterality Date   bone spur Left 02/2018   left big toe   KNEE ARTHROSCOPY Left    LIGAMENT REPAIR Left 2021   thumb (pinned)   ULNAR NERVE REPAIR Left 1999    Family History  Problem Relation Age of Onset   Hypertension Mother    Stroke Father        Deceased   Hypertension Father     Social History   Socioeconomic History   Marital status: Married    Spouse name: Not on file   Number of children: Not on file   Years of education: Not on file   Highest education level: Not on file  Occupational History   Not on file  Tobacco Use   Smoking status: Never   Smokeless tobacco: Never  Vaping Use   Vaping Use: Never used  Substance and Sexual  Activity   Alcohol use: No    Alcohol/week: 0.0 standard drinks   Drug use: No   Sexual activity: Yes  Other Topics Concern   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   No children.   Enjoys playing golf.   Social Determinants of Health   Financial Resource Strain: Not on file  Food Insecurity: Not on file  Transportation Needs: Not on file  Physical Activity: Not on file  Stress: Not on file  Social Connections: Not on file  Intimate Partner Violence: Not on file    Outpatient Medications Prior to Visit  Medication Sig Dispense Refill   doxepin (SINEQUAN) 10 MG capsule Take 10 mg by mouth at bedtime.     DULoxetine (CYMBALTA) 60 MG capsule Take 1 capsule (60 mg total) by mouth daily. For anxiety. 90 capsule 0   fluticasone (FLONASE) 50 MCG/ACT nasal spray Place 1 spray into both nostrils 2 (two) times daily as needed for allergies or rhinitis.     ibuprofen (ADVIL) 600 MG tablet Take 1 tablet (600 mg total) by mouth every 6 (six) hours as needed. 30 tablet 0   Levocetirizine  Dihydrochloride (XYZAL PO) Take 1 tablet by mouth as needed (seasonal allergies).     No facility-administered medications prior to visit.    Allergies  Allergen Reactions   Zoloft [Sertraline] Rash   Doxycycline Rash    Review of Systems  Constitutional:  Positive for fatigue. Negative for chills and fever.  HENT:  Positive for congestion and postnasal drip. Negative for ear discharge, ear pain, sinus pressure, sinus pain and sore throat.   Respiratory:  Positive for cough (green mucos) and shortness of breath (DOE).   Cardiovascular:  Negative for chest pain.  Gastrointestinal:  Negative for abdominal pain, diarrhea, nausea and vomiting.  Musculoskeletal:  Negative for arthralgias and myalgias.  Neurological:  Positive for headaches. Negative for dizziness and light-headedness.      Objective:    Physical Exam Vitals and nursing note reviewed.   Constitutional:      Appearance: He is well-developed.  HENT:     Right Ear: Ear canal and external ear normal. There is impacted cerumen.     Left Ear: Ear canal and external ear normal. There is impacted cerumen.     Nose:     Right Sinus: No maxillary sinus tenderness or frontal sinus tenderness.     Left Sinus: No maxillary sinus tenderness or frontal sinus tenderness.     Mouth/Throat:     Mouth: Mucous membranes are moist.     Pharynx: Oropharynx is clear.  Neck:     Thyroid: No thyromegaly.  Cardiovascular:     Rate and Rhythm: Normal rate and regular rhythm.     Pulses: Normal pulses.  Pulmonary:     Effort: Pulmonary effort is normal.     Breath sounds: Normal breath sounds.  Abdominal:     General: Bowel sounds are normal.  Musculoskeletal:     Cervical back: Normal range of motion.     Right lower leg: No edema.     Left lower leg: No edema.  Skin:    General: Skin is warm.  Neurological:     Mental Status: He is alert.    BP 134/88    Pulse 83    Temp 98.8 F (37.1 C)    Resp 14    Ht 5\' 6"  (1.676 m)    Wt 171 lb 4 oz (77.7 kg)    SpO2 97%    BMI 27.64 kg/m  Wt Readings from Last 3 Encounters:  09/03/21 171 lb 4 oz (77.7 kg)  08/15/21 175 lb (79.4 kg)  05/20/21 172 lb (78 kg)    Health Maintenance Due  Topic Date Due   HIV Screening  Never done   Hepatitis C Screening  Never done   COVID-19 Vaccine (4 - Booster for [place] series) 07/11/2020    There are no preventive care reminders to display for this patient.   Lab Results  Component Value Date   TSH 1.02 04/03/2021   Lab Results  Component Value Date   WBC 7.8 04/03/2021   HGB 15.7 04/03/2021   HCT 48.8 04/03/2021   MCV 88.0 04/03/2021   PLT 434.0 (H) 04/03/2021   Lab Results  Component Value Date   NA 138 04/03/2021   K 4.5 04/03/2021   CO2 28 04/03/2021   GLUCOSE 96 04/03/2021   BUN 8 04/03/2021   CREATININE 1.19 04/03/2021   BILITOT 0.5 04/03/2021   ALKPHOS 63 04/03/2021   AST  17 04/03/2021   ALT 27 04/03/2021   PROT 6.1 04/03/2021   ALBUMIN  3.7 04/03/2021   CALCIUM 9.0 04/03/2021   ANIONGAP 8 03/06/2020   GFR 75.70 04/03/2021   Lab Results  Component Value Date   CHOL 197 11/04/2019   Lab Results  Component Value Date   HDL 44.60 11/04/2019   Lab Results  Component Value Date   LDLCALC 124 (H) 03/16/2015   Lab Results  Component Value Date   TRIG 231.0 (H) 11/04/2019   Lab Results  Component Value Date   CHOLHDL 4 11/04/2019   Lab Results  Component Value Date   HGBA1C 5.3 04/03/2021       Assessment & Plan:   Problem List Items Addressed This Visit       Nervous and Auditory   Cerumen impaction    Bilateral cerumen impaction.  Patient gave verbal consent after cerumen softening eardrops were placed in bilateral ears.  After a period ears were irrigated using water and hydroperoxide combination per office policy.  Patient tolerated procedure well.  Post irrigation canal and TMs within normal limit.  Did give precautions in regards to possible ear canal soreness.  Patient acknowledged      Relevant Orders   Ear Lavage     Other   Shortness of breath - Primary    Continue shortness of breath since illness in the middle of January.  Patient states he had COVID on 08/13/2021.  Patient was seen later in written azithromycin pack which she completed and no great change in symptoms.  Patient was seen virtually later on 08/23/2021 and written prednisone which did help.  States that nebulizer administered from the nurse at work was helpful.  Chest x-ray performed on 08/23/2021 was negative.  Patient has no cardiac history personally and no cardiovascular disease in the family.  Given it was responsive to steroids and albuterol for EKG in office.  Will write albuterol inhaler to use as needed.  Pending lab results.  Did discuss with patient if lab results are negative and response of 2 albuterol inhaler can continue to treat for obtain CT without  contrast to take a look at lungs to make sure there is no scarring or interstitial lung disease.  Patient acknowledged      Relevant Medications   albuterol (VENTOLIN HFA) 108 (90 Base) MCG/ACT inhaler   Other Relevant Orders   CBC with Differential/Platelet   Comprehensive metabolic panel   D-dimer, quantitative   Brain natriuretic peptide     Meds ordered this encounter  Medications   albuterol (VENTOLIN HFA) 108 (90 Base) MCG/ACT inhaler    Sig: Inhale 2 puffs into the lungs every 6 (six) hours as needed for wheezing or shortness of breath.    Dispense:  8 g    Refill:  0    Order Specific Question:   Supervising Provider    Answer:   Loura Pardon A [[id number]]   This visit occurred during the SARS-CoV-2 public health emergency.  Safety protocols were in place, including screening questions prior to the visit, additional usage of staff PPE, and extensive cleaning of exam room while observing appropriate contact time as indicated for disinfecting solutions.    Romilda Garret, NP

## 2021-09-03 NOTE — Patient Instructions (Signed)
Nice to see you today I will be in touch in regards to the lab work Follow up if no improvement or symptoms worsen.

## 2021-09-03 NOTE — Assessment & Plan Note (Signed)
Bilateral cerumen impaction.  Patient gave verbal consent after cerumen softening eardrops were placed in bilateral ears.  After a period ears were irrigated using water and hydroperoxide combination per office policy.  Patient tolerated procedure well.  Post irrigation canal and TMs within normal limit.  Did give precautions in regards to possible ear canal soreness.  Patient acknowledged

## 2021-09-04 LAB — D-DIMER, QUANTITATIVE: D-Dimer, Quant: <0.19 mcg/mL FEU (ref <0.50)

## 2021-10-15 NOTE — Telephone Encounter (Signed)
Carl Moore, I need to catch up with patient regarding his symptoms. ?I can see him either virtually or in person, but we need to meet. ?Okay to add him anywhere.  ?

## 2021-10-16 NOTE — Telephone Encounter (Signed)
Left message to return call to our office. Patient needs to be scheduled for office visit with kate  ?

## 2021-10-16 NOTE — Telephone Encounter (Signed)
Patient called back have set up for next week for virtual.  ?

## 2021-10-24 ENCOUNTER — Telehealth (INDEPENDENT_AMBULATORY_CARE_PROVIDER_SITE_OTHER): Payer: Commercial Managed Care - PPO | Admitting: Primary Care

## 2021-10-24 ENCOUNTER — Encounter: Payer: Self-pay | Admitting: Primary Care

## 2021-10-24 VITALS — Ht 66.0 in | Wt 175.0 lb

## 2021-10-24 DIAGNOSIS — F411 Generalized anxiety disorder: Secondary | ICD-10-CM | POA: Diagnosis not present

## 2021-10-24 DIAGNOSIS — G4700 Insomnia, unspecified: Secondary | ICD-10-CM | POA: Diagnosis not present

## 2021-10-24 MED ORDER — TRAZODONE HCL 100 MG PO TABS: 100.0000 mg | ORAL_TABLET | Freq: Every day | ORAL | 0 refills | Status: DC

## 2021-10-24 NOTE — Patient Instructions (Signed)
Continue citalopram daily for anxiety. ? ?Resume trazodone 100 mg.  Start with 1/2 tablet (50 mg) nightly for 3-5 nights, then increase to 100 mg nightly thereafter.  Please update me in a week as discussed. ? ?It was a pleasure to see you today! ? ?

## 2021-10-24 NOTE — Assessment & Plan Note (Signed)
Stable, controlled per patient. ? ?Continue citalopram 40 mg daily. ?No longer seeing psychiatry. ?

## 2021-10-24 NOTE — Assessment & Plan Note (Signed)
Uncontrolled, prefers to not follow with psychiatry. ? ?Fortunately anxiety has improved on citalopram 40 mg. ?Long discussion regarding options as he has failed numerous over-the-counter and prescription regimens for insomnia. ? ?Continue citalopram 40 mg daily for anxiety. ?Resume trazodone, 50 mg nightly for 3-5 nights, then increase to 100 mg nightly thereafter. ? ?He will update [place] via MyChart next week. ?Consider Ambien 5 mg if trazodone is ineffective. ?

## 2021-10-24 NOTE — Progress Notes (Signed)
? ? Patient ID: Carl Moore, male    DOB: 15-Feb-1981, 41 y.o.   MRN: [id number] ? ?Virtual visit completed through [employer], a video enabled telemedicine application. Due to national recommendations of social distancing due to COVID-19, a virtual visit is felt to be most appropriate for this patient at this time. Reviewed limitations, risks, security and privacy concerns of performing a virtual visit and the availability of in person appointments. I also reviewed that there may be a patient responsible charge related to this service. The patient agreed to proceed.  ? ?Patient location: home ?Provider location: Financial [facility] at [facility], office ?Persons participating in this virtual visit: patient, provider  ? ?If any vitals were documented, they were collected by patient at home unless specified below.   ? ?Ht '5\' 6"'$  (1.676 m)   Wt 175 lb (79.4 kg)   BMI 28.25 kg/m?   ? ?CC: Insomnia, Anxiety ?Subjective:  ? ?HPI: ?Carl Moore is a 41 y.o. male with a history of generalized anxiety disorder, insomnia, presenting on 10/24/2021 for Insomnia (Going on for last few years increased in last few months. ) ? ?Chronic insomnia and anxiety over the last several years. He has failed several prescription regimens for anxiety and insomnia including Lexapro, trazodone, mirtazapine, gabapentin. Currently following with psychiatry and is managed on citalopram 40 mg and doxepin 10 mg. He's not seen his psychiatrist in months.  ? ?Today he endorses doing well on citalopram 40 mg regarding his anxiety. He's noticed overall feeling calmer, less irritability. He continues to struggle with falling asleep, tosses and turns all night. He's not taken anything for sleep in over 1 month, hasn't been able to connect with his psychiatrist, doesn't want to follow with him due to cost.  ? ?His psychiatrist was going to place him on Ambien asked if doxepin was ineffective.  He has never taken Ambien before previously.  He is interested  in resuming trazodone as this was somewhat effective before. ? ?   ? ?Relevant past medical, surgical, family and social history reviewed and updated as indicated. Interim medical history since our last visit reviewed. ?Allergies and medications reviewed and updated. ?Outpatient Medications Prior to Visit  ?Medication Sig Dispense Refill  ? albuterol (VENTOLIN HFA) 108 (90 Base) MCG/ACT inhaler Inhale 2 puffs into the lungs every 6 (six) hours as needed for wheezing or shortness of breath. 8 g 0  ? fluticasone (FLONASE) 50 MCG/ACT nasal spray Place 1 spray into both nostrils 2 (two) times daily as needed for allergies or rhinitis.    ? ibuprofen (ADVIL) 600 MG tablet Take 1 tablet (600 mg total) by mouth every 6 (six) hours as needed. 30 tablet 0  ? Levocetirizine Dihydrochloride (XYZAL PO) Take 1 tablet by mouth as needed (seasonal allergies).    ? citalopram (CELEXA) 40 MG tablet Take 40 mg by mouth daily.    ? doxepin (SINEQUAN) 10 MG capsule Take 10 mg by mouth at bedtime. (Patient not taking: Reported on 10/24/2021)    ? DULoxetine (CYMBALTA) 60 MG capsule Take 1 capsule (60 mg total) by mouth daily. For anxiety. (Patient not taking: Reported on 10/24/2021) 90 capsule 0  ? ?No facility-administered medications prior to visit.  ?  ? ?Per HPI unless specifically indicated in ROS section below ?Review of Systems  ?Respiratory:  Negative for shortness of breath.   ?Cardiovascular:  Negative for chest pain.  ?Psychiatric/Behavioral:  Positive for sleep disturbance. Negative for suicidal ideas.   ?  Anxiety improving.  ?Objective:  ?Ht '5\' 6"'$  (1.676 m)   Wt 175 lb (79.4 kg)   BMI 28.25 kg/m?   ?Wt Readings from Last 3 Encounters:  ?10/24/21 175 lb (79.4 kg)  ?09/03/21 171 lb 4 oz (77.7 kg)  ?08/15/21 175 lb (79.4 kg)  ?  ?  ? ?Physical exam: ?General: Alert and oriented x 3, no distress, does not appear sickly ? ?Pulmonary: Speaks in complete sentences without increased work of breathing, no cough during  visit. ? ?Psychiatric: Normal mood, thought content, and behavior. ? ?   ?Results for orders placed or performed in visit on 09/03/21  ?CBC with Differential/Platelet  ?Result Value Ref Range  ? WBC 6.8 4.0 - 10.5 K/uL  ? RBC 5.98 (H) 4.22 - 5.81 Mil/uL  ? Hemoglobin 17.1 (H) 13.0 - 17.0 g/dL  ? HCT 52.8 (H) 39.0 - 52.0 %  ? MCV 88.4 78.0 - 100.0 fl  ? MCHC 32.4 30.0 - 36.0 g/dL  ? RDW 14.4 11.5 - 15.5 %  ? Platelets 421.0 (H) 150.0 - 400.0 K/uL  ? Neutrophils Relative % 73.5 43.0 - 77.0 %  ? Lymphocytes Relative 16.8 12.0 - 46.0 %  ? Monocytes Relative 7.8 3.0 - 12.0 %  ? Eosinophils Relative 1.1 0.0 - 5.0 %  ? Basophils Relative 0.8 0.0 - 3.0 %  ? Neutro Abs 5.0 1.4 - 7.7 K/uL  ? Lymphs Abs 1.1 0.7 - 4.0 K/uL  ? Monocytes Absolute 0.5 0.1 - 1.0 K/uL  ? Eosinophils Absolute 0.1 0.0 - 0.7 K/uL  ? Basophils Absolute 0.1 0.0 - 0.1 K/uL  ?Comprehensive metabolic panel  ?Result Value Ref Range  ? Sodium 134 (L) 135 - 145 mEq/L  ? Potassium 4.8 3.5 - 5.1 mEq/L  ? Chloride 101 96 - 112 mEq/L  ? CO2 29 19 - 32 mEq/L  ? Glucose, Bld 74 70 - 99 mg/dL  ? BUN 10 6 - 23 mg/dL  ? Creatinine, Ser 1.07 0.40 - 1.50 mg/dL  ? Total Bilirubin 0.8 0.2 - 1.2 mg/dL  ? Alkaline Phosphatase 60 39 - 117 U/L  ? AST 17 0 - 37 U/L  ? ALT 23 0 - 53 U/L  ? Total Protein 6.8 6.0 - 8.3 g/dL  ? Albumin 4.0 3.5 - 5.2 g/dL  ? GFR 85.74 >60.00 mL/min  ? Calcium 8.9 8.4 - 10.5 mg/dL  ?D-dimer, quantitative  ?Result Value Ref Range  ? D-Dimer, Quant <0.19 <0.50 mcg/mL FEU  ?Brain natriuretic peptide  ?Result Value Ref Range  ? Pro B Natriuretic peptide (BNP) 7.0 0.0 - 100.0 pg/mL  ? ?Assessment & Plan:  ? ?Problem List Items Addressed This Visit   ? ?  ? Other  ? Insomnia - Primary  ?  Uncontrolled, prefers to not follow with psychiatry. ? ?Fortunately anxiety has improved on citalopram 40 mg. ?Long discussion regarding options as he has failed numerous over-the-counter and prescription regimens for insomnia. ? ?Continue citalopram 40 mg daily for  anxiety. ?Resume trazodone, 50 mg nightly for 3-5 nights, then increase to 100 mg nightly thereafter. ? ?He will update [place] via MyChart next week. ?Consider Ambien 5 mg if trazodone is ineffective. ?  ?  ? Relevant Medications  ? traZODone (DESYREL) 100 MG tablet  ? GAD (generalized anxiety disorder)  ?  Stable, controlled per patient. ? ?Continue citalopram 40 mg daily. ?No longer seeing psychiatry. ?  ?  ? Relevant Medications  ? citalopram (CELEXA) 40 MG tablet  ? traZODone (  DESYREL) 100 MG tablet  ?  ? ?Meds ordered this encounter  ?Medications  ? traZODone (DESYREL) 100 MG tablet  ?  Sig: Take 1 tablet (100 mg total) by mouth at bedtime. For sleep.  ?  Dispense:  90 tablet  ?  Refill:  0  ?  Order Specific Question:   Supervising Provider  ?  Answer:   BEDSOLE, AMY E [[id number]]  ? ?No orders of the defined types were placed in this encounter. ? ? ?I discussed the assessment and treatment plan with the patient. The patient was provided an opportunity to ask questions and all were answered. The patient agreed with the plan and demonstrated an understanding of the instructions. The patient was advised to call back or seek an in-person evaluation if the symptoms worsen or if the condition fails to improve as anticipated. ? ?Follow up plan: ? ?Continue citalopram daily for anxiety. ? ?Resume trazodone 100 mg.  Start with 1/2 tablet (50 mg) nightly for 3-5 nights, then increase to 100 mg nightly thereafter.  Please update me in a week as discussed. ? ?It was a pleasure to see you today! ? ?Pleas [name], NP  ? ?

## 2021-11-05 DIAGNOSIS — G4700 Insomnia, unspecified: Secondary | ICD-10-CM

## 2021-11-22 NOTE — Telephone Encounter (Signed)
Called patient let know that I am sending messaged for Carl Moore to review but she is out of the office until next week. Will call back if any urgent information needed.  ?

## 2021-11-22 NOTE — Telephone Encounter (Signed)
Patient says yes, he is taking 200 mg of trazadone ? ?Patient is ready to switch to [place] ? ?He is unable to get into mychart, please call the patient  [phone] ?

## 2021-11-25 ENCOUNTER — Emergency Department (HOSPITAL_BASED_OUTPATIENT_CLINIC_OR_DEPARTMENT_OTHER)
Admission: EM | Admit: 2021-11-25 | Discharge: 2021-11-25 | Disposition: A | Discharge status: Home / Self Care | Payer: Commercial Managed Care - PPO | Attending: Emergency Medicine | Admitting: Emergency Medicine

## 2021-11-25 ENCOUNTER — Encounter (HOSPITAL_BASED_OUTPATIENT_CLINIC_OR_DEPARTMENT_OTHER): Payer: Self-pay | Admitting: Emergency Medicine

## 2021-11-25 ENCOUNTER — Other Ambulatory Visit: Payer: Self-pay

## 2021-11-25 DIAGNOSIS — R1084 Generalized abdominal pain: Secondary | ICD-10-CM | POA: Diagnosis present

## 2021-11-25 DIAGNOSIS — R197 Diarrhea, unspecified: Secondary | ICD-10-CM | POA: Diagnosis not present

## 2021-11-25 DIAGNOSIS — Z20822 Contact with and (suspected) exposure to covid-19: Secondary | ICD-10-CM | POA: Diagnosis not present

## 2021-11-25 DIAGNOSIS — R112 Nausea with vomiting, unspecified: Secondary | ICD-10-CM | POA: Insufficient documentation

## 2021-11-25 DIAGNOSIS — D75839 Thrombocytosis, unspecified: Secondary | ICD-10-CM | POA: Insufficient documentation

## 2021-11-25 DIAGNOSIS — D72829 Elevated white blood cell count, unspecified: Secondary | ICD-10-CM | POA: Diagnosis not present

## 2021-11-25 LAB — CBC WITH DIFFERENTIAL/PLATELET
Abs Immature Granulocytes: 0.14 10*3/uL — ABNORMAL HIGH (ref 0.00–0.07)
Basophils Absolute: 0.1 10*3/uL (ref 0.0–0.1)
Basophils Relative: 1 %
Eosinophils Absolute: 0.1 10*3/uL (ref 0.0–0.5)
Eosinophils Relative: 1 %
HCT: 51.1 % (ref 39.0–52.0)
Hemoglobin: 16.6 g/dL (ref 13.0–17.0)
Immature Granulocytes: 1 %
Lymphocytes Relative: 13 %
Lymphs Abs: 1.4 10*3/uL (ref 0.7–4.0)
MCH: 28.4 pg (ref 26.0–34.0)
MCHC: 32.5 g/dL (ref 30.0–36.0)
MCV: 87.5 fL (ref 80.0–100.0)
Monocytes Absolute: 0.9 10*3/uL (ref 0.1–1.0)
Monocytes Relative: 8 %
Neutro Abs: 8.2 10*3/uL — ABNORMAL HIGH (ref 1.7–7.7)
Neutrophils Relative %: 76 %
Platelets: 442 10*3/uL — ABNORMAL HIGH (ref 150–400)
RBC: 5.84 MIL/uL — ABNORMAL HIGH (ref 4.22–5.81)
RDW: 14.5 % (ref 11.5–15.5)
WBC: 10.8 10*3/uL — ABNORMAL HIGH (ref 4.0–10.5)
nRBC: 0 % (ref 0.0–0.2)

## 2021-11-25 LAB — URINALYSIS, ROUTINE W REFLEX MICROSCOPIC
Bilirubin Urine: NEGATIVE
Glucose, UA: NEGATIVE mg/dL
Ketones, ur: NEGATIVE mg/dL
Leukocytes,Ua: NEGATIVE
Nitrite: NEGATIVE
Protein, ur: NEGATIVE mg/dL
Specific Gravity, Urine: 1.019 (ref 1.005–1.030)
pH: 6.5 (ref 5.0–8.0)

## 2021-11-25 LAB — COMPREHENSIVE METABOLIC PANEL
ALT: 38 U/L (ref 0–44)
AST: 27 U/L (ref 15–41)
Albumin: 3.9 g/dL (ref 3.5–5.0)
Alkaline Phosphatase: 52 U/L (ref 38–126)
Anion gap: 8 (ref 5–15)
BUN: 9 mg/dL (ref 6–20)
CO2: 27 mmol/L (ref 22–32)
Calcium: 8.6 mg/dL — ABNORMAL LOW (ref 8.9–10.3)
Chloride: 100 mmol/L (ref 98–111)
Creatinine, Ser: 1.15 mg/dL (ref 0.61–1.24)
GFR, Estimated: >60 mL/min (ref >60)
Glucose, Bld: 116 mg/dL — ABNORMAL HIGH (ref 70–99)
Potassium: 4 mmol/L (ref 3.5–5.1)
Sodium: 135 mmol/L (ref 135–145)
Total Bilirubin: 0.9 mg/dL (ref 0.3–1.2)
Total Protein: 6.6 g/dL (ref 6.5–8.1)

## 2021-11-25 LAB — RESP PANEL BY RT-PCR (FLU A&B, COVID) ARPGX2
Influenza A by PCR: NEGATIVE
Influenza B by PCR: NEGATIVE
SARS Coronavirus 2 by RT PCR: NEGATIVE

## 2021-11-25 LAB — LIPASE, BLOOD: Lipase: 51 U/L (ref 11–51)

## 2021-11-25 MED ORDER — LACTATED RINGERS IV BOLUS
1000.0000 mL | Freq: Once | INTRAVENOUS | Status: AC
  Administered 2021-11-25: 1000 mL via INTRAVENOUS

## 2021-11-25 MED ORDER — ONDANSETRON HCL 4 MG/2ML IJ SOLN
4.0000 mg | Freq: Once | INTRAMUSCULAR | Status: AC
  Administered 2021-11-25: 4 mg via INTRAVENOUS
  Filled 2021-11-25: qty 2

## 2021-11-25 MED ORDER — ONDANSETRON 4 MG PO TBDP: 4.0000 mg | ORAL_TABLET | Freq: Three times a day (TID) | ORAL | 0 refills | Status: DC | PRN

## 2021-11-25 NOTE — ED Notes (Signed)
Dc instructions and scripts reviewed with pt no questions or concerns at this time. Will follow up with pcp.ambulated with steady gait out of ed.  ?

## 2021-11-25 NOTE — ED Triage Notes (Signed)
Mid abdominal pain , NVD x last night . ? ?Also reports depression x 1 year.  Denies suicidal ideations . ? 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

## 2021-11-25 NOTE — ED Notes (Signed)
RT note: V/S updated upon room assignment. ?

## 2021-11-25 NOTE — ED Provider Notes (Signed)
?[facility] ?Provider Note ? ? ?CSN: [id number] ?Arrival date & time: 11/25/21  1735 ? ?  ? ?History ? ?Chief Complaint  ?Patient presents with  ? Abdominal Pain  ? ? ?Carl Moore is a 41 y.o. male. ? ? ?Diarrhea ?Associated symptoms: abdominal pain and vomiting   ?Patient presents for diarrhea.  This has been ongoing for the past 48 hours.  Last night, he did develop nausea and vomiting.  He has still been able to tolerate p.o. intake.  He has had very minimal generalized abdominal pain.  His last episode of diarrhea was at 2 PM.  He denies any vomiting today but states that he does still have some mild nausea.  He denies any fevers, chills, or dysuria.  He has any presence of blood or darkness to his stools.  He has not taken anything at home for symptomatic relief of the diarrhea or the nausea.  His medical history includes cholecystectomy, anxiety, and chronic pain. ? ?Home Medications ?Prior to Admission medications   ?Medication Sig Start Date End Date Taking? Authorizing Provider  ?ondansetron (ZOFRAN-ODT) 4 MG disintegrating tablet Take 1 tablet (4 mg total) by mouth every 8 (eight) hours as needed for nausea or vomiting. 11/25/21  Yes Godfrey Pick, MD  ?albuterol (VENTOLIN HFA) 108 (90 Base) MCG/ACT inhaler Inhale 2 puffs into the lungs every 6 (six) hours as needed for wheezing or shortness of breath. 09/03/21   Michela Pitcher, NP  ?citalopram (CELEXA) 40 MG tablet TAKE 1 TABLET BY MOUTH ONCE DAILY FOR ANXIETY 11/26/21   Pleas [name], NP  ?fluticasone (FLONASE) 50 MCG/ACT nasal spray Place 1 spray into both nostrils 2 (two) times daily as needed for allergies or rhinitis.    [provider]  ?ibuprofen (ADVIL) 600 MG tablet Take 1 tablet (600 mg total) by mouth every 6 (six) hours as needed. 05/20/21   Horton, Barbette Hair, MD  ?Levocetirizine Dihydrochloride (XYZAL PO) Take 1 tablet by mouth as needed (seasonal allergies).    [provider]  ?traZODone  (DESYREL) 100 MG tablet Take 1 tablet (100 mg total) by mouth at bedtime. For sleep. 10/24/21   Pleas [name], NP  ?   ? ?Allergies    ?Zoloft [sertraline] and Doxycycline   ? ?Review of Systems   ?Review of Systems  ?Gastrointestinal:  Positive for abdominal pain, diarrhea, nausea and vomiting.  ?All other systems reviewed and are negative. ? ?Physical Exam ?Updated Vital Signs ?BP 131/83 (BP Location: Right Arm)   Pulse 78   Temp 98.1 ?F (36.7 ?C) (Oral)   Resp 20   Ht '5\' 6"'$  (1.676 m)   Wt 81.6 kg   SpO2 96%   BMI 29.05 kg/m?  ?Physical Exam ?Vitals and nursing note reviewed.  ?Constitutional:   ?   General: He is not in acute distress. ?   Appearance: He is well-developed and normal weight. He is not ill-appearing, toxic-appearing or diaphoretic.  ?HENT:  ?   Head: Normocephalic and atraumatic.  ?   Mouth/Throat:  ?   Mouth: Mucous membranes are moist.  ?   Pharynx: Oropharynx is clear.  ?Eyes:  ?   General: No scleral icterus. ?   Conjunctiva/sclera: Conjunctivae normal.  ?   Pupils: Pupils are equal, round, and reactive to light.  ?Cardiovascular:  ?   Rate and Rhythm: Normal rate and regular rhythm.  ?   Heart sounds: No murmur heard. ?Pulmonary:  ?   Effort: Pulmonary effort  is normal. No respiratory distress.  ?Abdominal:  ?   Palpations: Abdomen is soft.  ?   Tenderness: There is no abdominal tenderness.  ?Musculoskeletal:     ?   General: No swelling.  ?   Cervical back: Neck supple.  ?Skin: ?   General: Skin is warm and dry.  ?   Capillary Refill: Capillary refill takes less than 2 seconds.  ?   Coloration: Skin is not cyanotic or jaundiced.  ?Neurological:  ?   General: No focal deficit present.  ?   Mental Status: He is alert and oriented to person, place, and time.  ?   Cranial Nerves: No cranial nerve deficit.  ?   Motor: No weakness.  ?Psychiatric:     ?   Mood and Affect: Mood normal.     ?   Behavior: Behavior normal.  ? ? ?ED Results / Procedures / Treatments   ?Labs ?(all labs ordered  are listed, but only abnormal results are displayed) ?Labs Reviewed  ?COMPREHENSIVE METABOLIC PANEL - Abnormal; Notable for the following components:  ?    Result Value  ? Glucose, Bld 116 (*)   ? Calcium 8.6 (*)   ? All other components within normal limits  ?CBC WITH DIFFERENTIAL/PLATELET - Abnormal; Notable for the following components:  ? WBC 10.8 (*)   ? RBC 5.84 (*)   ? Platelets 442 (*)   ? Neutro Abs 8.2 (*)   ? Abs Immature Granulocytes 0.14 (*)   ? All other components within normal limits  ?URINALYSIS, ROUTINE W REFLEX MICROSCOPIC - Abnormal; Notable for the following components:  ? Hgb urine dipstick SMALL (*)   ? All other components within normal limits  ?RESP PANEL BY RT-PCR (FLU A&B, COVID) ARPGX2  ?LIPASE, BLOOD  ? ? ?EKG ?EKG Interpretation ? ?Date/Time:  Sunday November 25 2021 19:19:02 EDT ?Ventricular Rate:  89 ?PR Interval:  118 ?QRS Duration: 92 ?QT Interval:  340 ?QTC Calculation: 413 ?R Axis:   37 ?Text Interpretation: Normal sinus rhythm Normal ECG When compared with ECG of 05-May-2019 01:40, PREVIOUS ECG IS PRESENT Confirmed by Godfrey Pick [phone]) on 11/25/2021 8:04:32 PM ? ?Radiology ?No results found. ? ?Procedures ?Procedures  ? ? ?Medications Ordered in ED ?Medications  ?lactated ringers bolus 1,000 mL (0 mLs Intravenous Stopped 11/25/21 2224)  ?ondansetron [facility]) injection 4 mg (4 mg Intravenous Given 11/25/21 2024)  ? ? ?ED Course/ Medical Decision Making/ A&P ?  ?                        ?Medical Decision Making ?Amount and/or Complexity of Data Reviewed ?Labs: ordered. ? ?Risk ?Prescription drug management. ? ? ?Patient is a healthy 41 year old male presenting for 2 days of diarrhea.  He also experienced nausea and vomiting last night and has some mild continued nausea at this time.  His diarrhea is described as loose and brown without evidence of hematochezia or melena.  On arrival in the ED, vital signs are notable for hypertension.  He is afebrile and well-appearing on exam.  His  abdomen is soft and he denies any tenderness.  Given his recent symptoms, patient was given bolus of IV fluids.  Laboratory work-up was initiated to assess for electrolyte abnormalities.  On laboratory work-up, patient has a mild leukocytosis and thrombocytosis but results are otherwise normal.  He was given Zofran for his mild nausea and was able to tolerate p.o. intake in the ED.  I suspect  the patient has a viral gastroenteritis.  He was advised to continue to stay hydrated.  As needed Zofran was prescribed.  He was encouraged to return for any worsening of symptoms.  He was discharged in good condition. ? ? ? ? ? ? ? ?Final Clinical Impression(s) / ED Diagnoses ?Final diagnoses:  ?Nausea vomiting and diarrhea  ? ? ?Rx / DC Orders ?ED Discharge Orders   ? ?      Ordered  ?  ondansetron (ZOFRAN-ODT) 4 MG disintegrating tablet  Every 8 hours PRN       ? 11/25/21 2216  ? ?  ?  ? ?  ? ? ?  ?Godfrey Pick, MD ?11/27/21 1205 ? ?

## 2021-11-26 ENCOUNTER — Other Ambulatory Visit: Payer: Self-pay | Admitting: Primary Care

## 2021-11-26 DIAGNOSIS — F411 Generalized anxiety disorder: Secondary | ICD-10-CM

## 2021-11-26 DIAGNOSIS — G4700 Insomnia, unspecified: Secondary | ICD-10-CM

## 2021-11-27 ENCOUNTER — Other Ambulatory Visit: Payer: Self-pay | Admitting: Primary Care

## 2021-11-27 DIAGNOSIS — G4700 Insomnia, unspecified: Secondary | ICD-10-CM

## 2021-11-27 NOTE — Progress Notes (Signed)
Please call patient: ? ?I will send a prescription in for zolpidem (Ambien) 5 mg tablets for sleep.  Take 1 tablet by mouth 30 minutes prior to sleep.  Have him update me in a few weeks. ?

## 2021-11-27 NOTE — Telephone Encounter (Signed)
For some reason the system will not allow me to send Ambien electronically.  ?Please call in a prescription for zolpidem 5 mg tablets.  Take 1 tablet by mouth at bedtime for sleep.  #30, no refills. ? ?See notes under MyChart encounter and relay information to patient. ? ?

## 2021-11-28 NOTE — Telephone Encounter (Signed)
Called and spoke w/ pt informed of his rx was sent his pharmacy and informed when to take medication , advised to follow up with [place]  how medication is working for him. ?

## 2021-11-29 MED ORDER — ZOLPIDEM TARTRATE 5 MG PO TABS: 5.0000 mg | ORAL_TABLET | Freq: Every evening | ORAL | 0 refills | Status: DC | PRN

## 2021-11-29 NOTE — Telephone Encounter (Signed)
Do not see script in chart. What is the quantity and did you want refills?  ?

## 2021-11-29 NOTE — Telephone Encounter (Signed)
I will try to resend the Ambien prescription now. ? ?Please call and notify patient that I sent a prescription for Ambien 5 mg for him to take at bedtime for sleep. ? ?We will need to update me in a couple of weeks. ?

## 2021-12-10 NOTE — Telephone Encounter (Signed)
Spoke to patient by telephone and was advised that he has been taking Ambien now for about a week. ?Patient stated that the anxiety medication that he is taking seems to be having an adverse reaction on him and bringing him down. ?Patient stated that he needs an appointment with [name] NP to discuss the medication. Patient scheduled for an appointment to see [name] NP 12/14/21 at 2:40 pm. ?

## 2021-12-14 ENCOUNTER — Encounter: Payer: Self-pay | Admitting: Primary Care

## 2021-12-14 ENCOUNTER — Ambulatory Visit (INDEPENDENT_AMBULATORY_CARE_PROVIDER_SITE_OTHER): Payer: Commercial Managed Care - PPO | Admitting: Primary Care

## 2021-12-14 VITALS — BP 150/82 | HR 62 | Temp 98.6°F | Ht 66.0 in | Wt 171.0 lb

## 2021-12-14 DIAGNOSIS — G4700 Insomnia, unspecified: Secondary | ICD-10-CM | POA: Diagnosis not present

## 2021-12-14 DIAGNOSIS — F411 Generalized anxiety disorder: Secondary | ICD-10-CM

## 2021-12-14 MED ORDER — BUPROPION HCL ER (XL) 150 MG PO TB24: 150.0000 mg | ORAL_TABLET | Freq: Every day | ORAL | 0 refills | Status: DC

## 2021-12-14 NOTE — Assessment & Plan Note (Signed)
Improved!  Continue Zolpidem 5 mg daily.

## 2021-12-14 NOTE — Patient Instructions (Signed)
Start bupropion XL 150 mg daily. Take 1 tablet by mouth every morning for depression and focus.  Please update me in 1 month.  It was a pleasure to see you today!

## 2021-12-14 NOTE — Progress Notes (Signed)
Subjective:    Patient ID: Carl Moore, male    DOB: 14-Aug-1980, 41 y.o.   MRN: [id number]  HPI  Carl Moore is a very pleasant 41 y.o. male with a history of GAD, insomnia who presents today to discuss anxiety.   Chronic for the last 6 months, progressing over time. He is undergoing a divorce and has been more stressed.   Currently managed on citalopram 40 mg daily for which he has taken for numerous months.  Today he mentions that he's not taken citalopram for one week, he stopped this himself without notifying us. He doesn't feel any different off of citalopram than when he was taking. He does remember citalopram helping at one point.   Previously managed on duloxetine, sertraline, Lexapro for anxiety and either felt no improvement or could not tolerate. Previously following with psychiatry but this became cost prohibitive.  Symptoms include difficulty focusing, little motivation to do anything, feeling nervous.   He is sleeping well since he was initiated on Ambien 5 mg daily. He is pleased with these results.   BP Readings from Last 3 Encounters:  12/14/21 (!) 150/82  11/25/21 131/83  09/03/21 134/88        04/03/2021    9:48 AM 09/13/2020    7:18 AM  GAD 7 : Generalized Anxiety Score  Nervous, Anxious, on Edge 3 3  Control/stop worrying 2 2  Worry too much - different things 3 3  Trouble relaxing 3 3  Restless 3 3  Easily annoyed or irritable 3 1  Afraid - awful might happen 1 0  Total GAD 7 Score 18 15  Anxiety Difficulty Somewhat difficult Somewhat difficult         12/14/2021    2:31 PM 11/08/2020    8:16 AM 09/13/2020    7:17 AM  PHQ9 SCORE ONLY  PHQ-9 Total Score '2 2 8    '$ Review of Systems  Respiratory:  Negative for shortness of breath.   Cardiovascular:  Negative for chest pain.  Psychiatric/Behavioral:  Positive for decreased concentration. Negative for sleep disturbance. The patient is nervous/anxious.        Depression         Past  Medical History:  Diagnosis Date   Anxiety    Chicken pox    COVID-19 virus infection 03/16/2021   Diverticulosis    Fatty liver    History of kidney stones     Social History   Socioeconomic History   Marital status: Married    Spouse name: Not on file   Number of children: Not on file   Years of education: Not on file   Highest education level: Not on file  Occupational History   Not on file  Tobacco Use   Smoking status: Never   Smokeless tobacco: Never  Vaping Use   Vaping Use: Never used  Substance and Sexual Activity   Alcohol use: No    Alcohol/week: 0.0 standard drinks   Drug use: No   Sexual activity: Yes  Other Topics Concern   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   No children.   Enjoys playing golf.   Social Determinants of Health   Financial Resource Strain: Not on file  Food Insecurity: Not on file  Transportation Needs: Not on file  Physical Activity: Not on file  Stress: Not on file  Social Connections: Not on file  Intimate Partner Violence: Not  on file    Past Surgical History:  Procedure Laterality Date   bone spur Left 02/2018   left big toe   CHOLECYSTECTOMY     KNEE ARTHROSCOPY Left    LIGAMENT REPAIR Left 2021   thumb (pinned)   ULNAR NERVE REPAIR Left 1999    Family History  Problem Relation Age of Onset   Hypertension Mother    Stroke Father        Deceased   Hypertension Father     Allergies  Allergen Reactions   Zoloft [Sertraline] Rash   Doxycycline Rash    Current Outpatient Medications on File Prior to Visit  Medication Sig Dispense Refill   albuterol (VENTOLIN HFA) 108 (90 Base) MCG/ACT inhaler Inhale 2 puffs into the lungs every 6 (six) hours as needed for wheezing or shortness of breath. 8 g 0   fluticasone (FLONASE) 50 MCG/ACT nasal spray Place 1 spray into both nostrils 2 (two) times daily as needed for allergies or rhinitis.     ibuprofen (ADVIL) 600 MG tablet  Take 1 tablet (600 mg total) by mouth every 6 (six) hours as needed. 30 tablet 0   Levocetirizine Dihydrochloride (XYZAL PO) Take 1 tablet by mouth as needed (seasonal allergies).     zolpidem (AMBIEN) 5 MG tablet Take 1 tablet (5 mg total) by mouth at bedtime as needed for sleep. 30 tablet 0   No current facility-administered medications on file prior to visit.    BP (!) 150/82   Pulse 62   Temp 98.6 F (37 C) (Oral)   Ht '5\' 6"'$  (1.676 m)   Wt 171 lb (77.6 kg)   SpO2 98%   BMI 27.60 kg/m  Objective:   Physical Exam Cardiovascular:     Rate and Rhythm: Normal rate and regular rhythm.  Pulmonary:     Effort: Pulmonary effort is normal.     Breath sounds: Normal breath sounds. No wheezing or rales.  Musculoskeletal:     Cervical back: Neck supple.  Skin:    General: Skin is warm and dry.  Neurological:     Mental Status: He is alert and oriented to person, place, and time.          Assessment & Plan:      This visit occurred during the SARS-CoV-2 public health emergency.  Safety protocols were in place, including screening questions prior to the visit, additional usage of staff PPE, and extensive cleaning of exam room while observing appropriate contact time as indicated for disinfecting solutions.

## 2021-12-14 NOTE — Assessment & Plan Note (Signed)
Symptoms today more representative of depression, but he does have some anxiety.  Discussed to never discontinue a medication without my consent.   Remain off citalopram 40 mg for now. Start bupropion XL 150 mg daily.   Consider resuming citalopram 20 mg or adding Buspar 10 mg BID if needed.  He will follow up in 1 month via MyChart.

## 2021-12-28 ENCOUNTER — Other Ambulatory Visit: Payer: Self-pay | Admitting: Primary Care

## 2021-12-28 DIAGNOSIS — G4700 Insomnia, unspecified: Secondary | ICD-10-CM

## 2022-01-22 DIAGNOSIS — S42202B Unspecified fracture of upper end of left humerus, initial encounter for open fracture: Secondary | ICD-10-CM | POA: Insufficient documentation

## 2022-01-22 HISTORY — DX: Unspecified fracture of upper end of left humerus, initial encounter for open fracture: S42.202B

## 2022-01-24 DIAGNOSIS — W3400XA Accidental discharge from unspecified firearms or gun, initial encounter: Secondary | ICD-10-CM

## 2022-01-24 DIAGNOSIS — F32A Depression, unspecified: Secondary | ICD-10-CM | POA: Insufficient documentation

## 2022-01-24 HISTORY — DX: Accidental discharge from unspecified firearms or gun, initial encounter: W34.00XA

## 2022-01-31 DIAGNOSIS — S41032A Puncture wound without foreign body of left shoulder, initial encounter: Secondary | ICD-10-CM

## 2022-01-31 HISTORY — DX: Puncture wound without foreign body of left shoulder, initial encounter: S41.032A

## 2022-04-18 ENCOUNTER — Other Ambulatory Visit: Payer: Self-pay | Admitting: Primary Care

## 2022-04-18 DIAGNOSIS — G4700 Insomnia, unspecified: Secondary | ICD-10-CM

## 2022-04-22 ENCOUNTER — Other Ambulatory Visit: Payer: Self-pay

## 2022-04-22 ENCOUNTER — Other Ambulatory Visit: Payer: Self-pay | Admitting: Primary Care

## 2022-04-22 DIAGNOSIS — G4700 Insomnia, unspecified: Secondary | ICD-10-CM

## 2022-04-22 MED ORDER — ZOLPIDEM TARTRATE 5 MG PO TABS: 5.0000 mg | ORAL_TABLET | Freq: Every evening | ORAL | 0 refills | Status: DC | PRN

## 2022-04-22 NOTE — Telephone Encounter (Signed)
Patient called states that he is working out of town. He tried to get them to transfer script of Ambien but pharmacy will need new one sent over. He has been out for about 4 days. Would like sent to   [facility] in [place] I have updated temporary pharmacy   [phone]

## 2022-06-17 ENCOUNTER — Other Ambulatory Visit: Payer: Self-pay | Admitting: Primary Care

## 2022-06-17 DIAGNOSIS — F411 Generalized anxiety disorder: Secondary | ICD-10-CM

## 2022-11-14 ENCOUNTER — Encounter: Payer: Self-pay | Admitting: Primary Care

## 2022-11-14 ENCOUNTER — Ambulatory Visit (INDEPENDENT_AMBULATORY_CARE_PROVIDER_SITE_OTHER): Payer: Medicaid Other | Admitting: Primary Care

## 2022-11-14 VITALS — BP 132/88 | HR 75 | Temp 99.5°F | Ht 66.0 in | Wt 172.0 lb

## 2022-11-14 DIAGNOSIS — F331 Major depressive disorder, recurrent, moderate: Secondary | ICD-10-CM | POA: Diagnosis not present

## 2022-11-14 DIAGNOSIS — Z0000 Encounter for general adult medical examination without abnormal findings: Secondary | ICD-10-CM

## 2022-11-14 DIAGNOSIS — F411 Generalized anxiety disorder: Secondary | ICD-10-CM

## 2022-11-14 DIAGNOSIS — G4700 Insomnia, unspecified: Secondary | ICD-10-CM

## 2022-11-14 DIAGNOSIS — J302 Other seasonal allergic rhinitis: Secondary | ICD-10-CM

## 2022-11-14 MED ORDER — ZOLPIDEM TARTRATE 5 MG PO TABS: 5.0000 mg | ORAL_TABLET | Freq: Every evening | ORAL | 0 refills | Status: DC | PRN

## 2022-11-14 MED ORDER — DULOXETINE HCL 30 MG PO CPEP: 30.0000 mg | ORAL_CAPSULE | Freq: Every day | ORAL | 0 refills | Status: DC

## 2022-11-14 NOTE — Progress Notes (Signed)
Subjective:    Patient ID: Carl Moore, male    DOB: [birth year], 42 y.o.   MRN: [id number]  HPI  Carl Moore is a very pleasant 42 y.o. male who presents today for complete physical and follow up of chronic conditions.  He has been off of Welbutrin XL and Ambien for months. Since being off of Wellbutrin he's noticed feeling down, feeling anxious, irritability, difficulty sleeping. He was once managed on citalopram but this lost its effectiveness. He had an allergic reaction on Zoloft, full body hives. He has not tried Cymbalta.   Immunizations: -Tetanus: Completed in 2022  Diet: Fair diet.  Exercise: Exercising 4 days weekly   Eye exam: Completes annually  Dental exam: Completes semi-annually     BP Readings from Last 3 Encounters:  11/14/22 132/88  12/14/21 (!) 150/82  11/25/21 131/83       Review of Systems  Constitutional:  Negative for unexpected weight change.  HENT:  Negative for rhinorrhea.   Respiratory:  Negative for cough and shortness of breath.   Cardiovascular:  Negative for chest pain.  Gastrointestinal:  Negative for constipation and diarrhea.  Genitourinary:  Negative for difficulty urinating.  Musculoskeletal:  Negative for arthralgias and myalgias.  Skin:  Negative for rash.  Allergic/Immunologic: Positive for environmental allergies.  Neurological:  Negative for dizziness and headaches.  Psychiatric/Behavioral:  Positive for sleep disturbance. The patient is nervous/anxious.          Past Medical History:  Diagnosis Date   Anxiety    Cerumen impaction 01/24/2020   Chicken pox    Continuous RUQ abdominal pain 01/24/2020   COVID-19 virus infection 03/16/2021   Diverticulosis    Fatty liver    History of kidney stones    Symptomatic cholelithiasis 03/06/2020   On contrasted CT abd/pelvis 12/2019    Social History   Socioeconomic History   Marital status: Married    Spouse name: Not on file   Number of children: Not on file    Years of education: Not on file   Highest education level: Not on file  Occupational History   Not on file  Tobacco Use   Smoking status: Never   Smokeless tobacco: Never  Vaping Use   Vaping Use: Never used  Substance and Sexual Activity   Alcohol use: No    Alcohol/week: 0.0 standard drinks of alcohol   Drug use: No   Sexual activity: Yes  Other Topics Concern   Not on file  Social History Narrative   Works as Administrator.   Married. Newly wed.   No children.   Enjoys playing golf.   Social Determinants of Health   Financial Resource Strain: Not on file  Food Insecurity: Not on file  Transportation Needs: Not on file  Physical Activity: Not on file  Stress: Not on file  Social Connections: Not on file  Intimate Partner Violence: Not on file    Past Surgical History:  Procedure Laterality Date   bone spur Left 02/2018   left big toe   CHOLECYSTECTOMY     KNEE ARTHROSCOPY Left    LIGAMENT REPAIR Left 2021   thumb (pinned)   ULNAR NERVE REPAIR Left 1999    Family History  Problem Relation Age of Onset   Hypertension Mother    Stroke Father        Deceased   Hypertension Father     Allergies  Allergen Reactions   Zoloft [Sertraline] Rash   Doxycycline Rash  Current Outpatient Medications on File Prior to Visit  Medication Sig Dispense Refill   albuterol (VENTOLIN HFA) 108 (90 Base) MCG/ACT inhaler Inhale 2 puffs into the lungs every 6 (six) hours as needed for wheezing or shortness of breath. 8 g 0   fluticasone (FLONASE) 50 MCG/ACT nasal spray Place 1 spray into both nostrils 2 (two) times daily as needed for allergies or rhinitis.     ibuprofen (ADVIL) 600 MG tablet Take 1 tablet (600 mg total) by mouth every 6 (six) hours as needed. 30 tablet 0   Levocetirizine Dihydrochloride (XYZAL PO) Take 1 tablet by mouth as needed (seasonal allergies).     No current facility-administered medications on file prior to visit.    BP 132/88   Pulse 75    Temp 99.5 F (37.5 C) (Temporal)   Ht  (1.676 m)   Wt 172 lb (78 kg)   SpO2 98%   BMI 27.76 kg/m  Objective:   Physical Exam HENT:     Right Ear: Tympanic membrane and ear canal normal.     Left Ear: Tympanic membrane and ear canal normal.     Nose: Nose normal.     Right Sinus: No maxillary sinus tenderness or frontal sinus tenderness.     Left Sinus: No maxillary sinus tenderness or frontal sinus tenderness.  Eyes:     Conjunctiva/sclera: Conjunctivae normal.  Neck:     Thyroid: No thyromegaly.     Vascular: No carotid bruit.  Cardiovascular:     Rate and Rhythm: Normal rate and regular rhythm.     Heart sounds: Normal heart sounds.  Pulmonary:     Effort: Pulmonary effort is normal.     Breath sounds: Normal breath sounds. No wheezing or rales.  Abdominal:     General: Bowel sounds are normal.     Palpations: Abdomen is soft.     Tenderness: There is no abdominal tenderness.  Musculoskeletal:        General: Normal range of motion.     Cervical back: Neck supple.  Skin:    General: Skin is warm and dry.  Neurological:     Mental Status: He is alert and oriented to person, place, and time.     Cranial Nerves: No cranial nerve deficit.     Deep Tendon Reflexes: Reflexes are normal and symmetric.  Psychiatric:        Mood and Affect: Mood normal.           Assessment & Plan:  Preventative health care Assessment & Plan: Immunizations UTD.  Discussed the importance of a healthy diet and regular exercise in order for weight loss, and to reduce the risk of further co-morbidity.  Exam stable. Declines labs today.  Follow up in 1 year for repeat physical.    Insomnia, unspecified type Assessment & Plan: Uncontrolled, but out of zolpidem.  Resume zolpidem 5 mg HS. Refills provided.  Orders: -     Zolpidem Tartrate; Take 1 tablet (5 mg total) by mouth at bedtime as needed. for sleep  Dispense: 90 tablet; Refill: 0  GAD (generalized anxiety  disorder) Assessment & Plan: Uncontrolled.  Citalopram and Wellbutrin became ineffective. Allergic reaction to Zoloft.  Start Cymbalta 30 mg daily. He will update in 4-6 weeks.  Orders: -     DULoxetine HCl; Take 1 capsule (30 mg total) by mouth daily. for anxiety and depression.  Dispense: 90 capsule; Refill: 0  Moderate episode of recurrent major depressive disorder Assessment &  Plan: Uncontrolled.  Citalopram and Wellbutrin became ineffective. Allergic reaction to Zoloft.  Start Cymbalta 30 mg daily. He will update in 4-6 weeks.  Orders: -     DULoxetine HCl; Take 1 capsule (30 mg total) by mouth daily. for anxiety and depression.  Dispense: 90 capsule; Refill: 0  Seasonal allergies Assessment & Plan: Controlled.  Continue Flonase, albuterol inhaler PRN, Xyzal PRN         Doreene Nest, NP

## 2022-11-14 NOTE — Assessment & Plan Note (Signed)
Uncontrolled.  Citalopram and Wellbutrin became ineffective. Allergic reaction to Zoloft.  Start Cymbalta 30 mg daily. He will update in 4-6 weeks. 

## 2022-11-14 NOTE — Patient Instructions (Signed)
Start duloxetine 30 mg daily for anxiety and depression.  Resume your Ambien as discussed.  Please update me in 4-6 weeks.  It was a pleasure to see you today!

## 2022-11-14 NOTE — Assessment & Plan Note (Addendum)
Immunizations UTD.  Discussed the importance of a healthy diet and regular exercise in order for weight loss, and to reduce the risk of further co-morbidity.  Exam stable. Declines labs today.  Follow up in 1 year for repeat physical.  

## 2022-11-14 NOTE — Assessment & Plan Note (Signed)
Controlled.  Continue Flonase, albuterol inhaler PRN, Xyzal PRN

## 2022-11-14 NOTE — Assessment & Plan Note (Signed)
Uncontrolled.  Citalopram and Wellbutrin became ineffective. Allergic reaction to Zoloft.  Start Cymbalta 30 mg daily. He will update in 4-6 weeks.

## 2022-11-14 NOTE — Assessment & Plan Note (Signed)
Uncontrolled, but out of zolpidem.  Resume zolpidem 5 mg HS. Refills provided.

## 2022-11-28 ENCOUNTER — Telehealth: Payer: Self-pay | Admitting: Primary Care

## 2022-11-28 DIAGNOSIS — F411 Generalized anxiety disorder: Secondary | ICD-10-CM

## 2022-11-28 NOTE — Telephone Encounter (Signed)
Pt called in stated he stop taking RX  DULoxetine (CYMBALTA) 30 MG capsule  . Stated his side effect was Vomiting, Diarrhea nausea blurred vision . Wants to know if there something else he could take . Please advise [phone]

## 2022-11-28 NOTE — Telephone Encounter (Signed)
Left message to return call to our office.  

## 2022-11-28 NOTE — Telephone Encounter (Signed)
Please notify patient and I am sorry to hear about the symptoms, thank him for the update.  When did he stop taking Cymbalta?  We are running out of options, we can try a medication called fluoxetine (Prozac) for anxiety and depression.  Let me know if he is willing to try.

## 2022-11-29 MED ORDER — FLUOXETINE HCL 20 MG PO TABS: 20.0000 mg | ORAL_TABLET | Freq: Every day | ORAL | 0 refills | Status: DC

## 2022-11-29 NOTE — Telephone Encounter (Signed)
Patient called back he stopped Cymbalta on 4/26.  He would like to try the Prozac. Would like called into [facility] [address]

## 2022-11-29 NOTE — Addendum Note (Signed)
Addended by: Doreene Nest on: 11/29/2022 01:40 PM   Modules accepted: Orders

## 2022-11-29 NOTE — Telephone Encounter (Signed)
Noted, prescription for fluoxetine 20 mg sent to pharmacy.  Have him start with 1/2 tablet daily for a few days, then increase to 1 full tablet thereafter.  Have him update me via MyChart or phone call in 1 month.

## 2022-11-29 NOTE — Telephone Encounter (Signed)
Called patient reviewed all information and repeated back to me. He will get in touch in about a month and let office know how he is doing on medication.

## 2022-12-03 ENCOUNTER — Telehealth: Payer: Self-pay | Admitting: Primary Care

## 2022-12-03 DIAGNOSIS — F411 Generalized anxiety disorder: Secondary | ICD-10-CM

## 2022-12-03 NOTE — Telephone Encounter (Addendum)
Patient went to pick up his  FLUoxetine (PROZAC) 20 MG tablet, told it needed provider approval  Off meds for 2.5 weeks, need to be on something  Please advise.

## 2022-12-04 ENCOUNTER — Other Ambulatory Visit (HOSPITAL_COMMUNITY): Payer: Self-pay

## 2022-12-04 MED ORDER — FLUOXETINE HCL 20 MG PO CAPS: 20.0000 mg | ORAL_CAPSULE | Freq: Every day | ORAL | 0 refills | Status: DC

## 2022-12-04 NOTE — Telephone Encounter (Signed)
Patient has not started medication. Per message he was told needed it needed a PA.   Pharmacy tech stated Fluoxetine 20mg  capsules are covered rather than tablets. Okay to switch to capsules?

## 2022-12-04 NOTE — Telephone Encounter (Signed)
Noted, Rx sent to pharmacy. 

## 2022-12-04 NOTE — Telephone Encounter (Signed)
Patient called in to follow up on this. He stated that he needs his medication and been out for while. Thank you!

## 2022-12-04 NOTE — Telephone Encounter (Signed)
This is the first I am being notified of this.  Did he ever start fluoxetine?  It looks like his insurance is asking for capsules instead of tablets.

## 2022-12-04 NOTE — Telephone Encounter (Signed)
Fluoxetine 20 mg capsules are covered. Does pt require tablets?

## 2022-12-30 ENCOUNTER — Ambulatory Visit: Payer: Medicaid Other | Admitting: Family Medicine

## 2022-12-31 ENCOUNTER — Ambulatory Visit (INDEPENDENT_AMBULATORY_CARE_PROVIDER_SITE_OTHER): Payer: Medicaid Other | Admitting: Family

## 2022-12-31 ENCOUNTER — Encounter: Payer: Self-pay | Admitting: Family

## 2022-12-31 VITALS — BP 130/84 | HR 88 | Temp 98.8°F | Ht 66.0 in | Wt 178.2 lb

## 2022-12-31 DIAGNOSIS — H6123 Impacted cerumen, bilateral: Secondary | ICD-10-CM | POA: Insufficient documentation

## 2022-12-31 HISTORY — DX: Impacted cerumen, bilateral: H61.23

## 2022-12-31 NOTE — Progress Notes (Signed)
Established Patient Office Visit  Subjective:   Patient ID: Carl Moore, male    DOB: 10/12/1980  Age: 42 y.o. MRN: [id number]  CC:  Chief Complaint  Patient presents with   Ear Fullness    Ears are feeling clogged.    HPI: Carl Moore is a 42 y.o. male presenting on 12/31/2022 for Ear Fullness (Ears are feeling clogged.)  Ear Fullness     Bil ear feeling full and stopped up. He often every few years gets them cleaned out. No pain. No nasal congestion and or sore throat.   Got a little bit earwax out yesterday but not all.          ROS: Negative unless specifically indicated above in HPI.   Relevant past medical history reviewed and updated as indicated.   Allergies and medications reviewed and updated.   Current Outpatient Medications:    albuterol (VENTOLIN HFA) 108 (90 Base) MCG/ACT inhaler, Inhale 2 puffs into the lungs every 6 (six) hours as needed for wheezing or shortness of breath., Disp: 8 g, Rfl: 0   FLUoxetine (PROZAC) 20 MG capsule, Take 1 capsule (20 mg total) by mouth daily. For anxiety, Disp: 30 capsule, Rfl: 0   fluticasone (FLONASE) 50 MCG/ACT nasal spray, Place 1 spray into both nostrils 2 (two) times daily as needed for allergies or rhinitis., Disp: , Rfl:    ibuprofen (ADVIL) 600 MG tablet, Take 1 tablet (600 mg total) by mouth every 6 (six) hours as needed., Disp: 30 tablet, Rfl: 0   Levocetirizine Dihydrochloride (XYZAL PO), Take 1 tablet by mouth as needed (seasonal allergies)., Disp: , Rfl:    zolpidem (AMBIEN) 5 MG tablet, Take 1 tablet (5 mg total) by mouth at bedtime as needed. for sleep, Disp: 90 tablet, Rfl: 0  Allergies  Allergen Reactions   Zoloft [Sertraline] Rash   Doxycycline Rash    Objective:   BP 130/84 (BP Location: Left Arm)   Pulse 88   Temp 98.8 F (37.1 C) (Temporal)   Ht 5\' 6"  (1.676 m)   Wt 178 lb 3.2 oz (80.8 kg)   SpO2 98%   BMI 28.76 kg/m    Physical Exam Vitals reviewed.  Constitutional:       General: He is not in acute distress.    Appearance: Normal appearance. He is obese. He is not ill-appearing, toxic-appearing or diaphoretic.  HENT:     Head: Normocephalic.     Comments: Post irrigation without any more ear wax  Good cone of light reflex  No erythema     Right Ear: Tympanic membrane normal. There is impacted cerumen.     Left Ear: Tympanic membrane normal. There is impacted cerumen.     Nose: Nose normal.     Mouth/Throat:     Mouth: Mucous membranes are moist.  Eyes:     Pupils: Pupils are equal, round, and reactive to light.  Cardiovascular:     Rate and Rhythm: Normal rate and regular rhythm.  Pulmonary:     Effort: Pulmonary effort is normal.     Breath sounds: Normal breath sounds. No wheezing.  Musculoskeletal:        General: Normal range of motion.     Cervical back: Normal range of motion.  Neurological:     General: No focal deficit present.     Mental Status: He is alert and oriented to person, place, and time. Mental status is at baseline.  Psychiatric:  Mood and Affect: Mood normal.        Behavior: Behavior normal.        Thought Content: Thought content normal.        Judgment: Judgment normal.     Assessment & Plan:  Impacted cerumen of both ears Assessment & Plan: Ceruminosis is noted.  Obtained verbal patient consent prior to procedure, possible risks of procedure discussed with pt prior, and then Wax was removed by syringing/irrigation and manual debridement was performed by me with curette. Instructions for home care to prevent wax buildup are given and handout provided to pt .pt tolerated procedure well.       Follow up plan: Return for f/u PCP if no improvement in symptoms.  Mort Sawyers, FNP

## 2022-12-31 NOTE — Assessment & Plan Note (Addendum)
Ceruminosis is noted.  Obtained verbal patient consent prior to procedure, possible risks of procedure discussed with pt prior, and then Wax was removed by syringing/irrigation and manual debridement was performed by me with curette. Instructions for home care to prevent wax buildup are given and handout provided to pt .pt tolerated procedure well.   Did d/w pt daily use of hydrogen peroxide mixed with half cap water an to avoid use of Q tips

## 2023-01-02 ENCOUNTER — Ambulatory Visit (INDEPENDENT_AMBULATORY_CARE_PROVIDER_SITE_OTHER): Payer: Medicaid Other

## 2023-01-02 ENCOUNTER — Ambulatory Visit (INDEPENDENT_AMBULATORY_CARE_PROVIDER_SITE_OTHER): Payer: Medicaid Other | Admitting: Sports Medicine

## 2023-01-02 VITALS — BP 132/84 | HR 109 | Ht 66.0 in | Wt 177.0 lb

## 2023-01-02 DIAGNOSIS — M25512 Pain in left shoulder: Secondary | ICD-10-CM | POA: Diagnosis not present

## 2023-01-02 DIAGNOSIS — M75102 Unspecified rotator cuff tear or rupture of left shoulder, not specified as traumatic: Secondary | ICD-10-CM | POA: Diagnosis not present

## 2023-01-02 DIAGNOSIS — G8929 Other chronic pain: Secondary | ICD-10-CM

## 2023-01-02 MED ORDER — MELOXICAM 15 MG PO TABS: 15.0000 mg | ORAL_TABLET | Freq: Every day | ORAL | 0 refills | Status: DC

## 2023-01-02 NOTE — Patient Instructions (Signed)
-   Start meloxicam 15 mg daily x2 weeks.  If still having pain after 2 weeks, complete 3rd-week of meloxicam. May use remaining meloxicam as needed once daily for pain control.  Do not to use additional NSAIDs while taking meloxicam.  May use Tylenol [phone] mg 2 to 3 times a day for breakthrough pain. Mri left shoulder  Follow up 4 days after to discuss results
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
## 2023-01-20 ENCOUNTER — Ambulatory Visit (INDEPENDENT_AMBULATORY_CARE_PROVIDER_SITE_OTHER): Payer: Medicaid Other

## 2023-01-20 ENCOUNTER — Ambulatory Visit (INDEPENDENT_AMBULATORY_CARE_PROVIDER_SITE_OTHER): Payer: Medicaid Other | Admitting: Sports Medicine

## 2023-01-20 DIAGNOSIS — G8929 Other chronic pain: Secondary | ICD-10-CM

## 2023-01-20 DIAGNOSIS — M25512 Pain in left shoulder: Secondary | ICD-10-CM | POA: Diagnosis not present

## 2023-01-20 NOTE — Progress Notes (Signed)
    Procedures performed today:    Procedure: Real-time Ultrasound Guided gadolinium contrast injection of left glenohumeral joint Device: Samsung HS60  Verbal informed consent obtained.  Time-out conducted.  Noted no overlying erythema, induration, or other signs of local infection.  Skin prepped in a sterile fashion.  Local anesthesia: Topical Ethyl chloride.  With sterile technique and under real time ultrasound guidance: Noted trace effusion, 22-gauge spinal needle advanced into the joint from a posterior approach, 1 cc kenalog 40, 2 cc lidocaine, 2 cc bupivacaine injected, syringe switched and 0.1 cc gadolinium injected, syringe again switched and 10 cc sterile saline used to fully send joint. Joint visualized and capsule seen distending confirming intra-articular placement of contrast material and medication. Completed without difficulty  Advised to call if fevers/chills, erythema, induration, drainage, or persistent bleeding.  Images permanently stored in PACS Impression: Technically successful ultrasound guided gadolinium contrast injection for MR arthrography.  Please see separate MR arthrogram report.  Independent interpretation of notes and tests performed by another provider:   None.  Brief History, Exam, Impression, and Recommendations:    Chronic left shoulder pain Chronic left shoulder pain currently managed by Dr. Jean Rosenthal, he was referred to me for MR arthrography, injection was performed today, further management per primary treating provider.    ____________________________________________ Carl Moore. Benjamin Stain, M.D., ABFM., CAQSM., AME. Primary Care and Sports Medicine [facility]  Adjunct Professor of Family Medicine  [employer]  Restaurant manager, fast food

## 2023-01-20 NOTE — Assessment & Plan Note (Signed)
Chronic left shoulder pain currently managed by Dr. Jean Rosenthal, he was referred to me for MR arthrography, injection was performed today, further management per primary treating provider.
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
## 2023-01-28 ENCOUNTER — Ambulatory Visit (INDEPENDENT_AMBULATORY_CARE_PROVIDER_SITE_OTHER): Payer: Medicaid Other | Admitting: Sports Medicine

## 2023-01-28 VITALS — HR 81 | Ht 66.0 in | Wt 173.0 lb

## 2023-01-28 DIAGNOSIS — M19112 Post-traumatic osteoarthritis, left shoulder: Secondary | ICD-10-CM | POA: Diagnosis not present

## 2023-01-28 DIAGNOSIS — G8929 Other chronic pain: Secondary | ICD-10-CM | POA: Diagnosis not present

## 2023-01-28 DIAGNOSIS — M25512 Pain in left shoulder: Secondary | ICD-10-CM | POA: Diagnosis not present

## 2023-01-28 DIAGNOSIS — M2400 Loose body in unspecified joint: Secondary | ICD-10-CM | POA: Diagnosis not present

## 2023-01-28 MED ORDER — CYCLOBENZAPRINE HCL 5 MG PO TABS: 5.0000 mg | ORAL_TABLET | Freq: Three times a day (TID) | ORAL | 0 refills | Status: DC | PRN

## 2023-01-28 NOTE — Patient Instructions (Signed)
Flexeril 5-10 mg nightly as needed for muscle spasm  PT referral  3 week follow up

## 2023-02-03 ENCOUNTER — Telehealth: Payer: Self-pay | Admitting: Sports Medicine

## 2023-02-03 ENCOUNTER — Other Ambulatory Visit: Payer: Self-pay | Admitting: Sports Medicine

## 2023-02-03 DIAGNOSIS — M2400 Loose body in unspecified joint: Secondary | ICD-10-CM

## 2023-02-03 DIAGNOSIS — G8929 Other chronic pain: Secondary | ICD-10-CM

## 2023-02-03 DIAGNOSIS — M75102 Unspecified rotator cuff tear or rupture of left shoulder, not specified as traumatic: Secondary | ICD-10-CM

## 2023-02-03 DIAGNOSIS — M19112 Post-traumatic osteoarthritis, left shoulder: Secondary | ICD-10-CM

## 2023-02-03 NOTE — Telephone Encounter (Signed)
Referral placed.

## 2023-02-03 NOTE — Telephone Encounter (Signed)
Patient called stating that his left shoulder has still been giving him a hard time and asked if we would be able to go ahead and refer him to a surgeon.  Please advise.

## 2023-02-03 NOTE — Progress Notes (Unsigned)
Referral placed.

## 2023-02-07 ENCOUNTER — Ambulatory Visit (INDEPENDENT_AMBULATORY_CARE_PROVIDER_SITE_OTHER): Payer: Medicaid Other | Admitting: Orthopedic Surgery

## 2023-02-07 ENCOUNTER — Encounter: Payer: Self-pay | Admitting: Orthopedic Surgery

## 2023-02-07 DIAGNOSIS — M25512 Pain in left shoulder: Secondary | ICD-10-CM | POA: Diagnosis not present

## 2023-02-07 MED ORDER — TRAMADOL HCL 50 MG PO TABS: 50.0000 mg | ORAL_TABLET | Freq: Two times a day (BID) | ORAL | 0 refills | Status: DC | PRN

## 2023-02-07 MED ORDER — GABAPENTIN 100 MG PO CAPS: 100.0000 mg | ORAL_CAPSULE | Freq: Three times a day (TID) | ORAL | 0 refills | Status: DC
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
## 2023-02-14 ENCOUNTER — Other Ambulatory Visit: Payer: Medicaid Other

## 2023-02-17 ENCOUNTER — Other Ambulatory Visit: Payer: Medicaid Other
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
## 2023-02-18 ENCOUNTER — Ambulatory Visit: Payer: Medicaid Other | Admitting: Sports Medicine

## 2023-02-19 ENCOUNTER — Other Ambulatory Visit: Payer: Medicaid Other

## 2023-02-19 ENCOUNTER — Other Ambulatory Visit: Payer: Self-pay | Admitting: Orthopedic Surgery

## 2023-02-26 ENCOUNTER — Other Ambulatory Visit: Payer: Medicaid Other

## 2023-02-27 ENCOUNTER — Ambulatory Visit
Admission: RE | Admit: 2023-02-27 | Discharge: 2023-02-27 | Disposition: A | Discharge status: Home / Self Care | Payer: Medicaid Other | Source: Ambulatory Visit | Attending: Orthopedic Surgery | Admitting: Orthopedic Surgery

## 2023-02-27 DIAGNOSIS — M25512 Pain in left shoulder: Secondary | ICD-10-CM

## 2023-03-05 ENCOUNTER — Telehealth: Payer: Self-pay | Admitting: Primary Care

## 2023-03-05 NOTE — Telephone Encounter (Signed)
Patient contacted the office, states he tested positive for covid today. Asked if there was anything Jae Dire could possibly send in for him to take to help with symptoms. Patient can be reached at mobile number.

## 2023-03-06 ENCOUNTER — Encounter: Payer: Self-pay | Admitting: Family Medicine

## 2023-03-06 ENCOUNTER — Telehealth (INDEPENDENT_AMBULATORY_CARE_PROVIDER_SITE_OTHER): Payer: 59 | Admitting: Family Medicine

## 2023-03-06 VITALS — Temp 98.7°F | Ht 66.0 in | Wt 163.0 lb

## 2023-03-06 DIAGNOSIS — U071 COVID-19: Secondary | ICD-10-CM

## 2023-03-06 MED ORDER — GUAIFENESIN-CODEINE 100-10 MG/5ML PO SYRP: 5.0000 mL | ORAL_SOLUTION | Freq: Every evening | ORAL | 0 refills | Status: DC | PRN

## 2023-03-06 NOTE — Telephone Encounter (Signed)
Called and spoke with patient he states his symptoms began yesterday with sore throat, body aches, fever, shortness of breath. At home covid test yesterday was pos. Scheduled patient a virtual appt with Dr. Ermalene Searing at 10:40.

## 2023-03-06 NOTE — Assessment & Plan Note (Signed)
COVID19  infectionNo clear sign of bacterial infection at this time.   No SOB.  No red flags/need for ER visit or in-person exam at respiratory clinic at this time..    Pt low risk for COVID complications. No indication for oral antiviral. Recommend good rest, fluids and time.  Will treat cough at bedtime with codeine cough suppressant.  Mucinex DM during the day.  Tylenol as needed for pain or fever. If SOB begins symptoms worsening.. have low threshold for in-person exam, if severe shortness of breath ER visit recommended.  Can monitor Oxygen saturation at home with home monitor if able to obtain.  Go to ER if O2 sat < 90% on room air.  Reviewed home care and provided information through MyChart.   5 days isolation recommended. Return to work day 6 and wear mask to complete 10 days. Provided info about prevention of spread of COVID 19.

## 2023-03-06 NOTE — Progress Notes (Signed)
VIRTUAL VISIT A virtual visit is felt to be most appropriate for this patient at this time.   I connected with the patient on 03/06/23 at 10:40 AM EDT by virtual telehealth platform and verified that I am speaking with the correct person using two identifiers.   I discussed the limitations, risks, security and privacy concerns of performing an evaluation and management service by  virtual telehealth platform and the availability of in person appointments. I also discussed with the patient that there may be a patient responsible charge related to this service. The patient expressed understanding and agreed to proceed.  Patient location: Home Provider Location: [facility] Participants: Kerby Nora and Jennette Bill   Chief Complaint  Patient presents with   Covid Positive    C/o HA, ST, runny nose and body aches. Sxs started 03/05/23. Pos home Covid test- 03/05/23.    History of Present Illness:  42 y.o. male patient of Doreene Nest, NP presents with  COVID  Date of onset:  day 3 of illness Initial symptoms included  fatigue, HA, nasal congestion Symptoms progressed to fever, chest congestion and pain.  Cough, productive... keeping him up at night.  Some mild SOB with exertion, none at rest.  Sneeze.    Sick contacts:  deals with public and fellow employees COVID testing:  yesterday positive       He has tried to treat with  tylenol.    No history of chronic lung disease such as asthma or COPD. Non-smoker.   COVID 19 screen No recent travel or known exposure to COVID19 The patient denies respiratory symptoms of COVID 19 at this time.  The importance of social distancing was discussed today.   Review of Systems  Constitutional:  Negative for chills and fever.  HENT:  Positive for congestion. Negative for ear pain.   Eyes:  Negative for pain and redness.  Respiratory:  Negative for cough and shortness of breath.   Cardiovascular:  Negative for chest pain,  palpitations and leg swelling.  Gastrointestinal:  Negative for abdominal pain, blood in stool, constipation, diarrhea, nausea and vomiting.  Genitourinary:  Negative for dysuria.  Musculoskeletal:  Negative for falls and myalgias.  Skin:  Negative for rash.  Neurological:  Negative for dizziness.  Psychiatric/Behavioral:  Negative for depression. The patient is not nervous/anxious.       Past Medical History:  Diagnosis Date   Anxiety    Cerumen impaction 01/24/2020   Chicken pox    Continuous RUQ abdominal pain 01/24/2020   COVID-19 virus infection 03/16/2021   Diverticulosis    Fatty liver    History of kidney stones    Symptomatic cholelithiasis 03/06/2020   On contrasted CT abd/pelvis 12/2019    reports that he has never smoked. He has never used smokeless tobacco. He reports that he does not drink alcohol and does not use drugs.   Current Outpatient Medications:    albuterol (VENTOLIN HFA) 108 (90 Base) MCG/ACT inhaler, Inhale 2 puffs into the lungs every 6 (six) hours as needed for wheezing or shortness of breath., Disp: 8 g, Rfl: 0   cyclobenzaprine (FLEXERIL) 5 MG tablet, Take 1 tablet (5 mg total) by mouth 3 (three) times daily as needed for muscle spasms., Disp: 30 tablet, Rfl: 0   FLUoxetine (PROZAC) 20 MG capsule, Take 1 capsule (20 mg total) by mouth daily. For anxiety, Disp: 30 capsule, Rfl: 0   fluticasone (FLONASE) 50 MCG/ACT nasal spray, Place 1 spray into both  nostrils 2 (two) times daily as needed for allergies or rhinitis., Disp: , Rfl:    gabapentin (NEURONTIN) 100 MG capsule, Take 1 capsule (100 mg total) by mouth 3 (three) times daily., Disp: 90 capsule, Rfl: 0   guaiFENesin-codeine (ROBITUSSIN AC) 100-10 MG/5ML syrup, Take 5-10 mLs by mouth at bedtime as needed for cough., Disp: 180 mL, Rfl: 0   ibuprofen (ADVIL) 600 MG tablet, Take 1 tablet (600 mg total) by mouth every 6 (six) hours as needed., Disp: 30 tablet, Rfl: 0   Levocetirizine Dihydrochloride (XYZAL  PO), Take 1 tablet by mouth as needed (seasonal allergies)., Disp: , Rfl:    meloxicam (MOBIC) 15 MG tablet, Take 1 tablet (15 mg total) by mouth daily., Disp: 30 tablet, Rfl: 0   traMADol (ULTRAM) 50 MG tablet, Take 1 tablet (50 mg total) by mouth daily as needed., Disp: 20 tablet, Rfl: 0   zolpidem (AMBIEN) 5 MG tablet, Take 1 tablet (5 mg total) by mouth at bedtime as needed. for sleep, Disp: 90 tablet, Rfl: 0   Observations/Objective: Temperature 98.7 F (37.1 C), height 5\' 6"  (1.676 m), weight 163 lb (73.9 kg).  Physical Exam Constitutional:      General: The patient is not in acute distress. Pulmonary:     Effort: Pulmonary effort is normal. No respiratory distress.  Neurological:     Mental Status: The patient is alert and oriented to person, place, and time.  Psychiatric:        Mood and Affect: Mood normal.        Behavior: Behavior normal.    Assessment and Plan COVID-19 Assessment & Plan:  COVID19  infectionNo clear sign of bacterial infection at this time.   No SOB.  No red flags/need for ER visit or in-person exam at respiratory clinic at this time..    Pt low risk for COVID complications. No indication for oral antiviral. Recommend good rest, fluids and time.  Will treat cough at bedtime with codeine cough suppressant.  Mucinex DM during the day.  Tylenol as needed for pain or fever. If SOB begins symptoms worsening.. have low threshold for in-person exam, if severe shortness of breath ER visit recommended.  Can monitor Oxygen saturation at home with home monitor if able to obtain.  Go to ER if O2 sat < 90% on room air.  Reviewed home care and provided information through MyChart.   5 days isolation recommended. Return to work day 6 and wear mask to complete 10 days. Provided info about prevention of spread of COVID 19.    Other orders -     guaiFENesin-Codeine; Take 5-10 mLs by mouth at bedtime as needed for cough.  Dispense: 180 mL; Refill: 0      I  discussed the assessment and treatment plan with the patient. The patient was provided an opportunity to ask questions and all were answered. The patient agreed with the plan and demonstrated an understanding of the instructions.   The patient was advised to call back or seek an in-person evaluation if the symptoms worsen or if the condition fails to improve as anticipated.     Kerby Nora, MD

## 2023-03-07 ENCOUNTER — Ambulatory Visit: Payer: Medicaid Other | Admitting: Orthopedic Surgery

## 2023-03-14 ENCOUNTER — Ambulatory Visit: Payer: Medicaid Other | Admitting: Orthopedic Surgery

## 2023-03-25 ENCOUNTER — Other Ambulatory Visit: Payer: Self-pay | Admitting: Sports Medicine

## 2023-03-25 ENCOUNTER — Other Ambulatory Visit: Payer: Self-pay | Admitting: Surgical

## 2023-03-28 ENCOUNTER — Telehealth: Payer: Self-pay | Admitting: Orthopedic Surgery

## 2023-03-28 ENCOUNTER — Ambulatory Visit (INDEPENDENT_AMBULATORY_CARE_PROVIDER_SITE_OTHER): Payer: 59 | Admitting: Orthopedic Surgery

## 2023-03-28 ENCOUNTER — Other Ambulatory Visit: Payer: Self-pay | Admitting: Orthopedic Surgery

## 2023-03-28 DIAGNOSIS — M659 Synovitis and tenosynovitis, unspecified: Secondary | ICD-10-CM

## 2023-03-28 DIAGNOSIS — M24012 Loose body in left shoulder: Secondary | ICD-10-CM

## 2023-03-28 MED ORDER — TRAMADOL HCL 50 MG PO TABS: 50.0000 mg | ORAL_TABLET | Freq: Three times a day (TID) | ORAL | 0 refills | Status: DC | PRN

## 2023-03-28 NOTE — Telephone Encounter (Signed)
Before pt left his apt today he said that he only had two days left of tramadol and asking for a refill while he waits to be scheduled for surgery.

## 2023-03-28 NOTE — Telephone Encounter (Signed)
Sent in pls calal thx

## 2023-03-29 ENCOUNTER — Encounter: Payer: Self-pay | Admitting: Orthopedic Surgery
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
## 2023-04-03 ENCOUNTER — Other Ambulatory Visit: Payer: Self-pay | Admitting: Sports Medicine

## 2023-04-03 ENCOUNTER — Ambulatory Visit: Payer: Medicaid Other | Admitting: Orthopedic Surgery

## 2023-04-14 ENCOUNTER — Other Ambulatory Visit: Payer: Self-pay | Admitting: Sports Medicine

## 2023-04-16 NOTE — Progress Notes (Signed)
Surgical Instructions    Your procedure is scheduled on Thursday, 04/24/23.  Report to [facility] Main Entrance "A" at 1:10 P.M., then check in with the Admitting office.  Call this number if you have problems the morning of surgery:  [phone]   If you have any questions prior to your surgery date call [phone]: Open Monday-Friday 8am-4pm If you experience any cold or flu symptoms such as cough, fever, chills, shortness of breath, etc. between now and your scheduled surgery, please notify us at the above number     Remember:  Do not eat after midnight the night before your surgery  You may drink clear liquids until 12:10PM the morning of your surgery.   Clear liquids allowed are: Water, Non-Citrus Juices (without pulp), Carbonated Beverages, Clear Tea, Black Coffee ONLY (NO MILK, CREAM OR POWDERED CREAMER of any kind), and Gatorade    Take these medicines the morning of surgery with A SIP OF WATER:  FLUoxetine (PROZAC)   If needed: cyclobenzaprine (FLEXERIL)  traMADol (ULTRAM)   As of today, STOP taking any Aspirin (unless otherwise instructed by your surgeon) Aleve, Naproxen, Ibuprofen, Motrin, Advil, Goody's, BC's, all herbal medications, fish oil, and all vitamins.           Do not wear jewelry or makeup. Do not wear lotions, powders, cologne or deodorant. Men may shave face and neck. Do not bring valuables to the hospital. Do not wear nail polish, gel polish, artificial nails, or any other type of covering on natural nails (fingers and toes) If you have artificial nails or gel coating that need to be removed by a nail salon, please have this removed prior to surgery. Artificial nails or gel coating may interfere with anesthesia's ability to adequately monitor your vital signs.  [facility] is not responsible for any belongings or valuables.    Do NOT Smoke (Tobacco/Vaping)  24 hours prior to your procedure  If you use a CPAP at night, you may bring your mask for your  overnight stay.   Contacts, glasses, hearing aids, dentures or partials may not be worn into surgery, please bring cases for these belongings   For patients admitted to the hospital, discharge time will be determined by your treatment team.   Patients discharged the day of surgery will not be allowed to drive home, and someone needs to stay with them for 24 hours.   SURGICAL WAITING ROOM VISITATION Patients having surgery or a procedure may have no more than 2 support people in the waiting area - these visitors may rotate.   Children under the age of 34 must have an adult with them who is not the patient. If the patient needs to stay at the hospital during part of their recovery, the visitor guidelines for inpatient rooms apply. Pre-op nurse will coordinate an appropriate time for 1 support person to accompany patient in pre-op.  This support person may not rotate.   Please refer to [email or web address] for the visitor guidelines for Inpatients (after your surgery is over and you are in a regular room).    Special instructions:    Oral Hygiene is also important to reduce your risk of infection.  Remember - BRUSH YOUR TEETH THE MORNING OF SURGERY WITH YOUR REGULAR TOOTHPASTE   [facility]- Preparing For Surgery  Before surgery, you can play an important role. Because skin is not sterile, your skin needs to be as free of germs as possible. You can reduce the number of germs on your  skin by washing with CHG (chlorahexidine gluconate) Soap before surgery.  CHG is an antiseptic cleaner which kills germs and bonds with the skin to continue killing germs even after washing.     Please do not use if you have an allergy to CHG or antibacterial soaps. If your skin becomes reddened/irritated stop using the CHG.  Do not shave (including legs and underarms) for at least 48 hours prior to first CHG shower. It is OK to shave your face.  Please follow  these instructions carefully.     Shower the NIGHT BEFORE SURGERY and the MORNING OF SURGERY with CHG Soap.   If you chose to wash your hair, wash your hair first as usual with your normal shampoo. After you shampoo, rinse your hair and body thoroughly to remove the shampoo.  Then [employer] and genitals (private parts) with your normal soap and rinse thoroughly to remove soap.  After that Use CHG Soap as you would any other liquid soap. You can apply CHG directly to the skin and wash gently with a scrungie or a clean washcloth.   Apply the CHG Soap to your body ONLY FROM THE NECK DOWN.  Do not use on open wounds or open sores. Avoid contact with your eyes, ears, mouth and genitals (private parts). Wash Face and genitals (private parts)  with your normal soap.   Wash thoroughly, paying special attention to the area where your surgery will be performed.  Thoroughly rinse your body with warm water from the neck down.  DO NOT shower/wash with your normal soap after using and rinsing off the CHG Soap.  Pat yourself dry with a CLEAN TOWEL.  Wear CLEAN PAJAMAS to bed the night before surgery  Place CLEAN SHEETS on your bed the night before your surgery  DO NOT SLEEP WITH PETS.   Day of Surgery: Take a shower with CHG soap. Wear Clean/Comfortable clothing the morning of surgery Do not apply any deodorants/lotions.   Remember to brush your teeth WITH YOUR REGULAR TOOTHPASTE.    If you received a COVID test during your pre-op visit, it is requested that you wear a mask when out in public, stay away from anyone that may not be feeling well, and notify your surgeon if you develop symptoms. If you have been in contact with anyone that has tested positive in the last 10 days, please notify your surgeon.    Please read over the following fact sheets that you were given.

## 2023-04-17 ENCOUNTER — Encounter (HOSPITAL_COMMUNITY)
Admission: RE | Admit: 2023-04-17 | Discharge: 2023-04-17 | Disposition: A | Discharge status: Home / Self Care | Payer: 59 | Source: Ambulatory Visit | Attending: Orthopedic Surgery | Admitting: Orthopedic Surgery

## 2023-04-17 ENCOUNTER — Encounter (HOSPITAL_COMMUNITY): Payer: Self-pay

## 2023-04-17 ENCOUNTER — Other Ambulatory Visit: Payer: Self-pay

## 2023-04-17 VITALS — BP 144/90 | HR 79 | Temp 98.5°F | Resp 18 | Ht 66.0 in | Wt 170.3 lb

## 2023-04-17 DIAGNOSIS — Z01812 Encounter for preprocedural laboratory examination: Secondary | ICD-10-CM | POA: Insufficient documentation

## 2023-04-17 DIAGNOSIS — Z01818 Encounter for other preprocedural examination: Secondary | ICD-10-CM

## 2023-04-17 LAB — CBC
HCT: 44.6 % (ref 39.0–52.0)
Hemoglobin: 14.8 g/dL (ref 13.0–17.0)
MCH: 28.9 pg (ref 26.0–34.0)
MCHC: 33.2 g/dL (ref 30.0–36.0)
MCV: 87.1 fL (ref 80.0–100.0)
Platelets: 349 10*3/uL (ref 150–400)
RBC: 5.12 MIL/uL (ref 4.22–5.81)
RDW: 13.4 % (ref 11.5–15.5)
WBC: 8 10*3/uL (ref 4.0–10.5)
nRBC: 0 % (ref 0.0–0.2)

## 2023-04-17 NOTE — Progress Notes (Signed)
PCP - Vernona Rieger, NP Cardiologist - denies  PPM/ICD - denies   Chest x-ray - n/a EKG - n/a Stress Test - denies ECHO - denies Cardiac Cath - denies  Sleep Study - denies  ERAS Protcol -yes PRE-SURGERY Ensure or G2- none ordered  COVID TEST- not needed   Anesthesia review: no  Patient denies shortness of breath, fever, cough and chest pain at PAT appointment   All instructions explained to the patient, with a verbal understanding of the material. Patient agrees to go over the instructions while at home for a better understanding. Patient also instructed to self quarantine after being tested for COVID-19. The opportunity to ask questions was provided.

## 2023-04-23 NOTE — Progress Notes (Signed)
Spoke with the pt, he will arrive tom at 0900. NPO post midnight and will stop clear liquids at 0800 tom.

## 2023-04-24 ENCOUNTER — Ambulatory Visit (HOSPITAL_COMMUNITY)
Admission: RE | Admit: 2023-04-24 | Discharge: 2023-04-24 | Disposition: A | Discharge status: Home / Self Care | Payer: 59 | Attending: Orthopedic Surgery | Admitting: Orthopedic Surgery

## 2023-04-24 ENCOUNTER — Other Ambulatory Visit: Payer: Self-pay

## 2023-04-24 ENCOUNTER — Encounter (HOSPITAL_COMMUNITY): Payer: Self-pay | Admitting: Orthopedic Surgery

## 2023-04-24 ENCOUNTER — Encounter (HOSPITAL_COMMUNITY)
Admission: RE | Disposition: A | Discharge status: Home / Self Care | Payer: Self-pay | Source: Home / Self Care | Attending: Orthopedic Surgery

## 2023-04-24 ENCOUNTER — Other Ambulatory Visit (HOSPITAL_COMMUNITY): Payer: Self-pay

## 2023-04-24 ENCOUNTER — Ambulatory Visit (HOSPITAL_COMMUNITY): Payer: 59 | Admitting: Certified Registered Nurse Anesthetist

## 2023-04-24 ENCOUNTER — Ambulatory Visit (HOSPITAL_BASED_OUTPATIENT_CLINIC_OR_DEPARTMENT_OTHER): Payer: 59 | Admitting: Certified Registered Nurse Anesthetist

## 2023-04-24 DIAGNOSIS — X58XXXA Exposure to other specified factors, initial encounter: Secondary | ICD-10-CM | POA: Diagnosis not present

## 2023-04-24 DIAGNOSIS — F419 Anxiety disorder, unspecified: Secondary | ICD-10-CM | POA: Insufficient documentation

## 2023-04-24 DIAGNOSIS — S43432D Superior glenoid labrum lesion of left shoulder, subsequent encounter: Secondary | ICD-10-CM

## 2023-04-24 DIAGNOSIS — M659 Synovitis and tenosynovitis, unspecified: Secondary | ICD-10-CM | POA: Diagnosis present

## 2023-04-24 DIAGNOSIS — M19012 Primary osteoarthritis, left shoulder: Secondary | ICD-10-CM | POA: Diagnosis not present

## 2023-04-24 DIAGNOSIS — M7522 Bicipital tendinitis, left shoulder: Secondary | ICD-10-CM | POA: Diagnosis not present

## 2023-04-24 DIAGNOSIS — F32A Depression, unspecified: Secondary | ICD-10-CM | POA: Diagnosis not present

## 2023-04-24 DIAGNOSIS — M65912 Unspecified synovitis and tenosynovitis, left shoulder: Secondary | ICD-10-CM

## 2023-04-24 DIAGNOSIS — S43432A Superior glenoid labrum lesion of left shoulder, initial encounter: Secondary | ICD-10-CM

## 2023-04-24 DIAGNOSIS — M65812 Other synovitis and tenosynovitis, left shoulder: Secondary | ICD-10-CM | POA: Diagnosis not present

## 2023-04-24 DIAGNOSIS — Z01818 Encounter for other preprocedural examination: Secondary | ICD-10-CM

## 2023-04-24 HISTORY — PX: SHOULDER ARTHROSCOPY: SHX128

## 2023-04-24 HISTORY — DX: Superior glenoid labrum lesion of left shoulder, initial encounter: S43.432A

## 2023-04-24 HISTORY — DX: Unspecified synovitis and tenosynovitis, left shoulder: M65.912

## 2023-04-24 LAB — BASIC METABOLIC PANEL
Anion gap: 9 (ref 5–15)
BUN: 24 mg/dL — ABNORMAL HIGH (ref 6–20)
CO2: 22 mmol/L (ref 22–32)
Calcium: 8.6 mg/dL — ABNORMAL LOW (ref 8.9–10.3)
Chloride: 102 mmol/L (ref 98–111)
Creatinine, Ser: 1.16 mg/dL (ref 0.61–1.24)
GFR, Estimated: >60 mL/min (ref >60)
Glucose, Bld: 87 mg/dL (ref 70–99)
Potassium: 3.8 mmol/L (ref 3.5–5.1)
Sodium: 133 mmol/L — ABNORMAL LOW (ref 135–145)

## 2023-04-24 SURGERY — ARTHROSCOPY, SHOULDER
Anesthesia: Regional | Site: Shoulder | Laterality: Left

## 2023-04-24 MED ORDER — PHENYLEPHRINE HCL-NACL 20-0.9 MG/250ML-% IV SOLN
INTRAVENOUS | Status: DC | PRN
Start: 2023-04-24 — End: 2023-04-24
  Administered 2023-04-24: 25 ug/min via INTRAVENOUS
  Administered 2023-04-24: 40 ug/min via INTRAVENOUS

## 2023-04-24 MED ORDER — ONDANSETRON HCL 4 MG/2ML IJ SOLN
INTRAMUSCULAR | Status: AC
  Filled 2023-04-24: qty 2

## 2023-04-24 MED ORDER — ACETAMINOPHEN 500 MG PO TABS
1000.0000 mg | ORAL_TABLET | Freq: Once | ORAL | Status: AC
  Administered 2023-04-24: 1000 mg via ORAL
  Filled 2023-04-24: qty 2

## 2023-04-24 MED ORDER — MIDAZOLAM HCL 2 MG/2ML IJ SOLN
INTRAMUSCULAR | Status: AC
  Administered 2023-04-24: 2 mg via INTRAVENOUS
  Filled 2023-04-24: qty 2

## 2023-04-24 MED ORDER — ORAL CARE MOUTH RINSE: 15.0000 mL | Freq: Once | OROMUCOSAL | Status: AC

## 2023-04-24 MED ORDER — BUPIVACAINE LIPOSOME 1.3 % IJ SUSP
INTRAMUSCULAR | Status: DC | PRN
  Administered 2023-04-24: 10 mL via PERINEURAL

## 2023-04-24 MED ORDER — OXYCODONE HCL 5 MG PO TABS
5.0000 mg | ORAL_TABLET | ORAL | 0 refills | Status: DC | PRN
Start: 2023-04-24 — End: 2023-05-20
  Filled 2023-04-24: qty 30, 5d supply, fill #0

## 2023-04-24 MED ORDER — SODIUM CHLORIDE 0.9 % IR SOLN
Status: DC | PRN
  Administered 2023-04-24: 3000 mL

## 2023-04-24 MED ORDER — OXYCODONE HCL 5 MG PO TABS
5.0000 mg | ORAL_TABLET | ORAL | 0 refills | Status: DC | PRN
Start: 2023-04-24 — End: 2023-04-24

## 2023-04-24 MED ORDER — EPHEDRINE SULFATE-NACL 50-0.9 MG/10ML-% IV SOSY
PREFILLED_SYRINGE | INTRAVENOUS | Status: DC | PRN
  Administered 2023-04-24 (×3): 5 mg via INTRAVENOUS

## 2023-04-24 MED ORDER — CELECOXIB 100 MG PO CAPS: 100.0000 mg | ORAL_CAPSULE | Freq: Two times a day (BID) | ORAL | 0 refills | Status: DC

## 2023-04-24 MED ORDER — ROPIVACAINE HCL 5 MG/ML IJ SOLN
INTRAMUSCULAR | Status: DC | PRN
Start: 2023-04-24 — End: 2023-04-24
  Administered 2023-04-24: 20 mL via PERINEURAL

## 2023-04-24 MED ORDER — SUGAMMADEX SODIUM 200 MG/2ML IV SOLN
INTRAVENOUS | Status: DC | PRN
  Administered 2023-04-24: 150 mg via INTRAVENOUS

## 2023-04-24 MED ORDER — FENTANYL CITRATE (PF) 100 MCG/2ML IJ SOLN: 100.0000 ug | Freq: Once | INTRAMUSCULAR | Status: AC

## 2023-04-24 MED ORDER — CEFAZOLIN SODIUM-DEXTROSE 2-4 GM/100ML-% IV SOLN
2.0000 g | INTRAVENOUS | Status: AC
  Administered 2023-04-24: 2 g via INTRAVENOUS
  Filled 2023-04-24: qty 100

## 2023-04-24 MED ORDER — CHLORHEXIDINE GLUCONATE 0.12 % MT SOLN
15.0000 mL | Freq: Once | OROMUCOSAL | Status: AC
  Administered 2023-04-24: 15 mL via OROMUCOSAL
  Filled 2023-04-24: qty 15

## 2023-04-24 MED ORDER — FENTANYL CITRATE (PF) 100 MCG/2ML IJ SOLN
INTRAMUSCULAR | Status: AC
  Administered 2023-04-24: 100 ug via INTRAVENOUS
  Filled 2023-04-24: qty 2

## 2023-04-24 MED ORDER — ONDANSETRON HCL 4 MG/2ML IJ SOLN
INTRAMUSCULAR | Status: DC | PRN
  Administered 2023-04-24: 4 mg via INTRAVENOUS

## 2023-04-24 MED ORDER — PROPOFOL 10 MG/ML IV BOLUS
INTRAVENOUS | Status: AC
  Filled 2023-04-24: qty 20

## 2023-04-24 MED ORDER — LACTATED RINGERS IV SOLN: INTRAVENOUS | Status: DC

## 2023-04-24 MED ORDER — ROCURONIUM BROMIDE 10 MG/ML (PF) SYRINGE
PREFILLED_SYRINGE | INTRAVENOUS | Status: DC | PRN
  Administered 2023-04-24: 50 mg via INTRAVENOUS

## 2023-04-24 MED ORDER — TRANEXAMIC ACID-NACL 1000-0.7 MG/100ML-% IV SOLN
1000.0000 mg | INTRAVENOUS | Status: AC
  Administered 2023-04-24: 1000 mg via INTRAVENOUS
  Filled 2023-04-24: qty 100

## 2023-04-24 MED ORDER — PROPOFOL 10 MG/ML IV BOLUS
INTRAVENOUS | Status: DC | PRN
  Administered 2023-04-24: 200 mg via INTRAVENOUS

## 2023-04-24 MED ORDER — ROCURONIUM BROMIDE 10 MG/ML (PF) SYRINGE
PREFILLED_SYRINGE | INTRAVENOUS | Status: AC
  Filled 2023-04-24: qty 10

## 2023-04-24 MED ORDER — LIDOCAINE 2% (20 MG/ML) 5 ML SYRINGE
INTRAMUSCULAR | Status: AC
  Filled 2023-04-24: qty 5

## 2023-04-24 MED ORDER — LIDOCAINE 2% (20 MG/ML) 5 ML SYRINGE
INTRAMUSCULAR | Status: DC | PRN
  Administered 2023-04-24: 40 mg via INTRAVENOUS

## 2023-04-24 MED ORDER — 0.9 % SODIUM CHLORIDE (POUR BTL) OPTIME
TOPICAL | Status: DC | PRN
Start: 2023-04-24 — End: 2023-04-24
  Administered 2023-04-24: 1000 mL

## 2023-04-24 MED ORDER — CELECOXIB 100 MG PO CAPS
100.0000 mg | ORAL_CAPSULE | Freq: Two times a day (BID) | ORAL | 0 refills | Status: DC
  Filled 2023-04-24: qty 60, 30d supply, fill #0

## 2023-04-24 MED ORDER — DEXAMETHASONE SODIUM PHOSPHATE 10 MG/ML IJ SOLN
INTRAMUSCULAR | Status: DC | PRN
  Administered 2023-04-24: 10 mg via INTRAVENOUS

## 2023-04-24 MED ORDER — FENTANYL CITRATE (PF) 250 MCG/5ML IJ SOLN
INTRAMUSCULAR | Status: AC
  Filled 2023-04-24: qty 5

## 2023-04-24 MED ORDER — MIDAZOLAM HCL 2 MG/2ML IJ SOLN
INTRAMUSCULAR | Status: AC
  Filled 2023-04-24: qty 2

## 2023-04-24 MED ORDER — MIDAZOLAM HCL 2 MG/2ML IJ SOLN: 2.0000 mg | Freq: Once | INTRAMUSCULAR | Status: AC

## 2023-04-24 MED ORDER — DEXAMETHASONE SODIUM PHOSPHATE 10 MG/ML IJ SOLN
INTRAMUSCULAR | Status: AC
  Filled 2023-04-24: qty 1

## 2023-04-24 SURGICAL SUPPLY — 64 items
AID PSTN UNV HD RSTRNT DISP (MISCELLANEOUS) ×1
ALCOHOL 70% 16 OZ (MISCELLANEOUS) ×1 IMPLANT
ANCH SUT 2 FBRTK KNTLS 1.8 (Anchor) ×1 IMPLANT
ANCHOR SUT 1.8 FIBERTAK SB KL (Anchor) IMPLANT
BAG COUNTER SPONGE SURGICOUNT (BAG) ×1 IMPLANT
BAG SPNG CNTER NS LX DISP (BAG) ×1
BLADE EXCALIBUR 4.0X13 (MISCELLANEOUS) ×1 IMPLANT
BLADE SURG 11 STRL SS (BLADE) ×1 IMPLANT
CLSR STERI-STRIP ANTIMIC 1/2X4 (GAUZE/BANDAGES/DRESSINGS) IMPLANT
COOLER ICEMAN CLASSIC (MISCELLANEOUS) IMPLANT
DRAPE IMP U-DRAPE 54X76 (DRAPES) ×1 IMPLANT
DRAPE INCISE IOBAN 66X45 STRL (DRAPES) ×1 IMPLANT
DRAPE STERI 35X30 U-POUCH (DRAPES) ×1 IMPLANT
DRAPE U-SHAPE 47X51 STRL (DRAPES) ×2 IMPLANT
DRSG AQUACEL AG ADV 3.5X 4 (GAUZE/BANDAGES/DRESSINGS) ×1 IMPLANT
DRSG TEGADERM 4X4.75 (GAUZE/BANDAGES/DRESSINGS) IMPLANT
DRSG XEROFORM 1X8 (GAUZE/BANDAGES/DRESSINGS) IMPLANT
DURAPREP 26ML APPLICATOR (WOUND CARE) ×1 IMPLANT
DW OUTFLOW CASSETTE/TUBE SET (MISCELLANEOUS) ×1 IMPLANT
ELECT REM PT RETURN 9FT ADLT (ELECTROSURGICAL) ×1
ELECTRODE REM PT RTRN 9FT ADLT (ELECTROSURGICAL) ×1 IMPLANT
GAUZE SPONGE 4X4 12PLY STRL (GAUZE/BANDAGES/DRESSINGS) IMPLANT
GAUZE XEROFORM 1X8 LF (GAUZE/BANDAGES/DRESSINGS) ×1 IMPLANT
GLOVE BIOGEL M 6.5 STRL (GLOVE) ×1 IMPLANT
GLOVE BIOGEL PI IND STRL 6.5 (GLOVE) ×1 IMPLANT
GLOVE BIOGEL PI IND STRL 8 (GLOVE) ×1 IMPLANT
GLOVE ECLIPSE 8.0 STRL XLNG CF (GLOVE) ×1 IMPLANT
GOWN STRL REUS W/ TWL LRG LVL3 (GOWN DISPOSABLE) ×3 IMPLANT
GOWN STRL REUS W/TWL LRG LVL3 (GOWN DISPOSABLE) ×3
KIT BASIN OR (CUSTOM PROCEDURE TRAY) ×1 IMPLANT
KIT STR SPEAR 1.8 FBRTK DISP (KITS) IMPLANT
KIT TURNOVER KIT B (KITS) ×1 IMPLANT
MANIFOLD NEPTUNE II (INSTRUMENTS) ×1 IMPLANT
NDL HYPO 25X1 1.5 SAFETY (NEEDLE) ×1 IMPLANT
NDL SPNL 18GX3.5 QUINCKE PK (NEEDLE) ×1 IMPLANT
NDL TAPERED W/ NITINOL LOOP (MISCELLANEOUS) IMPLANT
NEEDLE HYPO 25X1 1.5 SAFETY (NEEDLE) ×1 IMPLANT
NEEDLE SPNL 18GX3.5 QUINCKE PK (NEEDLE) ×1 IMPLANT
NEEDLE TAPERED W/ NITINOL LOOP (MISCELLANEOUS) ×1 IMPLANT
NS IRRIG 1000ML POUR BTL (IV SOLUTION) ×1 IMPLANT
PACK SHOULDER (CUSTOM PROCEDURE TRAY) ×1 IMPLANT
PAD ARMBOARD 7.5X6 YLW CONV (MISCELLANEOUS) ×2 IMPLANT
PAD COLD SHLDR WRAP-ON (PAD) IMPLANT
PORT APPOLLO RF 90DEGREE MULTI (SURGICAL WAND) ×1 IMPLANT
PROBE APOLLO 90XL (SURGICAL WAND) IMPLANT
RESTRAINT HEAD UNIVERSAL NS (MISCELLANEOUS) ×1 IMPLANT
SLING ARM FOAM STRAP LRG (SOFTGOODS) IMPLANT
SLING ARM IMMOBILIZER XL (CAST SUPPLIES) IMPLANT
SPONGE T-LAP 4X18 ~~LOC~~+RFID (SPONGE) ×1 IMPLANT
SUCTION TUBE FRAZIER 10FR DISP (SUCTIONS) IMPLANT
SUT ETHILON 3 0 PS 1 (SUTURE) ×1 IMPLANT
SUT MNCRL AB 3-0 PS2 27 (SUTURE) IMPLANT
SUT VIC AB 0 CT1 27 (SUTURE) ×1
SUT VIC AB 0 CT1 27XBRD ANBCTR (SUTURE) IMPLANT
SUT VIC AB 1 CT1 27 (SUTURE) ×2
SUT VIC AB 1 CT1 27XBRD ANBCTR (SUTURE) IMPLANT
SUT VIC AB 2-0 CT2 27 (SUTURE) IMPLANT
SUT VICRYL 0 UR6 27IN ABS (SUTURE) IMPLANT
SYS FBRTK BUTTON 2.6 (Anchor) ×1 IMPLANT
SYSTEM FBRTK BUTTON 2.6 (Anchor) IMPLANT
TOWEL GREEN STERILE (TOWEL DISPOSABLE) ×1 IMPLANT
TOWEL GREEN STERILE FF (TOWEL DISPOSABLE) ×1 IMPLANT
TUBING ARTHROSCOPY IRRIG 16FT (MISCELLANEOUS) ×1 IMPLANT
WATER STERILE IRR 1000ML POUR (IV SOLUTION) ×1 IMPLANT

## 2023-04-24 NOTE — Transfer of Care (Signed)
Immediate Anesthesia Transfer of Care Note  Patient: Carl Moore  Procedure(s) Performed: LEFT SHOULDER ARTHROSCOPY, DEBRIDEMENT, LOOSE BODY REMOVAL (Left: Shoulder)  Patient Location: PACU  Anesthesia Type:GA combined with regional for post-op pain  Level of Consciousness: awake, alert , oriented, patient cooperative, and responds to stimulation  Airway & Oxygen Therapy: Patient Spontanous Breathing and Patient connected to face mask oxygen  Post-op Assessment: Report given to RN and Post -op Vital signs reviewed and stable  Post vital signs: Reviewed and stable  Last Vitals:  Vitals Value Taken Time  BP    Temp    Pulse 73 04/24/23 1432  Resp 18 04/24/23 1432  SpO2 100 % 04/24/23 1432  Vitals shown include unfiled device data.  Last Pain:  Vitals:   04/24/23 1145  TempSrc:   PainSc: 7          Complications: No notable events documented.
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
## 2023-04-24 NOTE — Anesthesia Procedure Notes (Signed)
Procedure Name: Intubation Date/Time: 04/24/2023 12:17 PM  Performed by: Garfield Cornea, CRNAPre-anesthesia Checklist: Patient identified, Emergency Drugs available, Suction available and Patient being monitored Patient Re-evaluated:Patient Re-evaluated prior to induction Oxygen Delivery Method: Circle System Utilized Preoxygenation: Pre-oxygenation with 100% oxygen Induction Type: IV induction Ventilation: Mask ventilation without difficulty Laryngoscope Size: Mac and 3 Grade View: Grade II Tube type: Oral Tube size: 7.5 mm Number of attempts: 1 Airway Equipment and Method: Stylet Placement Confirmation: ETT inserted through vocal cords under direct vision, positive ETCO2 and breath sounds checked- equal and bilateral Secured at: 22 cm Tube secured with: Tape Dental Injury: Teeth and Oropharynx as per pre-operative assessment  Comments: Intubation by Whitney Post, paramedic student under direct supervision by MDA and CRNA.

## 2023-04-24 NOTE — Anesthesia Postprocedure Evaluation (Signed)
Anesthesia Post Note  Patient: Jennette Bill  Procedure(s) Performed: LEFT SHOULDER ARTHROSCOPY, DEBRIDEMENT, LOOSE BODY REMOVAL (Left: Shoulder)     Patient location during evaluation: PACU Anesthesia Type: Regional and General Level of consciousness: awake and alert, oriented and patient cooperative Pain management: pain level controlled Vital Signs Assessment: post-procedure vital signs reviewed and stable Respiratory status: spontaneous breathing, nonlabored ventilation and respiratory function stable Cardiovascular status: blood pressure returned to baseline and stable Postop Assessment: no apparent nausea or vomiting Anesthetic complications: no   No notable events documented.  Last Vitals:  Vitals:   04/24/23 1515 04/24/23 1530  BP: (!) 145/87 (!) 148/86  Pulse: 76 80  Resp: 19 19  Temp:    SpO2: 95% 94%    Last Pain:  Vitals:   04/24/23 1500  TempSrc:   PainSc: 0-No pain                 Lannie Fields

## 2023-04-24 NOTE — Brief Op Note (Signed)
   04/24/2023  2:07 PM  PATIENT:  Carl Moore  42 y.o. male  PRE-OPERATIVE DIAGNOSIS:  left shoulder synovitis  POST-OPERATIVE DIAGNOSIS:  left shoulder synovitis, SLAP tear  PROCEDURE:  Procedure(s): LEFT SHOULDER ARTHROSCOPY, DEBRIDEMENT, biceps tenodesis  SURGEON:  Surgeon(s): Cammy Copa, MD  ASSISTANT: magnant pa  ANESTHESIA:   general  EBL: 25 ml    Total I/O In: 1100 [I.V.:1000; IV Piggyback:100] Out: 30 [Blood:30]  BLOOD ADMINISTERED: none  DRAINS: none   LOCAL MEDICATIONS USED:  none  SPECIMEN:  No Specimen  COUNTS:  YES  TOURNIQUET:  * No tourniquets in log *  DICTATION: .Other Dictation: Dictation Number [id number]  PLAN OF CARE: Discharge to home after PACU  PATIENT DISPOSITION:  PACU - hemodynamically stable

## 2023-04-24 NOTE — Anesthesia Procedure Notes (Signed)
Anesthesia Regional Block: Interscalene brachial plexus block   Pre-Anesthetic Checklist: , timeout performed,  Correct Patient, Correct Site, Correct Laterality,  Correct Procedure, Correct Position, site marked,  Risks and benefits discussed,  Surgical consent,  Pre-op evaluation,  At surgeon's request and post-op pain management  Laterality: Left  Prep: Maximum Sterile Barrier Precautions used, chloraprep       Needles:  Injection technique: Single-shot  Needle Type: Echogenic Stimulator Needle     Needle Length: 9cm  Needle Gauge: 22     Additional Needles:   Procedures:,,,, ultrasound used (permanent image in chart),,    Narrative:  Start time: 04/24/2023 11:50 AM End time: 04/24/2023 11:55 AM Injection made incrementally with aspirations every 5 mL.  Performed by: Personally  Anesthesiologist: Lannie Fields, DO  Additional Notes: Monitors applied. No increased pain on injection. No increased resistance to injection. Injection made in 5cc increments. Good needle visualization. Patient tolerated procedure well.

## 2023-04-24 NOTE — Anesthesia Preprocedure Evaluation (Addendum)
Anesthesia Evaluation  Patient identified by MRN, date of birth, ID band Patient awake    Reviewed: Allergy & Precautions, NPO status , Patient's Chart, lab work & pertinent test results  Airway Mallampati: II  TM Distance: >3 FB Neck ROM: Full    Dental  (+) Teeth Intact, Dental Advisory Given   Pulmonary neg pulmonary ROS   Pulmonary exam normal breath sounds clear to auscultation       Cardiovascular negative cardio ROS Normal cardiovascular exam Rhythm:Regular Rate:Normal     Neuro/Psych  PSYCHIATRIC DISORDERS Anxiety Depression    negative neurological ROS     GI/Hepatic negative GI ROS, Neg liver ROS,,,  Endo/Other  negative endocrine ROS    Renal/GU negative Renal ROS  negative genitourinary   Musculoskeletal  (+) Arthritis , Osteoarthritis,    Abdominal   Peds  Hematology negative hematology ROS (+) Hb 14.8, plt 349   Anesthesia Other Findings   Reproductive/Obstetrics negative OB ROS                             Anesthesia Physical Anesthesia Plan  ASA: 2  Anesthesia Plan: General and Regional   Post-op Pain Management: Regional block* and Tylenol PO (pre-op)*   Induction: Intravenous  PONV Risk Score and Plan: 2 and Ondansetron, Dexamethasone, Midazolam and Treatment may vary due to age or medical condition  Airway Management Planned: Oral ETT  Additional Equipment: None  Intra-op Plan:   Post-operative Plan: Extubation in OR  Informed Consent: I have reviewed the patients History and Physical, chart, labs and discussed the procedure including the risks, benefits and alternatives for the proposed anesthesia with the patient or authorized representative who has indicated his/her understanding and acceptance.     Dental advisory given  Plan Discussed with: CRNA  Anesthesia Plan Comments:        Anesthesia Quick Evaluation

## 2023-04-24 NOTE — Addendum Note (Signed)
Addendum  created 04/24/23 1541 by Lannie Fields, DO   Child order released for a procedure order, Clinical Note Signed, Intraprocedure Blocks edited, Intraprocedure Meds edited, SmartForm saved

## 2023-04-25 ENCOUNTER — Encounter (HOSPITAL_COMMUNITY): Payer: Self-pay | Admitting: Orthopedic Surgery

## 2023-05-01 ENCOUNTER — Encounter: Payer: Self-pay | Admitting: Surgical

## 2023-05-01 ENCOUNTER — Ambulatory Visit (INDEPENDENT_AMBULATORY_CARE_PROVIDER_SITE_OTHER): Payer: 59 | Admitting: Surgical

## 2023-05-01 ENCOUNTER — Telehealth: Payer: Self-pay

## 2023-05-01 DIAGNOSIS — M25512 Pain in left shoulder: Secondary | ICD-10-CM
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
## 2023-05-01 NOTE — Telephone Encounter (Signed)
Would recommend stop taking medications he took recently which may be causing reaction. Take benadryl to help with symptoms.  If having any trouble breathing, go to ER

## 2023-05-01 NOTE — Telephone Encounter (Signed)
Patient called stating that he may be having an allergic reaction.  Stated that his eyes are puffy, he's been itching, and his face is braking out.  Patient does have an appt.today at 4:15pm. Patient had left shoulder surgery on 04/24/23.  Cb# [phone].  Please advise.  Thank you.

## 2023-05-08 ENCOUNTER — Ambulatory Visit: Payer: 59 | Attending: Surgical | Admitting: Physical Therapy

## 2023-05-08 ENCOUNTER — Other Ambulatory Visit: Payer: Self-pay

## 2023-05-08 ENCOUNTER — Telehealth: Payer: Self-pay | Admitting: Physical Therapy

## 2023-05-08 DIAGNOSIS — M25512 Pain in left shoulder: Secondary | ICD-10-CM | POA: Insufficient documentation

## 2023-05-08 DIAGNOSIS — M6281 Muscle weakness (generalized): Secondary | ICD-10-CM | POA: Insufficient documentation
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
## 2023-05-13 ENCOUNTER — Ambulatory Visit: Payer: 59

## 2023-05-13 DIAGNOSIS — M25512 Pain in left shoulder: Secondary | ICD-10-CM | POA: Diagnosis not present

## 2023-05-13 DIAGNOSIS — M6281 Muscle weakness (generalized): Secondary | ICD-10-CM
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
## 2023-05-15 ENCOUNTER — Ambulatory Visit: Payer: 59 | Admitting: Physical Therapy

## 2023-05-15 ENCOUNTER — Encounter: Payer: Self-pay | Admitting: Physical Therapy

## 2023-05-15 DIAGNOSIS — M25512 Pain in left shoulder: Secondary | ICD-10-CM | POA: Diagnosis not present

## 2023-05-15 DIAGNOSIS — M6281 Muscle weakness (generalized): Secondary | ICD-10-CM
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
## 2023-05-20 ENCOUNTER — Ambulatory Visit: Payer: 59 | Admitting: Physical Therapy

## 2023-05-20 ENCOUNTER — Encounter: Payer: Self-pay | Admitting: Family Medicine

## 2023-05-20 ENCOUNTER — Ambulatory Visit (INDEPENDENT_AMBULATORY_CARE_PROVIDER_SITE_OTHER): Payer: 59 | Admitting: Family Medicine

## 2023-05-20 VITALS — BP 138/80 | HR 69 | Temp 98.7°F | Ht 66.0 in | Wt 164.2 lb

## 2023-05-20 DIAGNOSIS — R0602 Shortness of breath: Secondary | ICD-10-CM | POA: Diagnosis not present

## 2023-05-20 DIAGNOSIS — J0100 Acute maxillary sinusitis, unspecified: Secondary | ICD-10-CM | POA: Diagnosis not present

## 2023-05-20 MED ORDER — ALBUTEROL SULFATE HFA 108 (90 BASE) MCG/ACT IN AERS: 1.0000 | INHALATION_SPRAY | Freq: Four times a day (QID) | RESPIRATORY_TRACT | 0 refills | Status: DC | PRN

## 2023-05-20 MED ORDER — AMOXICILLIN-POT CLAVULANATE 875-125 MG PO TABS: 1.0000 | ORAL_TABLET | Freq: Two times a day (BID) | ORAL | 0 refills | Status: DC

## 2023-05-20 NOTE — Progress Notes (Unsigned)
Started with sx 05/14/23.  UC 05/16/23.   Neg covid and strep.  Was having fevers last week.  No chills.  Chest congested.  ST.  Can get SOB and SABA helps.  Sputum is greenish.  Started on amoxil in the meantime. 875mg  BID.  No ear pain.  Maxillary pain.  Dec in appetite.    Meds, vitals, and allergies reviewed.   ROS: Per HPI unless specifically indicated in ROS section   Nad Ncat TM wnl B Max sinuses ttp B MMM Nasal exam stuffy Neck supple, no LA rrr Ctab Skin well-perfused.

## 2023-05-20 NOTE — Patient Instructions (Signed)
Rest, fluids, albuterol if needed.  Stop amoxil and change of augmentin.  Out of work for now.  Take care.  Glad to see you.

## 2023-05-21 ENCOUNTER — Encounter (INDEPENDENT_AMBULATORY_CARE_PROVIDER_SITE_OTHER): Payer: 59

## 2023-05-21 DIAGNOSIS — F331 Major depressive disorder, recurrent, moderate: Secondary | ICD-10-CM

## 2023-05-21 DIAGNOSIS — J0100 Acute maxillary sinusitis, unspecified: Secondary | ICD-10-CM | POA: Insufficient documentation

## 2023-05-21 DIAGNOSIS — F411 Generalized anxiety disorder: Secondary | ICD-10-CM | POA: Diagnosis not present

## 2023-05-21 DIAGNOSIS — G4700 Insomnia, unspecified: Secondary | ICD-10-CM

## 2023-05-21 NOTE — Assessment & Plan Note (Signed)
Okay for outpatient follow-up.  Discussed options. Rest, fluids, albuterol if needed.  Stop amoxil and change of augmentin.  Out of work for now.  He agrees with plan.

## 2023-05-22 ENCOUNTER — Telehealth: Payer: Self-pay | Admitting: Physical Therapy

## 2023-05-22 ENCOUNTER — Ambulatory Visit: Payer: 59 | Admitting: Physical Therapy

## 2023-05-22 NOTE — Telephone Encounter (Signed)

## 2023-05-22 NOTE — Telephone Encounter (Signed)
Called and informed patient of missed visit and provided reminder of next appt and attendance policy via VM. 

## 2023-05-27 ENCOUNTER — Telehealth: Payer: Self-pay | Admitting: Physical Therapy

## 2023-05-27 ENCOUNTER — Ambulatory Visit: Payer: 59 | Admitting: Physical Therapy

## 2023-05-27 NOTE — Telephone Encounter (Signed)
Called and informed patient of missed visit and provided reminder of next appt and attendance policy via VM.  Remaining visit canceled.  He can call to schedule 1 visit at a time moving forward.

## 2023-05-29 ENCOUNTER — Ambulatory Visit: Payer: 59 | Admitting: Physical Therapy

## 2023-06-02 ENCOUNTER — Encounter: Payer: 59 | Admitting: Orthopedic Surgery

## 2023-06-08 ENCOUNTER — Other Ambulatory Visit: Payer: Self-pay | Admitting: Primary Care

## 2023-09-10 NOTE — Telephone Encounter (Signed)
Please notify patient that I cannot provide a work note without evaluation.  If he would like, we can conduct a virtual visit.

## 2023-09-12 ENCOUNTER — Encounter: Payer: Self-pay | Admitting: Primary Care

## 2023-09-12 ENCOUNTER — Telehealth (INDEPENDENT_AMBULATORY_CARE_PROVIDER_SITE_OTHER): Payer: Medicaid Other | Admitting: Primary Care

## 2023-09-12 VITALS — BP 129/77 | Temp 98.6°F | Ht 66.0 in | Wt 164.0 lb

## 2023-09-12 DIAGNOSIS — G4700 Insomnia, unspecified: Secondary | ICD-10-CM | POA: Diagnosis not present

## 2023-09-12 DIAGNOSIS — F331 Major depressive disorder, recurrent, moderate: Secondary | ICD-10-CM | POA: Diagnosis not present

## 2023-09-12 DIAGNOSIS — F411 Generalized anxiety disorder: Secondary | ICD-10-CM | POA: Diagnosis not present

## 2023-09-12 DIAGNOSIS — J101 Influenza due to other identified influenza virus with other respiratory manifestations: Secondary | ICD-10-CM

## 2023-09-12 HISTORY — DX: Influenza due to other identified influenza virus with other respiratory manifestations: J10.1

## 2023-09-12 MED ORDER — CITALOPRAM HYDROBROMIDE 20 MG PO TABS
20.0000 mg | ORAL_TABLET | Freq: Every day | ORAL | 0 refills | Status: AC
Start: 2023-09-12 — End: ?

## 2023-09-12 MED ORDER — ZOLPIDEM TARTRATE 5 MG PO TABS
5.0000 mg | ORAL_TABLET | Freq: Every evening | ORAL | 0 refills | Status: DC | PRN
Start: 2023-09-12 — End: 2024-02-19

## 2023-09-12 NOTE — Patient Instructions (Signed)
Start citalopram 20 mg for anxiety/depression.  Take 1/2 tablet by mouth daily x 1 week, then increase to 1 full tablet thereafter.  You may return to work Sunday night as scheduled.  Please schedule your physical for late April 2025.  It was a pleasure to see you today!

## 2023-09-12 NOTE — Assessment & Plan Note (Signed)
Uncontrolled.  Agree to resume citalopram 20 mg daily.  Recommended to milligrams daily x 1 week, then increase to 20 mg daily thereafter.  Recommend referral back to psychiatry if he fails treatment on citalopram again.

## 2023-09-12 NOTE — Assessment & Plan Note (Signed)
Appears well today.  Will provide work note to return to work on Sunday as scheduled.

## 2023-09-12 NOTE — Progress Notes (Signed)
Patient ID: Carl Moore, male    DOB: 1981-07-14, 43 y.o.   MRN: [id number]  Virtual visit completed through caregility, a video enabled telemedicine application. Due to national recommendations of social distancing due to COVID-19, a virtual visit is felt to be most appropriate for this patient at this time. Reviewed limitations, risks, security and privacy concerns of performing a virtual visit and the availability of in person appointments. I also reviewed that there may be a patient responsible charge related to this service. The patient agreed to proceed.   Patient location: car Provider location: [place] [facility], office Persons participating in this virtual visit: patient, provider   If any vitals were documented, they were collected by patient at home unless specified below.    BP 129/77   Temp 98.6 F (37 C) (Oral)   Ht 5\' 6"  (1.676 m)   Wt 164 lb (74.4 kg)   BMI 26.47 kg/m    CC: Influenza and follow up. Subjective:   HPI: Carl Moore is a 43 y.o. male with a history of maxillary sinusitis, GAD, insomnia, MDD, seasonal allergies presenting on 09/12/2023 for Influenza (Had positive at home test needs note to go back to work. )  1) Influenza B: Symptom onset one week ago with chills, body aches, fevers, cough, fatigue. He took a home flu test last weekend which was positive for influenza B. He missed work everyday this week.  He was scheduled for today but is scheduled to return Sunday night this week.  He is needing a note to allow him to return to work as scheduled.  1) GAD/MDD: Chronic history for years.  Has tried and failed numerous medications.  Evaluated by psychiatry previously, no longer follows.  He would like to resume citalopram as he felt the best on this medication.  Since off of citalopram he is experienced symptoms including mind racing thoughts, irritability, feeling down/depressed.   He is also needing a refill of his Ambien.     Relevant  past medical, surgical, family and social history reviewed and updated as indicated. Interim medical history since our last visit reviewed. Allergies and medications reviewed and updated. Outpatient Medications Prior to Visit  Medication Sig Dispense Refill   albuterol (VENTOLIN HFA) 108 (90 Base) MCG/ACT inhaler Inhale 1-2 puffs into the lungs every 6 (six) hours as needed (cough). 8 g 0   ELDERBERRY PO Take 1 tablet by mouth daily.     amoxicillin-clavulanate (AUGMENTIN) 875-125 MG tablet Take 1 tablet by mouth 2 (two) times daily. 20 tablet 0   zolpidem (AMBIEN) 5 MG tablet Take 5 mg by mouth at bedtime as needed. (Patient not taking: Reported on 09/12/2023)     No facility-administered medications prior to visit.     Per HPI unless specifically indicated in ROS section below Review of Systems  Constitutional:  Negative for chills, fatigue and fever.  Respiratory:  Negative for cough.   Psychiatric/Behavioral:  Positive for sleep disturbance. The patient is nervous/anxious.    Objective:  BP 129/77   Temp 98.6 F (37 C) (Oral)   Ht 5\' 6"  (1.676 m)   Wt 164 lb (74.4 kg)   BMI 26.47 kg/m   Wt Readings from Last 3 Encounters:  09/12/23 164 lb (74.4 kg)  05/20/23 164 lb 3.2 oz (74.5 kg)  04/24/23 170 lb (77.1 kg)       Physical exam: General: Alert and oriented x 3, no distress, does not appear sickly  Pulmonary:  Speaks in complete sentences without increased work of breathing, no cough during visit.  Psychiatric: Normal mood, thought content, and behavior.     Results for orders placed or performed during the hospital encounter of 04/24/23  Basic metabolic panel   Collection Time: 04/24/23  9:46 AM  Result Value Ref Range   Sodium 133 (L) 135 - 145 mmol/L   Potassium 3.8 3.5 - 5.1 mmol/L   Chloride 102 98 - 111 mmol/L   CO2 22 22 - 32 mmol/L   Glucose, Bld 87 70 - 99 mg/dL   BUN 24 (H) 6 - 20 mg/dL   Creatinine, Ser 4.09 0.61 - 1.24 mg/dL   Calcium 8.6 (L) 8.9 -  10.3 mg/dL   GFR, Estimated >81 >19 mL/min   Anion gap 9 5 - 15   Assessment & Plan:   Problem List Items Addressed This Visit       Respiratory   Influenza B   Appears well today.  Will provide work note to return to work on Sunday as scheduled.        Other   Insomnia   Deteriorated.  Will review PDMP. Refill Ambien 5 mg at bedtime as needed.      Relevant Medications   zolpidem (AMBIEN) 5 MG tablet   GAD (generalized anxiety disorder) - Primary   Uncontrolled.  Agree to resume citalopram 20 mg daily.  Recommended to milligrams daily x 1 week, then increase to 20 mg daily thereafter.  Recommend referral back to psychiatry if he fails treatment on citalopram again.      Relevant Medications   citalopram (CELEXA) 20 MG tablet   Moderate episode of recurrent major depressive disorder (HCC)   Uncontrolled.  Agree to resume citalopram 20 mg daily.  Recommended to milligrams daily x 1 week, then increase to 20 mg daily thereafter.  Recommend referral back to psychiatry if he fails treatment on citalopram again.      Relevant Medications   citalopram (CELEXA) 20 MG tablet     Meds ordered this encounter  Medications   zolpidem (AMBIEN) 5 MG tablet    Sig: Take 1 tablet (5 mg total) by mouth at bedtime as needed. For sleep    Dispense:  90 tablet    Refill:  0    Supervising Provider:   BEDSOLE, AMY E [[id number]]   citalopram (CELEXA) 20 MG tablet    Sig: Take 1 tablet (20 mg total) by mouth daily. for anxiety and depression.    Dispense:  90 tablet    Refill:  0    Supervising Provider:   BEDSOLE, AMY E [[id number]]   No orders of the defined types were placed in this encounter.   I discussed the assessment and treatment plan with the patient. The patient was provided an opportunity to ask questions and all were answered. The patient agreed with the plan and demonstrated an understanding of the instructions. The patient was advised to call back or seek an in-person  evaluation if the symptoms worsen or if the condition fails to improve as anticipated.  Follow up plan:  Start citalopram 20 mg for anxiety/depression.  Take 1/2 tablet by mouth daily x 1 week, then increase to 1 full tablet thereafter.  You may return to work Sunday night as scheduled.  Please schedule your physical for late April 2025.  It was a pleasure to see you today!   Doreene Nest, NP

## 2023-09-12 NOTE — Assessment & Plan Note (Signed)
Deteriorated.  Will review PDMP. Refill Ambien 5 mg at bedtime as needed.

## 2023-10-03 ENCOUNTER — Ambulatory Visit: Payer: Self-pay | Admitting: Primary Care

## 2023-10-03 ENCOUNTER — Telehealth: Admitting: Physician Assistant

## 2023-10-03 DIAGNOSIS — J988 Other specified respiratory disorders: Secondary | ICD-10-CM

## 2023-10-03 DIAGNOSIS — B9789 Other viral agents as the cause of diseases classified elsewhere: Secondary | ICD-10-CM | POA: Diagnosis not present

## 2023-10-03 MED ORDER — FLUTICASONE PROPIONATE 50 MCG/ACT NA SUSP
2.0000 | Freq: Every day | NASAL | 0 refills | Status: AC
Start: 2023-10-03 — End: ?

## 2023-10-03 MED ORDER — PROMETHAZINE-DM 6.25-15 MG/5ML PO SYRP
5.0000 mL | ORAL_SOLUTION | Freq: Four times a day (QID) | ORAL | 0 refills | Status: AC | PRN
Start: 2023-10-03 — End: ?

## 2023-10-03 MED ORDER — LIDOCAINE VISCOUS HCL 2 % MT SOLN
OROMUCOSAL | 0 refills | Status: AC
Start: 2023-10-03 — End: ?

## 2023-10-03 NOTE — Progress Notes (Signed)
 Virtual Visit Consent   Jennette Bill, you are scheduled for a virtual visit with a [facility] provider today. Just as with appointments in the office, your consent must be obtained to participate. Your consent will be active for this visit and any virtual visit you may have with one of our providers in the next 365 days. If you have a MyChart account, a copy of this consent can be sent to you electronically.  As this is a virtual visit, video technology does not allow for your provider to perform a traditional examination. This may limit your provider's ability to fully assess your condition. If your provider identifies any concerns that need to be evaluated in person or the need to arrange testing (such as labs, EKG, etc.), we will make arrangements to do so. Although advances in technology are sophisticated, we cannot ensure that it will always work on either your end or our end. If the connection with a video visit is poor, the visit may have to be switched to a telephone visit. With either a video or telephone visit, we are not always able to ensure that we have a secure connection.  By engaging in this virtual visit, you consent to the provision of healthcare and authorize for your insurance to be billed (if applicable) for the services provided during this visit. Depending on your insurance coverage, you may receive a charge related to this service.  I need to obtain your verbal consent now. Are you willing to proceed with your visit today? Carl Moore has provided verbal consent on 10/03/2023 for a virtual visit (video or telephone). Margaretann Loveless, PA-C  Date: 10/03/2023 2:57 PM   Virtual Visit via Video Note   I, Margaretann Loveless, connected with  Carl Moore  ([id number], 03-25-1981) on 10/03/23 at  2:45 PM EST by a video-enabled telemedicine application and verified that I am speaking with the correct person using two identifiers.  Location: Patient: Virtual Visit  Location Patient: Home Provider: Virtual Visit Location Provider: Home Office   I discussed the limitations of evaluation and management by telemedicine and the availability of in person appointments. The patient expressed understanding and agreed to proceed.    History of Present Illness: Carl Moore is a 43 y.o. who identifies as a male who was assigned male at birth, and is being seen today for URI symptoms.  HPI: URI  This is a new problem. The current episode started yesterday. The problem has been unchanged. The maximum temperature recorded prior to his arrival was 100.4 - 100.9 F. The fever has been present for Less than 1 day. Associated symptoms include congestion, coughing (intermittently productive with green mucus), diarrhea (yesterday), headaches, rhinorrhea (and post nasal drainage), sinus pain (maxillary) and a sore throat. Pertinent negatives include no chest pain, ear pain, nausea, plugged ear sensation, vomiting or wheezing. Associated symptoms comments: Chills. Treatments tried: ibuprofen, mucinex. The treatment provided no relief.  Did have sick contact with his girlfriend, just viral respiratory Has not tested for anything.    Problems:  Patient Active Problem List   Diagnosis Date Noted   Influenza B 09/12/2023   Moderate episode of recurrent major depressive disorder (HCC) 11/14/2022   GAD (generalized anxiety disorder) 09/13/2020   Chronic left shoulder pain 12/24/2019   Insomnia 09/21/2019   Elevated blood pressure reading 08/27/2018   Hypertriglyceridemia 05/10/2016   Seasonal allergies 12/19/2014    Allergies:  Allergies  Allergen Reactions   Zoloft [Sertraline] Rash  Doxycycline Rash   Medications:  Current Outpatient Medications:    fluticasone (FLONASE) 50 MCG/ACT nasal spray, Place 2 sprays into both nostrils daily., Disp: 16 g, Rfl: 0   lidocaine (XYLOCAINE) 2 % solution, Swallow 5mL every 4-6 hours for sore throat, Disp: 100 mL, Rfl: 0    promethazine-dextromethorphan (PROMETHAZINE-DM) 6.25-15 MG/5ML syrup, Take 5 mLs by mouth 4 (four) times daily as needed., Disp: 118 mL, Rfl: 0   albuterol (VENTOLIN HFA) 108 (90 Base) MCG/ACT inhaler, Inhale 1-2 puffs into the lungs every 6 (six) hours as needed (cough)., Disp: 8 g, Rfl: 0   citalopram (CELEXA) 20 MG tablet, Take 1 tablet (20 mg total) by mouth daily. for anxiety and depression., Disp: 90 tablet, Rfl: 0   ELDERBERRY PO, Take 1 tablet by mouth daily., Disp: , Rfl:    zolpidem (AMBIEN) 5 MG tablet, Take 1 tablet (5 mg total) by mouth at bedtime as needed. For sleep, Disp: 90 tablet, Rfl: 0  Observations/Objective: Patient is well-developed, well-nourished in no acute distress.  Resting comfortably at home.  Head is normocephalic, atraumatic.  No labored breathing.  Speech is clear and coherent with logical content.  Patient is alert and oriented at baseline.    Assessment and Plan: 1. Viral respiratory illness (Primary) - fluticasone (FLONASE) 50 MCG/ACT nasal spray; Place 2 sprays into both nostrils daily.  Dispense: 16 g; Refill: 0 - promethazine-dextromethorphan (PROMETHAZINE-DM) 6.25-15 MG/5ML syrup; Take 5 mLs by mouth 4 (four) times daily as needed.  Dispense: 118 mL; Refill: 0 - lidocaine (XYLOCAINE) 2 % solution; Swallow 5mL every 4-6 hours for sore throat  Dispense: 100 mL; Refill: 0  - Suspect viral URI - Symptomatic medications of choice over the counter as needed - I have prescribed Flonase for nasal congestion, Promethazine DM for cough, and Viscous Lidocaine for sore throat - Push fluids - Rest - Seek further evaluation if symptoms change or worsen   Follow Up Instructions: I discussed the assessment and treatment plan with the patient. The patient was provided an opportunity to ask questions and all were answered. The patient agreed with the plan and demonstrated an understanding of the instructions.  A copy of instructions were sent to the patient via  MyChart unless otherwise noted below.    The patient was advised to call back or seek an in-person evaluation if the symptoms worsen or if the condition fails to improve as anticipated.    Margaretann Loveless, PA-C

## 2023-10-03 NOTE — Telephone Encounter (Signed)
 Noted.

## 2023-10-03 NOTE — Patient Instructions (Signed)
 Carl Moore, thank you for joining Margaretann Loveless, PA-C for today's virtual visit.  While this provider is not your primary care provider (PCP), if your PCP is located in our provider database this encounter information will be shared with them immediately following your visit.   A [facility] MyChart account gives you access to today's visit and all your visits, tests, and labs performed at [facility] " click here if you don't have a [facility] MyChart account or go to [email or web address]  Consent: (Patient) Carl Moore provided verbal consent for this virtual visit at the beginning of the encounter.  Current Medications:  Current Outpatient Medications:    fluticasone (FLONASE) 50 MCG/ACT nasal spray, Place 2 sprays into both nostrils daily., Disp: 16 g, Rfl: 0   lidocaine (XYLOCAINE) 2 % solution, Swallow 5mL every 4-6 hours for sore throat, Disp: 100 mL, Rfl: 0   promethazine-dextromethorphan (PROMETHAZINE-DM) 6.25-15 MG/5ML syrup, Take 5 mLs by mouth 4 (four) times daily as needed., Disp: 118 mL, Rfl: 0   albuterol (VENTOLIN HFA) 108 (90 Base) MCG/ACT inhaler, Inhale 1-2 puffs into the lungs every 6 (six) hours as needed (cough)., Disp: 8 g, Rfl: 0   citalopram (CELEXA) 20 MG tablet, Take 1 tablet (20 mg total) by mouth daily. for anxiety and depression., Disp: 90 tablet, Rfl: 0   ELDERBERRY PO, Take 1 tablet by mouth daily., Disp: , Rfl:    zolpidem (AMBIEN) 5 MG tablet, Take 1 tablet (5 mg total) by mouth at bedtime as needed. For sleep, Disp: 90 tablet, Rfl: 0   Medications ordered in this encounter:  Meds ordered this encounter  Medications   fluticasone (FLONASE) 50 MCG/ACT nasal spray    Sig: Place 2 sprays into both nostrils daily.    Dispense:  16 g    Refill:  0    Supervising Provider:   Merrilee Jansky [[id number]]   promethazine-dextromethorphan (PROMETHAZINE-DM) 6.25-15 MG/5ML syrup    Sig: Take 5 mLs by mouth 4 (four) times daily as  needed.    Dispense:  118 mL    Refill:  0    Supervising Provider:   Merrilee Jansky [[id number]]   lidocaine (XYLOCAINE) 2 % solution    Sig: Swallow 5mL every 4-6 hours for sore throat    Dispense:  100 mL    Refill:  0    Supervising Provider:   Merrilee Jansky [[id number]]     *If you need refills on other medications prior to your next appointment, please contact your pharmacy*  Follow-Up: Call back or seek an in-person evaluation if the symptoms worsen or if the condition fails to improve as anticipated.  [facility] [phone]  Other Instructions Viral Respiratory Infection A respiratory infection is an illness that affects part of the respiratory system, such as the lungs, nose, or throat. A respiratory infection that is caused by a virus is called a viral respiratory infection. Common types of viral respiratory infections include: A cold. The flu (influenza). A respiratory syncytial virus (RSV) infection. What are the causes? This condition is caused by a virus. The virus may spread through contact with droplets or direct contact with infected people or their mucus or secretions. The virus may spread from person to person (is contagious). What are the signs or symptoms? Symptoms of this condition include: A stuffy or runny nose. A sore throat or cough. Shortness of breath or difficulty breathing. Yellow or green mucus (sputum). Other symptoms may  include: A fever. Sweating or chills. Fatigue. Achy muscles. A headache. How is this diagnosed? This condition may be diagnosed based on: Your symptoms. A physical exam. Testing of secretions from the nose or throat. Chest X-ray. How is this treated? This condition may be treated with medicines, such as: Antiviral medicine. This may shorten the length of time a person has symptoms. Expectorants. These make it easier to cough up mucus. Decongestant nasal sprays. Acetaminophen or NSAIDs, such as  ibuprofen, to relieve fever and pain. Antibiotic medicines are not prescribed for viral infections.This is because antibiotics are designed to kill bacteria. They do not kill viruses. Follow these instructions at home: Managing pain and congestion Take over-the-counter and prescription medicines only as told by your health care provider. If you have a sore throat, gargle with a mixture of salt and water 3-4 times a day or as needed. To make salt water, completely dissolve -1 tsp (3-6 g) of salt in 1 cup (237 mL) of warm water. Use nose drops made from salt water to ease congestion and soften raw skin around your nose. Take 2 tsp (10 mL) of honey at bedtime to lessen coughing at night. Do not give honey to children who are younger than 1 year. Drink enough fluid to keep your urine pale yellow. This helps prevent dehydration and helps loosen up mucus. General instructions  Rest as much as possible. Do not drink alcohol. Do not use any products that contain nicotine or tobacco. These products include cigarettes, chewing tobacco, and vaping devices, such as e-cigarettes. If you need help quitting, ask your health care provider. Keep all follow-up visits. This is important. How is this prevented?     Get an annual flu shot. You may get the flu shot in late summer, fall, or winter. Ask your health care provider when you should get your flu shot. Avoid spreading your infection to other people. If you are sick: Wash your hands with soap and water often, especially after you cough or sneeze. Wash for at least 20 seconds. If soap and water are not available, use alcohol-based hand sanitizer. Cover your mouth when you cough. Cover your nose and mouth when you sneeze. Do not share cups or eating utensils. Clean commonly used objects often. Clean commonly touched surfaces. Stay home from work or school as told by your health care provider. Avoid contact with people who are sick during cold and flu  season. This is generally fall and winter. Contact a health care provider if: Your symptoms last for 10 days or longer. Your symptoms get worse over time. You have severe sinus pain in your face or forehead. The glands in your jaw or neck become very swollen. You have shortness of breath. Get help right away if you: Feel pain or pressure in your chest. Have trouble breathing. Faint or feel like you will faint. Have severe and persistent vomiting. Feel confused or disoriented. These symptoms may represent a serious problem that is an emergency. Do not wait to see if the symptoms will go away. Get medical help right away. Call your local emergency services (911 in the [place] Do not drive yourself to the hospital. Summary A respiratory infection is an illness that affects part of the respiratory system, such as the lungs, nose, or throat. A respiratory infection that is caused by a virus is called a viral respiratory infection. Common types of viral respiratory infections include a cold, influenza, and respiratory syncytial virus (RSV) infection. Symptoms of this condition  include a stuffy or runny nose, cough, fatigue, achy muscles, sore throat, and fevers or chills. Antibiotic medicines are not prescribed for viral infections. This is because antibiotics are designed to kill bacteria. They are not effective against viruses. This information is not intended to replace advice given to you by your health care provider. Make sure you discuss any questions you have with your health care provider. Document Revised: 10/19/2020 Document Reviewed: 10/19/2020 Elsevier Patient Education  2024 Elsevier Inc.   If you have been instructed to have an in-person evaluation today at a local Urgent Care facility, please use the link below. It will take you to a list of all of our available [facility], including address, phone number and hours of operation. Please do not delay care.  [facility]  If you or a family member do not have a primary care provider, use the link below to schedule a visit and establish care. When you choose a [facility] primary care physician or advanced practice provider, you gain a long-term partner in health. Find a Primary Care Provider  Learn more about [facility]'s in-office and virtual care options: [facility] - Get Care Now

## 2023-10-03 NOTE — Telephone Encounter (Signed)
 Copied from CRM [id number]. Topic: Clinical - Red Word Triage >> Oct 03, 2023 12:33 PM Adele Barthel wrote: Red Word that prompted transfer to Nurse Triage:   Fever, 100 was highest temp Chills  Body aches Headaches Sore throat Cough is intermittent Known exposure to illness  Symptom started yesterday   Chief Complaint: Cold Symptoms Symptoms: coughing up green mucous, diarrhea, sore throat, runny nose and nasal congestion Frequency: x 1 day Pertinent Negatives: Patient denies difficulty breathing, nausea, vomiting Disposition: [] ED /[x] Urgent Care (no appt availability in office) / [] Appointment(In office/virtual)/ []  [facility]/ [] Home Care/ [] Refused Recommended Disposition /[] [facility]/ []  Follow-up with PCP Additional Notes: Patient called and advised that for the past day he has been having cold/virus symptoms.  He said he had a fever around 100 last night but has not had a fever today. Patient denies any difficulty breathing, chest pain, nausea/vomiting, or fever today. Patient states that he has a slight intermittent cough and has green mucous.  Patient states he was around some people who were sick with a common cold/virus recently and he is having some sinus pain/congestion around his eyes as well.  Patient is given Care Advice as per protocol and patient also wanted a virtual appointment if possible.  No availability in patient's PCP office but a Virtual Appointment was set up with Virtual Urgent Care for today 10/03/2023 at 2:45 pm.  Patient is also advised that if he gets worse to go to the Emergency Room.  Patient verbalized understanding.    Reason for Disposition  [1] Sinus congestion as part of a cold AND [2] present < 10 days  Protocols used: Sinus Pain or Congestion-A-AH

## 2023-10-23 ENCOUNTER — Ambulatory Visit: Admitting: Family

## 2024-01-07 ENCOUNTER — Emergency Department
Admission: EM | Admit: 2024-01-07 | Discharge: 2024-01-07 | Disposition: A | Discharge status: Home / Self Care | Attending: Emergency Medicine | Admitting: Emergency Medicine

## 2024-01-07 ENCOUNTER — Other Ambulatory Visit: Payer: Self-pay

## 2024-01-07 ENCOUNTER — Emergency Department

## 2024-01-07 DIAGNOSIS — I10 Essential (primary) hypertension: Secondary | ICD-10-CM | POA: Insufficient documentation

## 2024-01-07 DIAGNOSIS — R5383 Other fatigue: Secondary | ICD-10-CM | POA: Diagnosis not present

## 2024-01-07 DIAGNOSIS — R0789 Other chest pain: Secondary | ICD-10-CM | POA: Insufficient documentation

## 2024-01-07 LAB — CBC
HCT: 49.9 % (ref 39.0–52.0)
Hemoglobin: 16.9 g/dL (ref 13.0–17.0)
MCH: 29.8 pg (ref 26.0–34.0)
MCHC: 33.9 g/dL (ref 30.0–36.0)
MCV: 87.9 fL (ref 80.0–100.0)
Platelets: 347 10*3/uL (ref 150–400)
RBC: 5.68 MIL/uL (ref 4.22–5.81)
RDW: 12.7 % (ref 11.5–15.5)
WBC: 6.9 10*3/uL (ref 4.0–10.5)
nRBC: 0 % (ref 0.0–0.2)

## 2024-01-07 LAB — BASIC METABOLIC PANEL WITH GFR
Anion gap: 10 (ref 5–15)
BUN: 10 mg/dL (ref 6–20)
CO2: 22 mmol/L (ref 22–32)
Calcium: 9.1 mg/dL (ref 8.9–10.3)
Chloride: 105 mmol/L (ref 98–111)
Creatinine, Ser: 1.1 mg/dL (ref 0.61–1.24)
GFR, Estimated: >60 mL/min (ref >60)
Glucose, Bld: 103 mg/dL — ABNORMAL HIGH (ref 70–99)
Potassium: 4 mmol/L (ref 3.5–5.1)
Sodium: 137 mmol/L (ref 135–145)

## 2024-01-07 LAB — TROPONIN I (HIGH SENSITIVITY): Troponin I (High Sensitivity): 3 ng/L (ref <18)

## 2024-01-07 MED ORDER — LIDOCAINE VISCOUS HCL 2 % MT SOLN
15.0000 mL | Freq: Once | OROMUCOSAL | Status: AC
  Administered 2024-01-07: 15 mL via ORAL
  Filled 2024-01-07: qty 15

## 2024-01-07 MED ORDER — OMEPRAZOLE MAGNESIUM 20 MG PO TBEC: 20.0000 mg | DELAYED_RELEASE_TABLET | Freq: Every day | ORAL | 1 refills | Status: AC

## 2024-01-07 MED ORDER — ALUM & MAG HYDROXIDE-SIMETH 200-200-20 MG/5ML PO SUSP
30.0000 mL | Freq: Once | ORAL | Status: AC
  Administered 2024-01-07: 30 mL via ORAL
  Filled 2024-01-07: qty 30

## 2024-01-07 NOTE — ED Provider Notes (Signed)
 [facility] Provider Note    Event Date/Time   First MD Initiated Contact with Patient 01/07/24 [phone]     (approximate)   History   Chest Pain   HPI  Carl Moore is a 43 y.o. male with a history of hypertension, seasonal allergies who presents with complaints of chest pain.  Patient reports he felt well yesterday although somewhat fatigued, around 1145 midnight, he woke  up from sleep with mild chest discomfort, he reports he thinks this continued throughout the night.  He describes it as a mild pressure at this time but improved.  No shortness of breath at this time    Physical Exam   Triage Vital Signs: ED Triage Vitals  Encounter Vitals Group     BP 01/07/24 0910 (!) 146/101     Systolic BP Percentile --      Diastolic BP Percentile --      Pulse Rate 01/07/24 0910 81     Resp 01/07/24 0910 20     Temp 01/07/24 0910 98.5 F (36.9 C)     Temp Source 01/07/24 0910 Oral     SpO2 01/07/24 0910 98 %     Weight 01/07/24 0909 74.8 kg (165 lb)     Height 01/07/24 0909 1.676 m (5' 6)     Head Circumference --      Peak Flow --      Pain Score 01/07/24 0909 7     Pain Loc --      Pain Education --      Exclude from Growth Chart --     Most recent vital signs: Vitals:   01/07/24 0910  BP: (!) 146/101  Pulse: 81  Resp: 20  Temp: 98.5 F (36.9 C)  SpO2: 98%     General: Awake, no distress.  CV:  Good peripheral perfusion.  Regular rate and rhythm Resp:  Normal effort.  Clear to auscultation bilaterally Abd:  No distention.  Other:  No calf pain or swelling   ED Results / Procedures / Treatments   Labs (all labs ordered are listed, but only abnormal results are displayed) Labs Reviewed  BASIC METABOLIC PANEL WITH GFR - Abnormal; Notable for the following components:      Result Value   Glucose, Bld 103 (*)    All other components within normal limits  CBC  TROPONIN I (HIGH SENSITIVITY)     EKG  ED ECG REPORT I, Bryson Carbine, the attending physician, personally viewed and interpreted this ECG.  Date: 01/07/2024  Rhythm: normal sinus rhythm QRS Axis: normal Intervals: normal ST/T Wave abnormalities: normal Narrative Interpretation: no evidence of acute ischemia    RADIOLOGY Chest x-ray viewed interpret by me, no acute abnormality    PROCEDURES:  Critical Care performed:   Procedures   MEDICATIONS ORDERED IN ED: Medications  alum & mag hydroxide-simeth (MAALOX/MYLANTA) 200-200-20 MG/5ML suspension 30 mL (30 mLs Oral Given 01/07/24 0949)    And  lidocaine  (XYLOCAINE ) 2 % viscous mouth solution 15 mL (15 mLs Oral Given 01/07/24 0949)     IMPRESSION / MDM / ASSESSMENT AND PLAN / ED COURSE  I reviewed the triage vital signs and the nursing notes. Patient's presentation is most consistent with acute presentation with potential threat to life or bodily function.  Patient presents with chest pain as detailed above, differential includes ACS, angina, GERD, less likely pneumonia  EKG is reassuring, pending high sensitive troponin, x-ray pending  High sensitive troponin is  normal.  X-ray is unremarkable.  Question possible GERD, will trial GI cocktail and reevaluate  Patient had complete resolution of symptoms after Maalox and viscous lidocaine .  Given reassuring workup no indication for admission at this time, will place cardiology referral, start on omeprazole, strict return precautions, he agrees with this plan      FINAL CLINICAL IMPRESSION(S) / ED DIAGNOSES   Final diagnoses:  Atypical chest pain     Rx / DC Orders   ED Discharge Orders          Ordered    Ambulatory referral to Cardiology       Comments: If you have not heard from the Cardiology office within the next 72 hours please call [phone].   01/07/24 1022    omeprazole (PRILOSEC OTC) 20 MG tablet  Daily        01/07/24 1022             Note:  This document was prepared using Dragon voice recognition  software and may include unintentional dictation errors.   Bryson Carbine, MD 01/07/24 1028

## 2024-01-07 NOTE — ED Triage Notes (Signed)
 Pt to ED via POV from home. Pt reports centralized CP and SOB that started at midnight and has been constant. CP is non-radiating.

## 2024-01-09 ENCOUNTER — Encounter: Payer: Self-pay | Admitting: Primary Care

## 2024-01-09 ENCOUNTER — Ambulatory Visit (INDEPENDENT_AMBULATORY_CARE_PROVIDER_SITE_OTHER): Admitting: Primary Care

## 2024-01-09 VITALS — BP 146/84 | HR 90 | Temp 100.0°F | Ht 66.0 in | Wt 160.0 lb

## 2024-01-09 DIAGNOSIS — R079 Chest pain, unspecified: Secondary | ICD-10-CM | POA: Diagnosis not present

## 2024-01-09 DIAGNOSIS — F331 Major depressive disorder, recurrent, moderate: Secondary | ICD-10-CM

## 2024-01-09 DIAGNOSIS — R0602 Shortness of breath: Secondary | ICD-10-CM | POA: Diagnosis not present

## 2024-01-09 DIAGNOSIS — F411 Generalized anxiety disorder: Secondary | ICD-10-CM

## 2024-01-09 MED ORDER — CITALOPRAM HYDROBROMIDE 20 MG PO TABS: 20.0000 mg | ORAL_TABLET | Freq: Every day | ORAL | 2 refills | Status: DC

## 2024-01-09 MED ORDER — ALBUTEROL SULFATE HFA 108 (90 BASE) MCG/ACT IN AERS: 1.0000 | INHALATION_SPRAY | Freq: Four times a day (QID) | RESPIRATORY_TRACT | 0 refills | Status: AC | PRN
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
## 2024-01-09 NOTE — Assessment & Plan Note (Signed)
 Controlled.  Continue citalopram  20 mg daily.  Refills provided.

## 2024-01-09 NOTE — Assessment & Plan Note (Addendum)
 With recent ED visit.  ED notes, labs, imaging reviewed. Start omeprazole  20 mg daily as prescribed. He will get this at the pharmacy today.  He will await his referral to cardiology.   Work note Provided.

## 2024-01-09 NOTE — Patient Instructions (Signed)
 Start omeprazole  20 mg daily for heartburn.  I sent refills for citalopram  and your albuterol  inhaler to the pharmacy.  It was a pleasure to see you today!

## 2024-01-28 ENCOUNTER — Other Ambulatory Visit: Payer: Self-pay | Admitting: Primary Care

## 2024-01-28 DIAGNOSIS — R0602 Shortness of breath: Secondary | ICD-10-CM

## 2024-02-18 ENCOUNTER — Other Ambulatory Visit: Payer: Self-pay | Admitting: Primary Care

## 2024-02-18 DIAGNOSIS — G4700 Insomnia, unspecified: Secondary | ICD-10-CM

## 2024-02-23 ENCOUNTER — Emergency Department
Admission: EM | Admit: 2024-02-23 | Discharge: 2024-02-23 | Disposition: A | Discharge status: Home / Self Care | Attending: Emergency Medicine | Admitting: Emergency Medicine

## 2024-02-23 ENCOUNTER — Emergency Department

## 2024-02-23 ENCOUNTER — Ambulatory Visit: Admitting: Family Medicine

## 2024-02-23 ENCOUNTER — Other Ambulatory Visit: Payer: Self-pay

## 2024-02-23 ENCOUNTER — Encounter: Payer: Self-pay | Admitting: Emergency Medicine

## 2024-02-23 DIAGNOSIS — R1031 Right lower quadrant pain: Secondary | ICD-10-CM | POA: Insufficient documentation

## 2024-02-23 LAB — URINALYSIS, ROUTINE W REFLEX MICROSCOPIC
Bilirubin Urine: NEGATIVE
Glucose, UA: NEGATIVE mg/dL
Hgb urine dipstick: NEGATIVE
Ketones, ur: NEGATIVE mg/dL
Leukocytes,Ua: NEGATIVE
Nitrite: NEGATIVE
Protein, ur: NEGATIVE mg/dL
Specific Gravity, Urine: >1.046 — ABNORMAL HIGH (ref 1.005–1.030)
pH: 7 (ref 5.0–8.0)

## 2024-02-23 LAB — BASIC METABOLIC PANEL WITH GFR
Anion gap: 11 (ref 5–15)
BUN: 13 mg/dL (ref 6–20)
CO2: 23 mmol/L (ref 22–32)
Calcium: 8.7 mg/dL — ABNORMAL LOW (ref 8.9–10.3)
Chloride: 100 mmol/L (ref 98–111)
Creatinine, Ser: 1.5 mg/dL — ABNORMAL HIGH (ref 0.61–1.24)
GFR, Estimated: 59 mL/min — ABNORMAL LOW (ref >60)
Glucose, Bld: 106 mg/dL — ABNORMAL HIGH (ref 70–99)
Potassium: 4.3 mmol/L (ref 3.5–5.1)
Sodium: 134 mmol/L — ABNORMAL LOW (ref 135–145)

## 2024-02-23 LAB — CBC
HCT: 54.5 % — ABNORMAL HIGH (ref 39.0–52.0)
Hemoglobin: 17.5 g/dL — ABNORMAL HIGH (ref 13.0–17.0)
MCH: 28.5 pg (ref 26.0–34.0)
MCHC: 32.1 g/dL (ref 30.0–36.0)
MCV: 88.8 fL (ref 80.0–100.0)
Platelets: 414 K/uL — ABNORMAL HIGH (ref 150–400)
RBC: 6.14 MIL/uL — ABNORMAL HIGH (ref 4.22–5.81)
RDW: 12.6 % (ref 11.5–15.5)
WBC: 6.1 K/uL (ref 4.0–10.5)
nRBC: 0 % (ref 0.0–0.2)

## 2024-02-23 LAB — TROPONIN I (HIGH SENSITIVITY)
Troponin I (High Sensitivity): 3 ng/L (ref <18)
Troponin I (High Sensitivity): 3 ng/L (ref <18)

## 2024-02-23 LAB — LIPASE, BLOOD: Lipase: 43 U/L (ref 11–51)

## 2024-02-23 MED ORDER — IOHEXOL 300 MG/ML  SOLN
100.0000 mL | Freq: Once | INTRAMUSCULAR | Status: AC | PRN
  Administered 2024-02-23: 100 mL via INTRAVENOUS

## 2024-02-23 MED ORDER — ONDANSETRON HCL 4 MG/2ML IJ SOLN
4.0000 mg | Freq: Once | INTRAMUSCULAR | Status: AC
  Administered 2024-02-23: 4 mg via INTRAVENOUS
  Filled 2024-02-23: qty 2

## 2024-02-23 MED ORDER — TRAMADOL HCL 50 MG PO TABS
50.0000 mg | ORAL_TABLET | Freq: Four times a day (QID) | ORAL | 0 refills | Status: AC | PRN
Start: 2024-02-23 — End: 2025-02-22

## 2024-02-23 MED ORDER — KETOROLAC TROMETHAMINE 30 MG/ML IJ SOLN
30.0000 mg | Freq: Once | INTRAMUSCULAR | Status: AC
  Administered 2024-02-23: 30 mg via INTRAVENOUS
  Filled 2024-02-23: qty 1

## 2024-02-23 MED ORDER — SODIUM CHLORIDE 0.9 % IV SOLN: Freq: Once | INTRAVENOUS | Status: AC

## 2024-02-23 NOTE — ED Provider Notes (Signed)
 [facility] Provider Note    Event Date/Time   First MD Initiated Contact with Patient 02/23/24 1505     (approximate)   History   Abdominal pain   HPI  Carl Moore is a 43 y.o. male who presents with primary complaint of right lower quadrant abdominal pain.  He did mention chest pain to triage but states he has no chest discomfort at this time and his primary complaint is right lower quadrant abdominal pain.  He reports this is been hurting since Friday, 3 days ago.  No history of appendectomy, does have a history of cholecystectomy.     Physical Exam   Triage Vital Signs: ED Triage Vitals [02/23/24 1254]  Encounter Vitals Group     BP (!) 150/102     Girls Systolic BP Percentile      Girls Diastolic BP Percentile      Boys Systolic BP Percentile      Boys Diastolic BP Percentile      Pulse Rate 90     Resp 17     Temp 98.8 F (37.1 C)     Temp Source Oral     SpO2 96 %     Weight 74.8 kg (165 lb)     Height 1.676 m (5' 6)     Head Circumference      Peak Flow      Pain Score 8     Pain Loc      Pain Education      Exclude from Growth Chart     Most recent vital signs: Vitals:   02/23/24 1254 02/23/24 1255  BP: (!) 150/102 (!) 150/95  Pulse: 90   Resp: 17   Temp: 98.8 F (37.1 C)   SpO2: 96%      General: Awake, no distress.  CV:  Good peripheral perfusion.  Resp:  Normal effort.  Abd:  No distention.  Tenderness in the right lower quadrant, no CVA tenderness Other:     ED Results / Procedures / Treatments   Labs (all labs ordered are listed, but only abnormal results are displayed) Labs Reviewed  BASIC METABOLIC PANEL WITH GFR - Abnormal; Notable for the following components:      Result Value   Sodium 134 (*)    Glucose, Bld 106 (*)    Creatinine, Ser 1.50 (*)    Calcium 8.7 (*)    GFR, Estimated 59 (*)    All other components within normal limits  CBC - Abnormal; Notable for the following components:    RBC 6.14 (*)    Hemoglobin 17.5 (*)    HCT 54.5 (*)    Platelets 414 (*)    All other components within normal limits  LIPASE, BLOOD  URINALYSIS, ROUTINE W REFLEX MICROSCOPIC  TROPONIN I (HIGH SENSITIVITY)  TROPONIN I (HIGH SENSITIVITY)     EKG  ED ECG REPORT I, Lamar Price, the attending physician, personally viewed and interpreted this ECG.  Date: 02/23/2024  Rhythm: normal sinus rhythm QRS Axis: normal Intervals: normal ST/T Wave abnormalities: normal Narrative Interpretation: no evidence of acute ischemia    RADIOLOGY Chest x-ray without acute abnormality    PROCEDURES:  Critical Care performed:   Procedures   MEDICATIONS ORDERED IN ED: Medications  ketorolac  (TORADOL ) 30 MG/ML injection 30 mg (30 mg Intravenous Given 02/23/24 1555)  ondansetron  (ZOFRAN ) injection 4 mg (4 mg Intravenous Given 02/23/24 1555)  0.9 %  sodium chloride  infusion (0 mLs Intravenous Stopped  02/23/24 1658)  iohexol  (OMNIPAQUE ) 300 MG/ML solution 100 mL (100 mLs Intravenous Contrast Given 02/23/24 1541)     IMPRESSION / MDM / ASSESSMENT AND PLAN / ED COURSE  I reviewed the triage vital signs and the nursing notes. Patient's presentation is most consistent with acute presentation with potential threat to life or bodily function.  Patient presents with right lower quadrant abdominal pain as detailed above, differential includes appendicitis, ureterolithiasis, constipation.  Had mentioned chest pain to triage so did obtain high sensitive troponin and EKG both which are reassuring, chest x-ray is without acute abnormality.  Will treat with IV Toradol , IV Zofran , IV fluids, obtain CT abdomen pelvis to evaluate for appendicitis/ureterolithiasis and reevaluate. ----------------------------------------- 5:19 PM on 02/23/2024 ----------------------------------------- CT scan is reassuring, discussed incidental findings with the patient.  He is feeling improved, appropriate for discharge at  this time with analgesics, strict return precautions discussed, he agrees with this plan.       FINAL CLINICAL IMPRESSION(S) / ED DIAGNOSES   Final diagnoses:  Right lower quadrant abdominal pain     Rx / DC Orders   ED Discharge Orders          Ordered    traMADol  (ULTRAM ) 50 MG tablet  Every 6 hours PRN        02/23/24 1719             Note:  This document was prepared using Dragon voice recognition software and may include unintentional dictation errors.   Arlander Charleston, MD 02/23/24 [phone]

## 2024-02-23 NOTE — ED Notes (Signed)
Pt teaching provided on medications that may cause drowsiness. Pt instructed not to drive or operate heavy machinery while taking the prescribed medication. Pt verbalized understanding.   Pt provided discharge instructions and prescription information. Pt was given the opportunity to ask questions and questions were answered.   

## 2024-02-23 NOTE — ED Triage Notes (Signed)
 Patient to ED via POV for left sided CP that radiates into shoulder x2 days. Pt reports also having right sided abd pain. Having nausea, denies V/D. Denies cardiac hx

## 2024-03-02 ENCOUNTER — Telehealth: Payer: Self-pay

## 2024-03-02 DIAGNOSIS — F331 Major depressive disorder, recurrent, moderate: Secondary | ICD-10-CM

## 2024-03-02 DIAGNOSIS — F411 Generalized anxiety disorder: Secondary | ICD-10-CM

## 2024-03-02 NOTE — Telephone Encounter (Signed)
 Copied from CRM #[id number]. Topic: Clinical - Medication Question >> Mar 02, 2024  4:37 PM Carlyon D wrote: Reason for CRM: Pt is calling he states he was cleaning out his medicine cabinet and threw the wrong medication away  Pt is stating he is needing a new prescription for his depression meds citalopram  (CELEXA ) 20 MG tablet pt is asking if the dosage can go up to 40 mg instead of the 20 mg .  please call patient in regards to this

## 2024-03-03 NOTE — Telephone Encounter (Signed)
 No problem.  How long has he been without his citalopram  20 mg dose? What is the reason for him wanting to increase to 40 mg?

## 2024-03-03 NOTE — Telephone Encounter (Signed)
 Unable to reach patient. Left voicemail to return call to our office.

## 2024-03-04 NOTE — Telephone Encounter (Signed)
 Unable to reach patient. Left voicemail to return call to our office.

## 2024-03-05 NOTE — Telephone Encounter (Signed)
 Patient is returning call.

## 2024-03-05 NOTE — Telephone Encounter (Signed)
 Unable to reach patient. Left voicemail to return call to our office.   3rd attempt, will await patient to reach back out.

## 2024-03-08 NOTE — Telephone Encounter (Signed)
 Unable to reach patient. Left voicemail to return call to our office.

## 2024-03-08 NOTE — Addendum Note (Signed)
 Addended by: Janille Draughon K on: 03/08/2024 03:48 PM   Modules accepted: Orders

## 2024-03-08 NOTE — Telephone Encounter (Signed)
 Called and spoke with patient, he has been without the citalopram  20mg  dose for 3 days. He is requesting to increase to 40mg  due to feeling like the 20mg  dose has not been as effective. States when he previously took citalopram  he was on the 40mg  dose and responded well.

## 2024-03-08 NOTE — Telephone Encounter (Addendum)
 Which pharmacy? He has several listed on file.   I will send a prescription for the 40 mg dose. Have him start with 1/2 tablet daily x 1 week, then increase to 1 full tablet thereafter.

## 2024-03-09 MED ORDER — CITALOPRAM HYDROBROMIDE 40 MG PO TABS: 40.0000 mg | ORAL_TABLET | Freq: Every day | ORAL | 2 refills | Status: AC

## 2024-03-09 NOTE — Telephone Encounter (Signed)
 Noted.  Prescription sent to pharmacy.

## 2024-03-09 NOTE — Telephone Encounter (Signed)
 Spoke with patient, reviewed instructions for medication. Patient verbalized understanding.  Send to [facility] on [address],

## 2024-03-09 NOTE — Addendum Note (Signed)
 Addended by: Dilon Lank K on: 03/09/2024 10:36 AM   Modules accepted: Orders

## 2024-03-30 ENCOUNTER — Encounter: Payer: Self-pay | Admitting: Sports Medicine

## 2024-05-16 NOTE — Progress Notes (Deleted)
     Carl Wilbourne T. Verbon Giangregorio, MD, CAQ [facility] [address] [place], [address]  Phone: [phone]  FAX: [phone]  Carl Moore - 43 y.o. male  MRN [id number]  Date of Birth: 02-05-1981  Date: 05/17/2024  PCP: Gretta Comer POUR, NP  Referral: Gretta Comer POUR, NP  No chief complaint on file.  Subjective:   Carl Moore is a 43 y.o. very pleasant male patient with There is no height or weight on file to calculate BMI. who presents with the following:  Discussed the use of AI scribe software for clinical note transcription with the patient, who gave verbal consent to proceed.  Patient presents with a knot on his wrist. History of Present Illness     Review of Systems is noted in the HPI, as appropriate  Objective:   There were no vitals taken for this visit.  GEN: No acute distress; alert,appropriate. PULM: Breathing comfortably in no respiratory distress PSYCH: Normally interactive.   Laboratory and Imaging Data:  Assessment and Plan:   No diagnosis found. Assessment & Plan   Medication Management during today's office visit: No orders of the defined types were placed in this encounter.  There are no discontinued medications.  Orders placed today for conditions managed today: No orders of the defined types were placed in this encounter.   Disposition: No follow-ups on file.  [employer] speech-to-text software was used for transcription in this dictation.  Possible transcriptional errors can occur using Animal nutritionist.   Signed,  Jacques DASEN. Hadessah Grennan, MD   Outpatient Encounter Medications as of 05/17/2024  Medication Sig   albuterol  (VENTOLIN  HFA) 108 (90 Base) MCG/ACT inhaler Inhale 1-2 puffs into the lungs every 6 (six) hours as needed (cough).   citalopram  (CELEXA ) 40 MG tablet Take 1 tablet (40 mg total) by mouth daily. for anxiety and depression.   fluticasone  (FLONASE )  50 MCG/ACT nasal spray Place 2 sprays into both nostrils daily.   omeprazole  (PRILOSEC  OTC) 20 MG tablet Take 1 tablet (20 mg total) by mouth daily. (Patient not taking: Reported on 01/09/2024)   traMADol  (ULTRAM ) 50 MG tablet Take 1 tablet (50 mg total) by mouth every 6 (six) hours as needed.   zolpidem  (AMBIEN ) 5 MG tablet TAKE 1 TABLET BY MOUTH AT BEDTIME AS NEEDED FOR SLEEP   No facility-administered encounter medications on file as of 05/17/2024.

## 2024-05-17 ENCOUNTER — Ambulatory Visit: Admitting: Family Medicine

## 2024-05-31 ENCOUNTER — Encounter: Payer: Self-pay | Admitting: Radiology

## 2024-06-14 ENCOUNTER — Ambulatory Visit: Payer: Self-pay

## 2024-06-14 NOTE — Telephone Encounter (Signed)
 Noted. Agree with nursing triage decision. Sophronia Beams, NP evaluation.

## 2024-06-14 NOTE — Telephone Encounter (Signed)
 FYI Only or Action Required?: FYI only for provider: appointment scheduled on 06/15/24.  Patient was last seen in primary care on 01/09/2024 by Gretta Comer POUR, NP.  Called Nurse Triage reporting Shortness of Breath, Wheezing, and Cough.  Symptoms began several days ago.  Interventions attempted: OTC medications: sudafed, robitussin, mucinex , day/nyquil.  Symptoms are: unchanged.  Triage Disposition: See Physician Within 24 Hours  Patient/caregiver understands and will follow disposition?: Yes         Copied from CRM #[id number]. Topic: Clinical - Red Word Triage >> Jun 14, 2024  2:53 PM Harlene ORN wrote: Red Word that prompted transfer to Nurse Triage: chest pain, short of breathing , fever, wheezing Reason for Disposition  [1] Continuous (nonstop) coughing interferes with work or school AND [2] no improvement using cough treatment per Care Advice  Answer Assessment - Initial Assessment Questions 1. RESPIRATORY STATUS: Describe your breathing? (e.g., wheezing, shortness of breath, unable to speak, severe coughing)      SOB, wheezing, CP d/t cough, H/a 2. ONSET: When did this breathing problem begin?      4-5 days 3. PATTERN Does the difficult breathing come and go, or has it been constant since it started?      constant 4. SEVERITY: How bad is your breathing? (e.g., mild, moderate, severe)      SOB with coughing fits/wheezing. [facility] does not appreciate audible SOB/wheezing during call. Pt is speaking in full sentences.  5. RECURRENT SYMPTOM: Have you had difficulty breathing before? If Yes, ask: When was the last time? and What happened that time?      denies 6. CARDIAC HISTORY: Do you have any history of heart disease? (e.g., heart attack, angina, bypass surgery, angioplasty)      denies 7. LUNG HISTORY: Do you have any history of lung disease?  (e.g., pulmonary embolus, asthma, emphysema)     Denies, but does endorse albuterol  INH - a couple times a  day with some relief 8. CAUSE: What do you think is causing the breathing problem?      unknown 9. OTHER SYMPTOMS: Do you have any other symptoms? (e.g., chest pain, cough, dizziness, fever, runny nose)     Low grade fever - tmax 99.8 10. O2 SATURATION MONITOR:  Do you use an oxygen saturation monitor (pulse oximeter) at home? If Yes, ask: What is your reading (oxygen level) today? What is your usual oxygen saturation reading? (e.g., 95%)       N/a 11. PREGNANCY: Is there any chance you are pregnant? When was your last menstrual period?       N/a 12. TRAVEL: Have you traveled out of the country in the last month? (e.g., travel history, exposures)       N/a  Protocols used: Breathing Difficulty-A-AH, Cough - Acute Non-Productive-A-AH

## 2024-06-15 ENCOUNTER — Ambulatory Visit: Payer: Self-pay | Admitting: Family Medicine

## 2024-06-16 ENCOUNTER — Other Ambulatory Visit: Payer: Self-pay

## 2024-06-16 ENCOUNTER — Emergency Department: Payer: Self-pay

## 2024-06-16 ENCOUNTER — Emergency Department
Admission: EM | Admit: 2024-06-16 | Discharge: 2024-06-16 | Discharge status: Against Medical Advice | Payer: Self-pay | Attending: Emergency Medicine | Admitting: Emergency Medicine

## 2024-06-16 DIAGNOSIS — R519 Headache, unspecified: Secondary | ICD-10-CM | POA: Insufficient documentation

## 2024-06-16 DIAGNOSIS — R5383 Other fatigue: Secondary | ICD-10-CM | POA: Insufficient documentation

## 2024-06-16 DIAGNOSIS — R079 Chest pain, unspecified: Secondary | ICD-10-CM | POA: Insufficient documentation

## 2024-06-16 DIAGNOSIS — Z5321 Procedure and treatment not carried out due to patient leaving prior to being seen by health care provider: Secondary | ICD-10-CM | POA: Insufficient documentation

## 2024-06-16 DIAGNOSIS — R509 Fever, unspecified: Secondary | ICD-10-CM | POA: Insufficient documentation

## 2024-06-16 DIAGNOSIS — R42 Dizziness and giddiness: Secondary | ICD-10-CM | POA: Insufficient documentation

## 2024-06-16 DIAGNOSIS — J069 Acute upper respiratory infection, unspecified: Secondary | ICD-10-CM

## 2024-06-16 DIAGNOSIS — R059 Cough, unspecified: Secondary | ICD-10-CM | POA: Insufficient documentation

## 2024-06-16 LAB — COMPREHENSIVE METABOLIC PANEL WITH GFR
ALT: 77 U/L — ABNORMAL HIGH (ref 0–44)
AST: 32 U/L (ref 15–41)
Albumin: 4.8 g/dL (ref 3.5–5.0)
Alkaline Phosphatase: 137 U/L — ABNORMAL HIGH (ref 38–126)
Anion gap: 12 (ref 5–15)
BUN: 13 mg/dL (ref 6–20)
CO2: 27 mmol/L (ref 22–32)
Calcium: 9.7 mg/dL (ref 8.9–10.3)
Chloride: 100 mmol/L (ref 98–111)
Creatinine, Ser: 1.24 mg/dL (ref 0.61–1.24)
GFR, Estimated: >60 mL/min (ref >60)
Glucose, Bld: 98 mg/dL (ref 70–99)
Potassium: 5.4 mmol/L — ABNORMAL HIGH (ref 3.5–5.1)
Sodium: 139 mmol/L (ref 135–145)
Total Bilirubin: 0.6 mg/dL (ref 0.0–1.2)
Total Protein: 7.9 g/dL (ref 6.5–8.1)

## 2024-06-16 LAB — CBC
HCT: 52.5 % — ABNORMAL HIGH (ref 39.0–52.0)
Hemoglobin: 17.8 g/dL — ABNORMAL HIGH (ref 13.0–17.0)
MCH: 29.3 pg (ref 26.0–34.0)
MCHC: 33.9 g/dL (ref 30.0–36.0)
MCV: 86.5 fL (ref 80.0–100.0)
Platelets: 374 K/uL (ref 150–400)
RBC: 6.07 MIL/uL — ABNORMAL HIGH (ref 4.22–5.81)
RDW: 12.9 % (ref 11.5–15.5)
WBC: 6.9 K/uL (ref 4.0–10.5)
nRBC: 0 % (ref 0.0–0.2)

## 2024-06-16 LAB — RESP PANEL BY RT-PCR (RSV, FLU A&B, COVID)  RVPGX2
Influenza A by PCR: NEGATIVE
Influenza B by PCR: NEGATIVE
Resp Syncytial Virus by PCR: NEGATIVE
SARS Coronavirus 2 by RT PCR: NEGATIVE

## 2024-06-16 LAB — TROPONIN T, HIGH SENSITIVITY: Troponin T High Sensitivity: <15 ng/L (ref 0–19)

## 2024-06-16 NOTE — ED Provider Triage Note (Signed)
 Emergency Medicine Provider Triage Evaluation Note  Carl Moore , a 43 y.o. male  was evaluated in triage.  Pt complains of productive cough, chest pain, dizziness, fatigue, headache x 1 week.  Patient is also had a fever that started last night. No vision changes, weakness or numbness in arms or legs, vision changes.  Physical Exam  BP (!) 141/96 (BP Location: Left Arm)   Pulse 86   Temp 99.4 F (37.4 C) (Oral)   Resp 18   Ht 5' 6 (1.676 m)   Wt 74.8 kg   SpO2 100%   BMI 26.63 kg/m  Gen:   Awake, no distress   Resp:  Normal effort. Lungs CTAB. MSK:   Moves extremities without difficulty  Other:  Coughing on exam  Medical Decision Making  Medically screening exam initiated at 4:12 PM.  Appropriate orders placed.  Carl Moore was informed that the remainder of the evaluation will be completed by another provider, this initial triage assessment does not replace that evaluation, and the importance of remaining in the ED until their evaluation is complete.  Labs, CXR, EKG, resp panel ordered   Sheron Salm, PA-C 06/16/24 1615

## 2024-06-16 NOTE — ED Triage Notes (Signed)
 Productive cough with fatigue for the last week.

## 2024-06-17 ENCOUNTER — Telehealth: Payer: Self-pay | Admitting: Emergency Medicine

## 2024-06-17 NOTE — Telephone Encounter (Signed)
 Called patient due to left emergency department before completing treatment to inquire about condition and follow up plans.  Left message.

## 2024-08-21 DIAGNOSIS — G4700 Insomnia, unspecified: Secondary | ICD-10-CM
